# Patient Record
Sex: Female | Born: 1935
Health system: Southern US, Community
[De-identification: ages and names within clinical notes are randomized; demographics above are authoritative.]

## PROBLEM LIST (undated history)

## (undated) DIAGNOSIS — M8580 Other specified disorders of bone density and structure, unspecified site: Secondary | ICD-10-CM

## (undated) DIAGNOSIS — R42 Dizziness and giddiness: Secondary | ICD-10-CM

## (undated) DIAGNOSIS — K759 Inflammatory liver disease, unspecified: Secondary | ICD-10-CM

## (undated) DIAGNOSIS — N3010 Interstitial cystitis (chronic) without hematuria: Secondary | ICD-10-CM

## (undated) DIAGNOSIS — F419 Anxiety disorder, unspecified: Secondary | ICD-10-CM

## (undated) DIAGNOSIS — F32A Depression, unspecified: Secondary | ICD-10-CM

## (undated) DIAGNOSIS — L239 Allergic contact dermatitis, unspecified cause: Secondary | ICD-10-CM

## (undated) DIAGNOSIS — M1990 Unspecified osteoarthritis, unspecified site: Secondary | ICD-10-CM

## (undated) DIAGNOSIS — S7291XA Unspecified fracture of right femur, initial encounter for closed fracture: Secondary | ICD-10-CM

## (undated) DIAGNOSIS — S32591A Other specified fracture of right pubis, initial encounter for closed fracture: Secondary | ICD-10-CM

## (undated) DIAGNOSIS — F329 Major depressive disorder, single episode, unspecified: Secondary | ICD-10-CM

## (undated) DIAGNOSIS — Z79899 Other long term (current) drug therapy: Secondary | ICD-10-CM

## (undated) DIAGNOSIS — R251 Tremor, unspecified: Secondary | ICD-10-CM

## (undated) DIAGNOSIS — S72009A Fracture of unspecified part of neck of unspecified femur, initial encounter for closed fracture: Secondary | ICD-10-CM

## (undated) HISTORY — DX: Other specified fracture of right pubis, initial encounter for closed fracture: S32.591A

## (undated) HISTORY — PX: TONSILLECTOMY: SHX5217

## (undated) HISTORY — DX: Fracture of unspecified part of neck of unspecified femur, initial encounter for closed fracture: S72.009A

## (undated) HISTORY — DX: Major depressive disorder, single episode, unspecified: F32.9

## (undated) HISTORY — DX: Interstitial cystitis (chronic) without hematuria: N30.10

## (undated) HISTORY — DX: Anxiety disorder, unspecified: F41.9

## (undated) HISTORY — DX: Allergic contact dermatitis, unspecified cause: L23.9

## (undated) HISTORY — DX: Unspecified fracture of right femur, initial encounter for closed fracture: S72.91XA

## (undated) HISTORY — DX: Other specified disorders of bone density and structure, unspecified site: M85.80

## (undated) HISTORY — DX: Tremor, unspecified: R25.1

## (undated) HISTORY — DX: Dizziness and giddiness: R42

## (undated) HISTORY — DX: Inflammatory liver disease, unspecified: K75.9

## (undated) HISTORY — PX: APPENDECTOMY: SHX54

## (undated) HISTORY — DX: Unspecified osteoarthritis, unspecified site: M19.90

## (undated) HISTORY — DX: Depression, unspecified: F32.A

## (undated) HISTORY — DX: Other long term (current) drug therapy: Z79.899

---

## 2013-08-29 HISTORY — PX: FRACTURE SURGERY: SHX138

## 2013-09-03 DIAGNOSIS — N3090 Cystitis, unspecified without hematuria: Secondary | ICD-10-CM | POA: Diagnosis not present

## 2013-09-03 DIAGNOSIS — N390 Urinary tract infection, site not specified: Secondary | ICD-10-CM | POA: Diagnosis not present

## 2013-09-03 DIAGNOSIS — N3941 Urge incontinence: Secondary | ICD-10-CM | POA: Diagnosis not present

## 2013-09-16 DIAGNOSIS — I10 Essential (primary) hypertension: Secondary | ICD-10-CM | POA: Diagnosis not present

## 2013-09-16 DIAGNOSIS — R259 Unspecified abnormal involuntary movements: Secondary | ICD-10-CM | POA: Diagnosis not present

## 2013-09-16 DIAGNOSIS — R5383 Other fatigue: Secondary | ICD-10-CM | POA: Diagnosis not present

## 2013-09-16 DIAGNOSIS — R42 Dizziness and giddiness: Secondary | ICD-10-CM | POA: Diagnosis not present

## 2013-09-16 DIAGNOSIS — R5381 Other malaise: Secondary | ICD-10-CM | POA: Diagnosis not present

## 2013-09-17 DIAGNOSIS — R269 Unspecified abnormalities of gait and mobility: Secondary | ICD-10-CM | POA: Diagnosis not present

## 2013-09-17 DIAGNOSIS — E789 Disorder of lipoprotein metabolism, unspecified: Secondary | ICD-10-CM | POA: Diagnosis not present

## 2013-09-17 DIAGNOSIS — R42 Dizziness and giddiness: Secondary | ICD-10-CM | POA: Diagnosis not present

## 2013-09-17 DIAGNOSIS — Z0000 Encounter for general adult medical examination without abnormal findings: Secondary | ICD-10-CM | POA: Diagnosis not present

## 2013-09-17 DIAGNOSIS — I10 Essential (primary) hypertension: Secondary | ICD-10-CM | POA: Diagnosis not present

## 2013-09-18 DIAGNOSIS — R269 Unspecified abnormalities of gait and mobility: Secondary | ICD-10-CM | POA: Diagnosis not present

## 2013-09-18 DIAGNOSIS — Z0000 Encounter for general adult medical examination without abnormal findings: Secondary | ICD-10-CM | POA: Diagnosis not present

## 2013-09-18 DIAGNOSIS — I10 Essential (primary) hypertension: Secondary | ICD-10-CM | POA: Diagnosis not present

## 2013-09-18 DIAGNOSIS — E789 Disorder of lipoprotein metabolism, unspecified: Secondary | ICD-10-CM | POA: Diagnosis not present

## 2013-09-18 DIAGNOSIS — R42 Dizziness and giddiness: Secondary | ICD-10-CM | POA: Diagnosis not present

## 2013-09-30 DIAGNOSIS — E538 Deficiency of other specified B group vitamins: Secondary | ICD-10-CM | POA: Diagnosis not present

## 2013-09-30 DIAGNOSIS — N183 Chronic kidney disease, stage 3 unspecified: Secondary | ICD-10-CM | POA: Diagnosis not present

## 2013-09-30 DIAGNOSIS — R5381 Other malaise: Secondary | ICD-10-CM | POA: Diagnosis not present

## 2013-09-30 DIAGNOSIS — I10 Essential (primary) hypertension: Secondary | ICD-10-CM | POA: Diagnosis not present

## 2013-10-08 DIAGNOSIS — N3090 Cystitis, unspecified without hematuria: Secondary | ICD-10-CM | POA: Diagnosis not present

## 2013-10-22 HISTORY — PX: HIP ORIF W/ CAPSULOTOMY: SHX1756

## 2013-10-28 DIAGNOSIS — R5381 Other malaise: Secondary | ICD-10-CM | POA: Diagnosis not present

## 2013-10-28 DIAGNOSIS — M25559 Pain in unspecified hip: Secondary | ICD-10-CM | POA: Diagnosis not present

## 2013-10-28 DIAGNOSIS — R5383 Other fatigue: Secondary | ICD-10-CM | POA: Diagnosis not present

## 2013-10-28 DIAGNOSIS — I6529 Occlusion and stenosis of unspecified carotid artery: Secondary | ICD-10-CM | POA: Diagnosis not present

## 2013-10-28 DIAGNOSIS — R259 Unspecified abnormal involuntary movements: Secondary | ICD-10-CM | POA: Diagnosis not present

## 2013-10-29 DIAGNOSIS — S72033A Displaced midcervical fracture of unspecified femur, initial encounter for closed fracture: Secondary | ICD-10-CM | POA: Diagnosis not present

## 2013-10-29 DIAGNOSIS — R5383 Other fatigue: Secondary | ICD-10-CM | POA: Diagnosis not present

## 2013-10-29 DIAGNOSIS — S72009A Fracture of unspecified part of neck of unspecified femur, initial encounter for closed fracture: Secondary | ICD-10-CM | POA: Diagnosis not present

## 2013-10-29 DIAGNOSIS — R5381 Other malaise: Secondary | ICD-10-CM | POA: Diagnosis not present

## 2013-10-29 DIAGNOSIS — M25559 Pain in unspecified hip: Secondary | ICD-10-CM | POA: Diagnosis not present

## 2013-10-29 DIAGNOSIS — N183 Chronic kidney disease, stage 3 unspecified: Secondary | ICD-10-CM | POA: Diagnosis not present

## 2013-10-29 DIAGNOSIS — E538 Deficiency of other specified B group vitamins: Secondary | ICD-10-CM | POA: Diagnosis not present

## 2013-10-29 DIAGNOSIS — I10 Essential (primary) hypertension: Secondary | ICD-10-CM | POA: Diagnosis not present

## 2013-10-30 DIAGNOSIS — I10 Essential (primary) hypertension: Secondary | ICD-10-CM | POA: Diagnosis present

## 2013-10-30 DIAGNOSIS — M810 Age-related osteoporosis without current pathological fracture: Secondary | ICD-10-CM | POA: Diagnosis present

## 2013-10-30 DIAGNOSIS — S72033A Displaced midcervical fracture of unspecified femur, initial encounter for closed fracture: Secondary | ICD-10-CM | POA: Diagnosis not present

## 2013-10-30 DIAGNOSIS — N318 Other neuromuscular dysfunction of bladder: Secondary | ICD-10-CM | POA: Diagnosis present

## 2013-10-30 DIAGNOSIS — M25559 Pain in unspecified hip: Secondary | ICD-10-CM | POA: Diagnosis not present

## 2013-10-30 DIAGNOSIS — I2510 Atherosclerotic heart disease of native coronary artery without angina pectoris: Secondary | ICD-10-CM | POA: Diagnosis present

## 2013-10-30 DIAGNOSIS — R259 Unspecified abnormal involuntary movements: Secondary | ICD-10-CM | POA: Diagnosis not present

## 2013-10-30 DIAGNOSIS — S7290XA Unspecified fracture of unspecified femur, initial encounter for closed fracture: Secondary | ICD-10-CM | POA: Diagnosis not present

## 2013-10-30 DIAGNOSIS — S72009A Fracture of unspecified part of neck of unspecified femur, initial encounter for closed fracture: Secondary | ICD-10-CM | POA: Diagnosis not present

## 2013-10-30 DIAGNOSIS — M6281 Muscle weakness (generalized): Secondary | ICD-10-CM | POA: Diagnosis not present

## 2013-10-30 DIAGNOSIS — N289 Disorder of kidney and ureter, unspecified: Secondary | ICD-10-CM | POA: Diagnosis not present

## 2013-11-01 DIAGNOSIS — S72043A Displaced fracture of base of neck of unspecified femur, initial encounter for closed fracture: Secondary | ICD-10-CM | POA: Diagnosis not present

## 2013-11-01 DIAGNOSIS — B961 Klebsiella pneumoniae [K. pneumoniae] as the cause of diseases classified elsewhere: Secondary | ICD-10-CM | POA: Diagnosis not present

## 2013-11-01 DIAGNOSIS — R259 Unspecified abnormal involuntary movements: Secondary | ICD-10-CM | POA: Diagnosis not present

## 2013-11-01 DIAGNOSIS — N289 Disorder of kidney and ureter, unspecified: Secondary | ICD-10-CM | POA: Diagnosis not present

## 2013-11-01 DIAGNOSIS — M6281 Muscle weakness (generalized): Secondary | ICD-10-CM | POA: Diagnosis not present

## 2013-11-01 DIAGNOSIS — N3941 Urge incontinence: Secondary | ICD-10-CM | POA: Diagnosis not present

## 2013-11-01 DIAGNOSIS — I2510 Atherosclerotic heart disease of native coronary artery without angina pectoris: Secondary | ICD-10-CM | POA: Diagnosis present

## 2013-11-01 DIAGNOSIS — Z9181 History of falling: Secondary | ICD-10-CM | POA: Diagnosis not present

## 2013-11-01 DIAGNOSIS — M25559 Pain in unspecified hip: Secondary | ICD-10-CM | POA: Diagnosis not present

## 2013-11-01 DIAGNOSIS — I10 Essential (primary) hypertension: Secondary | ICD-10-CM | POA: Diagnosis present

## 2013-11-01 DIAGNOSIS — G43909 Migraine, unspecified, not intractable, without status migrainosus: Secondary | ICD-10-CM | POA: Diagnosis present

## 2013-11-01 DIAGNOSIS — S72009D Fracture of unspecified part of neck of unspecified femur, subsequent encounter for closed fracture with routine healing: Secondary | ICD-10-CM | POA: Diagnosis not present

## 2013-11-01 DIAGNOSIS — Z5189 Encounter for other specified aftercare: Secondary | ICD-10-CM | POA: Diagnosis not present

## 2013-11-01 DIAGNOSIS — G250 Essential tremor: Secondary | ICD-10-CM | POA: Diagnosis present

## 2013-11-01 DIAGNOSIS — M436 Torticollis: Secondary | ICD-10-CM | POA: Diagnosis not present

## 2013-11-01 DIAGNOSIS — K5900 Constipation, unspecified: Secondary | ICD-10-CM | POA: Diagnosis present

## 2013-11-01 DIAGNOSIS — IMO0002 Reserved for concepts with insufficient information to code with codable children: Secondary | ICD-10-CM | POA: Diagnosis not present

## 2013-11-01 DIAGNOSIS — F411 Generalized anxiety disorder: Secondary | ICD-10-CM | POA: Diagnosis not present

## 2013-11-01 DIAGNOSIS — I6529 Occlusion and stenosis of unspecified carotid artery: Secondary | ICD-10-CM | POA: Diagnosis present

## 2013-11-01 DIAGNOSIS — N318 Other neuromuscular dysfunction of bladder: Secondary | ICD-10-CM | POA: Diagnosis present

## 2013-11-01 DIAGNOSIS — E538 Deficiency of other specified B group vitamins: Secondary | ICD-10-CM | POA: Diagnosis present

## 2013-11-01 DIAGNOSIS — H8110 Benign paroxysmal vertigo, unspecified ear: Secondary | ICD-10-CM | POA: Diagnosis present

## 2013-11-01 DIAGNOSIS — N390 Urinary tract infection, site not specified: Secondary | ICD-10-CM | POA: Diagnosis not present

## 2013-11-01 DIAGNOSIS — S72019A Unspecified intracapsular fracture of unspecified femur, initial encounter for closed fracture: Secondary | ICD-10-CM | POA: Diagnosis not present

## 2013-11-01 DIAGNOSIS — M810 Age-related osteoporosis without current pathological fracture: Secondary | ICD-10-CM | POA: Diagnosis present

## 2013-11-18 DIAGNOSIS — S72043A Displaced fracture of base of neck of unspecified femur, initial encounter for closed fracture: Secondary | ICD-10-CM | POA: Diagnosis not present

## 2013-11-19 DIAGNOSIS — G250 Essential tremor: Secondary | ICD-10-CM | POA: Diagnosis not present

## 2013-11-19 DIAGNOSIS — S72009A Fracture of unspecified part of neck of unspecified femur, initial encounter for closed fracture: Secondary | ICD-10-CM | POA: Diagnosis not present

## 2013-11-19 DIAGNOSIS — I10 Essential (primary) hypertension: Secondary | ICD-10-CM | POA: Diagnosis not present

## 2013-11-19 DIAGNOSIS — M810 Age-related osteoporosis without current pathological fracture: Secondary | ICD-10-CM | POA: Diagnosis not present

## 2013-11-19 DIAGNOSIS — E539 Vitamin B deficiency, unspecified: Secondary | ICD-10-CM | POA: Diagnosis not present

## 2013-11-19 DIAGNOSIS — I6529 Occlusion and stenosis of unspecified carotid artery: Secondary | ICD-10-CM | POA: Diagnosis not present

## 2013-11-19 DIAGNOSIS — R269 Unspecified abnormalities of gait and mobility: Secondary | ICD-10-CM | POA: Diagnosis not present

## 2013-11-19 DIAGNOSIS — S72009D Fracture of unspecified part of neck of unspecified femur, subsequent encounter for closed fracture with routine healing: Secondary | ICD-10-CM | POA: Diagnosis not present

## 2013-11-19 DIAGNOSIS — F411 Generalized anxiety disorder: Secondary | ICD-10-CM | POA: Diagnosis not present

## 2013-11-20 DIAGNOSIS — I10 Essential (primary) hypertension: Secondary | ICD-10-CM | POA: Diagnosis not present

## 2013-11-20 DIAGNOSIS — F411 Generalized anxiety disorder: Secondary | ICD-10-CM | POA: Diagnosis not present

## 2013-11-20 DIAGNOSIS — I6529 Occlusion and stenosis of unspecified carotid artery: Secondary | ICD-10-CM | POA: Diagnosis not present

## 2013-11-20 DIAGNOSIS — R269 Unspecified abnormalities of gait and mobility: Secondary | ICD-10-CM | POA: Diagnosis not present

## 2013-11-20 DIAGNOSIS — M810 Age-related osteoporosis without current pathological fracture: Secondary | ICD-10-CM | POA: Diagnosis not present

## 2013-11-20 DIAGNOSIS — S72009D Fracture of unspecified part of neck of unspecified femur, subsequent encounter for closed fracture with routine healing: Secondary | ICD-10-CM | POA: Diagnosis not present

## 2013-11-21 DIAGNOSIS — F411 Generalized anxiety disorder: Secondary | ICD-10-CM | POA: Diagnosis not present

## 2013-11-21 DIAGNOSIS — S72009D Fracture of unspecified part of neck of unspecified femur, subsequent encounter for closed fracture with routine healing: Secondary | ICD-10-CM | POA: Diagnosis not present

## 2013-11-21 DIAGNOSIS — M810 Age-related osteoporosis without current pathological fracture: Secondary | ICD-10-CM | POA: Diagnosis not present

## 2013-11-21 DIAGNOSIS — I10 Essential (primary) hypertension: Secondary | ICD-10-CM | POA: Diagnosis not present

## 2013-11-21 DIAGNOSIS — R269 Unspecified abnormalities of gait and mobility: Secondary | ICD-10-CM | POA: Diagnosis not present

## 2013-11-21 DIAGNOSIS — I6529 Occlusion and stenosis of unspecified carotid artery: Secondary | ICD-10-CM | POA: Diagnosis not present

## 2013-11-22 DIAGNOSIS — S72009D Fracture of unspecified part of neck of unspecified femur, subsequent encounter for closed fracture with routine healing: Secondary | ICD-10-CM | POA: Diagnosis not present

## 2013-11-22 DIAGNOSIS — I10 Essential (primary) hypertension: Secondary | ICD-10-CM | POA: Diagnosis not present

## 2013-11-22 DIAGNOSIS — R269 Unspecified abnormalities of gait and mobility: Secondary | ICD-10-CM | POA: Diagnosis not present

## 2013-11-22 DIAGNOSIS — M810 Age-related osteoporosis without current pathological fracture: Secondary | ICD-10-CM | POA: Diagnosis not present

## 2013-11-22 DIAGNOSIS — I6529 Occlusion and stenosis of unspecified carotid artery: Secondary | ICD-10-CM | POA: Diagnosis not present

## 2013-11-22 DIAGNOSIS — F411 Generalized anxiety disorder: Secondary | ICD-10-CM | POA: Diagnosis not present

## 2013-11-26 DIAGNOSIS — I10 Essential (primary) hypertension: Secondary | ICD-10-CM | POA: Diagnosis not present

## 2013-11-26 DIAGNOSIS — S72009D Fracture of unspecified part of neck of unspecified femur, subsequent encounter for closed fracture with routine healing: Secondary | ICD-10-CM | POA: Diagnosis not present

## 2013-11-26 DIAGNOSIS — F411 Generalized anxiety disorder: Secondary | ICD-10-CM | POA: Diagnosis not present

## 2013-11-26 DIAGNOSIS — R269 Unspecified abnormalities of gait and mobility: Secondary | ICD-10-CM | POA: Diagnosis not present

## 2013-11-26 DIAGNOSIS — I6529 Occlusion and stenosis of unspecified carotid artery: Secondary | ICD-10-CM | POA: Diagnosis not present

## 2013-11-26 DIAGNOSIS — M810 Age-related osteoporosis without current pathological fracture: Secondary | ICD-10-CM | POA: Diagnosis not present

## 2013-11-27 DIAGNOSIS — S72009D Fracture of unspecified part of neck of unspecified femur, subsequent encounter for closed fracture with routine healing: Secondary | ICD-10-CM | POA: Diagnosis not present

## 2013-11-27 DIAGNOSIS — R269 Unspecified abnormalities of gait and mobility: Secondary | ICD-10-CM | POA: Diagnosis not present

## 2013-11-27 DIAGNOSIS — F411 Generalized anxiety disorder: Secondary | ICD-10-CM | POA: Diagnosis not present

## 2013-11-27 DIAGNOSIS — I6529 Occlusion and stenosis of unspecified carotid artery: Secondary | ICD-10-CM | POA: Diagnosis not present

## 2013-11-27 DIAGNOSIS — I10 Essential (primary) hypertension: Secondary | ICD-10-CM | POA: Diagnosis not present

## 2013-11-27 DIAGNOSIS — M810 Age-related osteoporosis without current pathological fracture: Secondary | ICD-10-CM | POA: Diagnosis not present

## 2013-11-28 DIAGNOSIS — R269 Unspecified abnormalities of gait and mobility: Secondary | ICD-10-CM | POA: Diagnosis not present

## 2013-11-28 DIAGNOSIS — M810 Age-related osteoporosis without current pathological fracture: Secondary | ICD-10-CM | POA: Diagnosis not present

## 2013-11-28 DIAGNOSIS — S72009D Fracture of unspecified part of neck of unspecified femur, subsequent encounter for closed fracture with routine healing: Secondary | ICD-10-CM | POA: Diagnosis not present

## 2013-11-28 DIAGNOSIS — F411 Generalized anxiety disorder: Secondary | ICD-10-CM | POA: Diagnosis not present

## 2013-11-28 DIAGNOSIS — I6529 Occlusion and stenosis of unspecified carotid artery: Secondary | ICD-10-CM | POA: Diagnosis not present

## 2013-11-28 DIAGNOSIS — I10 Essential (primary) hypertension: Secondary | ICD-10-CM | POA: Diagnosis not present

## 2013-11-29 DIAGNOSIS — M810 Age-related osteoporosis without current pathological fracture: Secondary | ICD-10-CM | POA: Diagnosis not present

## 2013-11-29 DIAGNOSIS — I6529 Occlusion and stenosis of unspecified carotid artery: Secondary | ICD-10-CM | POA: Diagnosis not present

## 2013-11-29 DIAGNOSIS — R269 Unspecified abnormalities of gait and mobility: Secondary | ICD-10-CM | POA: Diagnosis not present

## 2013-11-29 DIAGNOSIS — I10 Essential (primary) hypertension: Secondary | ICD-10-CM | POA: Diagnosis not present

## 2013-11-29 DIAGNOSIS — F411 Generalized anxiety disorder: Secondary | ICD-10-CM | POA: Diagnosis not present

## 2013-11-29 DIAGNOSIS — S72009D Fracture of unspecified part of neck of unspecified femur, subsequent encounter for closed fracture with routine healing: Secondary | ICD-10-CM | POA: Diagnosis not present

## 2013-12-02 DIAGNOSIS — I6529 Occlusion and stenosis of unspecified carotid artery: Secondary | ICD-10-CM | POA: Diagnosis not present

## 2013-12-02 DIAGNOSIS — R269 Unspecified abnormalities of gait and mobility: Secondary | ICD-10-CM | POA: Diagnosis not present

## 2013-12-02 DIAGNOSIS — F411 Generalized anxiety disorder: Secondary | ICD-10-CM | POA: Diagnosis not present

## 2013-12-02 DIAGNOSIS — I10 Essential (primary) hypertension: Secondary | ICD-10-CM | POA: Diagnosis not present

## 2013-12-02 DIAGNOSIS — S72009D Fracture of unspecified part of neck of unspecified femur, subsequent encounter for closed fracture with routine healing: Secondary | ICD-10-CM | POA: Diagnosis not present

## 2013-12-02 DIAGNOSIS — M810 Age-related osteoporosis without current pathological fracture: Secondary | ICD-10-CM | POA: Diagnosis not present

## 2013-12-03 DIAGNOSIS — S72009D Fracture of unspecified part of neck of unspecified femur, subsequent encounter for closed fracture with routine healing: Secondary | ICD-10-CM | POA: Diagnosis not present

## 2013-12-03 DIAGNOSIS — I6529 Occlusion and stenosis of unspecified carotid artery: Secondary | ICD-10-CM | POA: Diagnosis not present

## 2013-12-03 DIAGNOSIS — M810 Age-related osteoporosis without current pathological fracture: Secondary | ICD-10-CM | POA: Diagnosis not present

## 2013-12-03 DIAGNOSIS — I10 Essential (primary) hypertension: Secondary | ICD-10-CM | POA: Diagnosis not present

## 2013-12-03 DIAGNOSIS — F411 Generalized anxiety disorder: Secondary | ICD-10-CM | POA: Diagnosis not present

## 2013-12-03 DIAGNOSIS — R269 Unspecified abnormalities of gait and mobility: Secondary | ICD-10-CM | POA: Diagnosis not present

## 2013-12-05 DIAGNOSIS — S72009D Fracture of unspecified part of neck of unspecified femur, subsequent encounter for closed fracture with routine healing: Secondary | ICD-10-CM | POA: Diagnosis not present

## 2013-12-05 DIAGNOSIS — M810 Age-related osteoporosis without current pathological fracture: Secondary | ICD-10-CM | POA: Diagnosis not present

## 2013-12-05 DIAGNOSIS — I6529 Occlusion and stenosis of unspecified carotid artery: Secondary | ICD-10-CM | POA: Diagnosis not present

## 2013-12-05 DIAGNOSIS — I10 Essential (primary) hypertension: Secondary | ICD-10-CM | POA: Diagnosis not present

## 2013-12-05 DIAGNOSIS — F411 Generalized anxiety disorder: Secondary | ICD-10-CM | POA: Diagnosis not present

## 2013-12-05 DIAGNOSIS — R269 Unspecified abnormalities of gait and mobility: Secondary | ICD-10-CM | POA: Diagnosis not present

## 2013-12-06 DIAGNOSIS — S72009D Fracture of unspecified part of neck of unspecified femur, subsequent encounter for closed fracture with routine healing: Secondary | ICD-10-CM | POA: Diagnosis not present

## 2013-12-06 DIAGNOSIS — M810 Age-related osteoporosis without current pathological fracture: Secondary | ICD-10-CM | POA: Diagnosis not present

## 2013-12-06 DIAGNOSIS — F411 Generalized anxiety disorder: Secondary | ICD-10-CM | POA: Diagnosis not present

## 2013-12-06 DIAGNOSIS — I6529 Occlusion and stenosis of unspecified carotid artery: Secondary | ICD-10-CM | POA: Diagnosis not present

## 2013-12-06 DIAGNOSIS — N3040 Irradiation cystitis without hematuria: Secondary | ICD-10-CM | POA: Diagnosis not present

## 2013-12-06 DIAGNOSIS — N390 Urinary tract infection, site not specified: Secondary | ICD-10-CM | POA: Diagnosis not present

## 2013-12-06 DIAGNOSIS — I10 Essential (primary) hypertension: Secondary | ICD-10-CM | POA: Diagnosis not present

## 2013-12-06 DIAGNOSIS — R269 Unspecified abnormalities of gait and mobility: Secondary | ICD-10-CM | POA: Diagnosis not present

## 2013-12-09 DIAGNOSIS — I6529 Occlusion and stenosis of unspecified carotid artery: Secondary | ICD-10-CM | POA: Diagnosis not present

## 2013-12-09 DIAGNOSIS — F411 Generalized anxiety disorder: Secondary | ICD-10-CM | POA: Diagnosis not present

## 2013-12-09 DIAGNOSIS — M810 Age-related osteoporosis without current pathological fracture: Secondary | ICD-10-CM | POA: Diagnosis not present

## 2013-12-09 DIAGNOSIS — R269 Unspecified abnormalities of gait and mobility: Secondary | ICD-10-CM | POA: Diagnosis not present

## 2013-12-09 DIAGNOSIS — I10 Essential (primary) hypertension: Secondary | ICD-10-CM | POA: Diagnosis not present

## 2013-12-09 DIAGNOSIS — S72009D Fracture of unspecified part of neck of unspecified femur, subsequent encounter for closed fracture with routine healing: Secondary | ICD-10-CM | POA: Diagnosis not present

## 2013-12-11 DIAGNOSIS — F411 Generalized anxiety disorder: Secondary | ICD-10-CM | POA: Diagnosis not present

## 2013-12-11 DIAGNOSIS — I6529 Occlusion and stenosis of unspecified carotid artery: Secondary | ICD-10-CM | POA: Diagnosis not present

## 2013-12-11 DIAGNOSIS — S72009D Fracture of unspecified part of neck of unspecified femur, subsequent encounter for closed fracture with routine healing: Secondary | ICD-10-CM | POA: Diagnosis not present

## 2013-12-11 DIAGNOSIS — I10 Essential (primary) hypertension: Secondary | ICD-10-CM | POA: Diagnosis not present

## 2013-12-11 DIAGNOSIS — R269 Unspecified abnormalities of gait and mobility: Secondary | ICD-10-CM | POA: Diagnosis not present

## 2013-12-11 DIAGNOSIS — M810 Age-related osteoporosis without current pathological fracture: Secondary | ICD-10-CM | POA: Diagnosis not present

## 2013-12-12 DIAGNOSIS — F411 Generalized anxiety disorder: Secondary | ICD-10-CM | POA: Diagnosis not present

## 2013-12-12 DIAGNOSIS — R269 Unspecified abnormalities of gait and mobility: Secondary | ICD-10-CM | POA: Diagnosis not present

## 2013-12-12 DIAGNOSIS — I6529 Occlusion and stenosis of unspecified carotid artery: Secondary | ICD-10-CM | POA: Diagnosis not present

## 2013-12-12 DIAGNOSIS — M810 Age-related osteoporosis without current pathological fracture: Secondary | ICD-10-CM | POA: Diagnosis not present

## 2013-12-12 DIAGNOSIS — I10 Essential (primary) hypertension: Secondary | ICD-10-CM | POA: Diagnosis not present

## 2013-12-12 DIAGNOSIS — S72009D Fracture of unspecified part of neck of unspecified femur, subsequent encounter for closed fracture with routine healing: Secondary | ICD-10-CM | POA: Diagnosis not present

## 2013-12-13 DIAGNOSIS — F411 Generalized anxiety disorder: Secondary | ICD-10-CM | POA: Diagnosis not present

## 2013-12-13 DIAGNOSIS — M810 Age-related osteoporosis without current pathological fracture: Secondary | ICD-10-CM | POA: Diagnosis not present

## 2013-12-13 DIAGNOSIS — I6529 Occlusion and stenosis of unspecified carotid artery: Secondary | ICD-10-CM | POA: Diagnosis not present

## 2013-12-13 DIAGNOSIS — S72009D Fracture of unspecified part of neck of unspecified femur, subsequent encounter for closed fracture with routine healing: Secondary | ICD-10-CM | POA: Diagnosis not present

## 2013-12-13 DIAGNOSIS — R269 Unspecified abnormalities of gait and mobility: Secondary | ICD-10-CM | POA: Diagnosis not present

## 2013-12-13 DIAGNOSIS — I10 Essential (primary) hypertension: Secondary | ICD-10-CM | POA: Diagnosis not present

## 2013-12-17 DIAGNOSIS — S72043A Displaced fracture of base of neck of unspecified femur, initial encounter for closed fracture: Secondary | ICD-10-CM | POA: Diagnosis not present

## 2013-12-18 DIAGNOSIS — I10 Essential (primary) hypertension: Secondary | ICD-10-CM | POA: Diagnosis not present

## 2013-12-18 DIAGNOSIS — R269 Unspecified abnormalities of gait and mobility: Secondary | ICD-10-CM | POA: Diagnosis not present

## 2013-12-18 DIAGNOSIS — I6529 Occlusion and stenosis of unspecified carotid artery: Secondary | ICD-10-CM | POA: Diagnosis not present

## 2013-12-18 DIAGNOSIS — S72009D Fracture of unspecified part of neck of unspecified femur, subsequent encounter for closed fracture with routine healing: Secondary | ICD-10-CM | POA: Diagnosis not present

## 2013-12-18 DIAGNOSIS — F411 Generalized anxiety disorder: Secondary | ICD-10-CM | POA: Diagnosis not present

## 2013-12-18 DIAGNOSIS — M810 Age-related osteoporosis without current pathological fracture: Secondary | ICD-10-CM | POA: Diagnosis not present

## 2013-12-20 DIAGNOSIS — N390 Urinary tract infection, site not specified: Secondary | ICD-10-CM | POA: Diagnosis not present

## 2013-12-20 DIAGNOSIS — M810 Age-related osteoporosis without current pathological fracture: Secondary | ICD-10-CM | POA: Diagnosis not present

## 2013-12-20 DIAGNOSIS — R269 Unspecified abnormalities of gait and mobility: Secondary | ICD-10-CM | POA: Diagnosis not present

## 2013-12-20 DIAGNOSIS — I6529 Occlusion and stenosis of unspecified carotid artery: Secondary | ICD-10-CM | POA: Diagnosis not present

## 2013-12-20 DIAGNOSIS — N3090 Cystitis, unspecified without hematuria: Secondary | ICD-10-CM | POA: Diagnosis not present

## 2013-12-20 DIAGNOSIS — F411 Generalized anxiety disorder: Secondary | ICD-10-CM | POA: Diagnosis not present

## 2013-12-20 DIAGNOSIS — I10 Essential (primary) hypertension: Secondary | ICD-10-CM | POA: Diagnosis not present

## 2013-12-20 DIAGNOSIS — S72009D Fracture of unspecified part of neck of unspecified femur, subsequent encounter for closed fracture with routine healing: Secondary | ICD-10-CM | POA: Diagnosis not present

## 2013-12-23 DIAGNOSIS — R269 Unspecified abnormalities of gait and mobility: Secondary | ICD-10-CM | POA: Diagnosis not present

## 2013-12-23 DIAGNOSIS — F411 Generalized anxiety disorder: Secondary | ICD-10-CM | POA: Diagnosis not present

## 2013-12-23 DIAGNOSIS — I6529 Occlusion and stenosis of unspecified carotid artery: Secondary | ICD-10-CM | POA: Diagnosis not present

## 2013-12-23 DIAGNOSIS — M810 Age-related osteoporosis without current pathological fracture: Secondary | ICD-10-CM | POA: Diagnosis not present

## 2013-12-23 DIAGNOSIS — S72009D Fracture of unspecified part of neck of unspecified femur, subsequent encounter for closed fracture with routine healing: Secondary | ICD-10-CM | POA: Diagnosis not present

## 2013-12-23 DIAGNOSIS — I10 Essential (primary) hypertension: Secondary | ICD-10-CM | POA: Diagnosis not present

## 2013-12-30 DIAGNOSIS — R269 Unspecified abnormalities of gait and mobility: Secondary | ICD-10-CM | POA: Diagnosis not present

## 2013-12-30 DIAGNOSIS — M25569 Pain in unspecified knee: Secondary | ICD-10-CM | POA: Diagnosis not present

## 2013-12-30 DIAGNOSIS — E7800 Pure hypercholesterolemia, unspecified: Secondary | ICD-10-CM | POA: Diagnosis not present

## 2014-01-28 DIAGNOSIS — S72043A Displaced fracture of base of neck of unspecified femur, initial encounter for closed fracture: Secondary | ICD-10-CM | POA: Diagnosis not present

## 2014-01-28 DIAGNOSIS — R259 Unspecified abnormal involuntary movements: Secondary | ICD-10-CM | POA: Diagnosis not present

## 2014-01-28 DIAGNOSIS — R079 Chest pain, unspecified: Secondary | ICD-10-CM | POA: Diagnosis not present

## 2014-01-28 DIAGNOSIS — R5381 Other malaise: Secondary | ICD-10-CM | POA: Diagnosis not present

## 2014-01-28 DIAGNOSIS — R5383 Other fatigue: Secondary | ICD-10-CM | POA: Diagnosis not present

## 2014-01-30 DIAGNOSIS — R5383 Other fatigue: Secondary | ICD-10-CM | POA: Diagnosis not present

## 2014-01-30 DIAGNOSIS — R5381 Other malaise: Secondary | ICD-10-CM | POA: Diagnosis not present

## 2014-01-31 DIAGNOSIS — N3090 Cystitis, unspecified without hematuria: Secondary | ICD-10-CM | POA: Diagnosis not present

## 2014-01-31 DIAGNOSIS — N3941 Urge incontinence: Secondary | ICD-10-CM | POA: Diagnosis not present

## 2014-02-06 DIAGNOSIS — I658 Occlusion and stenosis of other precerebral arteries: Secondary | ICD-10-CM | POA: Diagnosis not present

## 2014-02-06 DIAGNOSIS — Z1231 Encounter for screening mammogram for malignant neoplasm of breast: Secondary | ICD-10-CM | POA: Diagnosis not present

## 2014-02-06 DIAGNOSIS — I6529 Occlusion and stenosis of unspecified carotid artery: Secondary | ICD-10-CM | POA: Diagnosis not present

## 2014-03-07 DIAGNOSIS — N3010 Interstitial cystitis (chronic) without hematuria: Secondary | ICD-10-CM | POA: Diagnosis not present

## 2014-03-07 DIAGNOSIS — N3090 Cystitis, unspecified without hematuria: Secondary | ICD-10-CM | POA: Diagnosis not present

## 2014-03-07 DIAGNOSIS — N3941 Urge incontinence: Secondary | ICD-10-CM | POA: Diagnosis not present

## 2014-03-10 DIAGNOSIS — N390 Urinary tract infection, site not specified: Secondary | ICD-10-CM | POA: Diagnosis not present

## 2014-03-10 DIAGNOSIS — N3010 Interstitial cystitis (chronic) without hematuria: Secondary | ICD-10-CM | POA: Diagnosis not present

## 2014-03-13 DIAGNOSIS — R4181 Age-related cognitive decline: Secondary | ICD-10-CM | POA: Diagnosis not present

## 2014-03-13 DIAGNOSIS — R634 Abnormal weight loss: Secondary | ICD-10-CM | POA: Diagnosis not present

## 2014-03-13 DIAGNOSIS — R259 Unspecified abnormal involuntary movements: Secondary | ICD-10-CM | POA: Diagnosis not present

## 2014-03-19 DIAGNOSIS — I6529 Occlusion and stenosis of unspecified carotid artery: Secondary | ICD-10-CM | POA: Diagnosis not present

## 2014-04-18 DIAGNOSIS — N3941 Urge incontinence: Secondary | ICD-10-CM | POA: Diagnosis not present

## 2014-04-18 DIAGNOSIS — N3090 Cystitis, unspecified without hematuria: Secondary | ICD-10-CM | POA: Diagnosis not present

## 2014-04-23 DIAGNOSIS — R209 Unspecified disturbances of skin sensation: Secondary | ICD-10-CM | POA: Diagnosis not present

## 2014-05-01 DIAGNOSIS — R209 Unspecified disturbances of skin sensation: Secondary | ICD-10-CM | POA: Diagnosis not present

## 2014-05-01 DIAGNOSIS — Z79899 Other long term (current) drug therapy: Secondary | ICD-10-CM | POA: Diagnosis not present

## 2014-05-09 DIAGNOSIS — R209 Unspecified disturbances of skin sensation: Secondary | ICD-10-CM | POA: Diagnosis not present

## 2014-05-16 DIAGNOSIS — R209 Unspecified disturbances of skin sensation: Secondary | ICD-10-CM | POA: Diagnosis not present

## 2014-05-21 DIAGNOSIS — IMO0002 Reserved for concepts with insufficient information to code with codable children: Secondary | ICD-10-CM | POA: Diagnosis not present

## 2014-05-27 DIAGNOSIS — R209 Unspecified disturbances of skin sensation: Secondary | ICD-10-CM | POA: Diagnosis not present

## 2014-05-27 DIAGNOSIS — R918 Other nonspecific abnormal finding of lung field: Secondary | ICD-10-CM | POA: Diagnosis not present

## 2014-05-27 DIAGNOSIS — IMO0002 Reserved for concepts with insufficient information to code with codable children: Secondary | ICD-10-CM | POA: Diagnosis not present

## 2014-05-27 DIAGNOSIS — R079 Chest pain, unspecified: Secondary | ICD-10-CM | POA: Diagnosis not present

## 2014-05-27 DIAGNOSIS — J449 Chronic obstructive pulmonary disease, unspecified: Secondary | ICD-10-CM | POA: Diagnosis not present

## 2014-05-29 DIAGNOSIS — Z23 Encounter for immunization: Secondary | ICD-10-CM | POA: Diagnosis not present

## 2014-05-30 DIAGNOSIS — N3010 Interstitial cystitis (chronic) without hematuria: Secondary | ICD-10-CM | POA: Diagnosis not present

## 2014-05-30 DIAGNOSIS — N3941 Urge incontinence: Secondary | ICD-10-CM | POA: Diagnosis not present

## 2014-07-11 DIAGNOSIS — N3941 Urge incontinence: Secondary | ICD-10-CM | POA: Diagnosis not present

## 2014-07-11 DIAGNOSIS — N3090 Cystitis, unspecified without hematuria: Secondary | ICD-10-CM | POA: Diagnosis not present

## 2014-07-11 DIAGNOSIS — N3000 Acute cystitis without hematuria: Secondary | ICD-10-CM | POA: Diagnosis not present

## 2014-07-11 DIAGNOSIS — N3010 Interstitial cystitis (chronic) without hematuria: Secondary | ICD-10-CM | POA: Diagnosis not present

## 2014-07-15 DIAGNOSIS — R251 Tremor, unspecified: Secondary | ICD-10-CM | POA: Diagnosis not present

## 2014-09-12 DIAGNOSIS — N3941 Urge incontinence: Secondary | ICD-10-CM | POA: Diagnosis not present

## 2014-09-12 DIAGNOSIS — N3090 Cystitis, unspecified without hematuria: Secondary | ICD-10-CM | POA: Diagnosis not present

## 2014-09-12 DIAGNOSIS — N3010 Interstitial cystitis (chronic) without hematuria: Secondary | ICD-10-CM | POA: Diagnosis not present

## 2014-09-24 DIAGNOSIS — Z0000 Encounter for general adult medical examination without abnormal findings: Secondary | ICD-10-CM | POA: Diagnosis not present

## 2014-09-24 DIAGNOSIS — R079 Chest pain, unspecified: Secondary | ICD-10-CM | POA: Diagnosis not present

## 2014-09-24 DIAGNOSIS — M810 Age-related osteoporosis without current pathological fracture: Secondary | ICD-10-CM | POA: Diagnosis not present

## 2014-09-24 DIAGNOSIS — G250 Essential tremor: Secondary | ICD-10-CM | POA: Diagnosis not present

## 2014-09-24 DIAGNOSIS — N183 Chronic kidney disease, stage 3 (moderate): Secondary | ICD-10-CM | POA: Diagnosis not present

## 2014-09-27 DIAGNOSIS — Z23 Encounter for immunization: Secondary | ICD-10-CM | POA: Diagnosis not present

## 2014-10-03 DIAGNOSIS — Z1211 Encounter for screening for malignant neoplasm of colon: Secondary | ICD-10-CM | POA: Diagnosis not present

## 2014-10-08 DIAGNOSIS — R42 Dizziness and giddiness: Secondary | ICD-10-CM | POA: Diagnosis not present

## 2014-10-08 DIAGNOSIS — R270 Ataxia, unspecified: Secondary | ICD-10-CM | POA: Diagnosis not present

## 2014-10-08 DIAGNOSIS — R531 Weakness: Secondary | ICD-10-CM | POA: Diagnosis not present

## 2014-10-10 DIAGNOSIS — R270 Ataxia, unspecified: Secondary | ICD-10-CM | POA: Diagnosis not present

## 2014-10-10 DIAGNOSIS — R42 Dizziness and giddiness: Secondary | ICD-10-CM | POA: Diagnosis not present

## 2014-10-10 DIAGNOSIS — R531 Weakness: Secondary | ICD-10-CM | POA: Diagnosis not present

## 2014-10-13 DIAGNOSIS — R270 Ataxia, unspecified: Secondary | ICD-10-CM | POA: Diagnosis not present

## 2014-10-13 DIAGNOSIS — R42 Dizziness and giddiness: Secondary | ICD-10-CM | POA: Diagnosis not present

## 2014-10-13 DIAGNOSIS — R531 Weakness: Secondary | ICD-10-CM | POA: Diagnosis not present

## 2014-10-16 DIAGNOSIS — I10 Essential (primary) hypertension: Secondary | ICD-10-CM | POA: Diagnosis not present

## 2014-10-16 DIAGNOSIS — E780 Pure hypercholesterolemia: Secondary | ICD-10-CM | POA: Diagnosis not present

## 2014-10-16 DIAGNOSIS — G009 Bacterial meningitis, unspecified: Secondary | ICD-10-CM | POA: Diagnosis not present

## 2014-10-21 DIAGNOSIS — R531 Weakness: Secondary | ICD-10-CM | POA: Diagnosis not present

## 2014-10-21 DIAGNOSIS — R270 Ataxia, unspecified: Secondary | ICD-10-CM | POA: Diagnosis not present

## 2014-10-21 DIAGNOSIS — R42 Dizziness and giddiness: Secondary | ICD-10-CM | POA: Diagnosis not present

## 2014-10-23 DIAGNOSIS — R030 Elevated blood-pressure reading, without diagnosis of hypertension: Secondary | ICD-10-CM | POA: Diagnosis not present

## 2014-10-23 DIAGNOSIS — R42 Dizziness and giddiness: Secondary | ICD-10-CM | POA: Diagnosis not present

## 2014-10-23 DIAGNOSIS — K921 Melena: Secondary | ICD-10-CM | POA: Diagnosis not present

## 2014-11-13 DIAGNOSIS — R195 Other fecal abnormalities: Secondary | ICD-10-CM | POA: Diagnosis not present

## 2014-11-13 DIAGNOSIS — R194 Change in bowel habit: Secondary | ICD-10-CM | POA: Diagnosis not present

## 2014-11-13 DIAGNOSIS — K5900 Constipation, unspecified: Secondary | ICD-10-CM | POA: Diagnosis not present

## 2014-11-14 DIAGNOSIS — N3941 Urge incontinence: Secondary | ICD-10-CM | POA: Diagnosis not present

## 2014-11-14 DIAGNOSIS — N3091 Cystitis, unspecified with hematuria: Secondary | ICD-10-CM | POA: Diagnosis not present

## 2014-11-25 DIAGNOSIS — L2389 Allergic contact dermatitis due to other agents: Secondary | ICD-10-CM | POA: Diagnosis not present

## 2014-11-25 DIAGNOSIS — L298 Other pruritus: Secondary | ICD-10-CM | POA: Diagnosis not present

## 2014-11-25 DIAGNOSIS — L2081 Atopic neurodermatitis: Secondary | ICD-10-CM | POA: Diagnosis not present

## 2014-11-25 DIAGNOSIS — L233 Allergic contact dermatitis due to drugs in contact with skin: Secondary | ICD-10-CM | POA: Diagnosis not present

## 2014-11-25 DIAGNOSIS — L2084 Intrinsic (allergic) eczema: Secondary | ICD-10-CM | POA: Diagnosis not present

## 2014-11-26 DIAGNOSIS — R351 Nocturia: Secondary | ICD-10-CM | POA: Diagnosis not present

## 2014-11-26 DIAGNOSIS — R3916 Straining to void: Secondary | ICD-10-CM | POA: Diagnosis not present

## 2014-11-26 DIAGNOSIS — N390 Urinary tract infection, site not specified: Secondary | ICD-10-CM | POA: Diagnosis not present

## 2014-11-26 DIAGNOSIS — N3090 Cystitis, unspecified without hematuria: Secondary | ICD-10-CM | POA: Diagnosis not present

## 2014-11-26 DIAGNOSIS — N3281 Overactive bladder: Secondary | ICD-10-CM | POA: Diagnosis not present

## 2014-11-26 DIAGNOSIS — R3915 Urgency of urination: Secondary | ICD-10-CM | POA: Diagnosis not present

## 2014-11-26 DIAGNOSIS — R3914 Feeling of incomplete bladder emptying: Secondary | ICD-10-CM | POA: Diagnosis not present

## 2014-11-27 DIAGNOSIS — N390 Urinary tract infection, site not specified: Secondary | ICD-10-CM | POA: Diagnosis not present

## 2014-11-28 DIAGNOSIS — N390 Urinary tract infection, site not specified: Secondary | ICD-10-CM | POA: Diagnosis not present

## 2014-12-19 DIAGNOSIS — N390 Urinary tract infection, site not specified: Secondary | ICD-10-CM | POA: Diagnosis not present

## 2015-01-01 DIAGNOSIS — N3941 Urge incontinence: Secondary | ICD-10-CM | POA: Diagnosis not present

## 2015-01-01 DIAGNOSIS — N390 Urinary tract infection, site not specified: Secondary | ICD-10-CM | POA: Diagnosis not present

## 2015-01-01 DIAGNOSIS — N3090 Cystitis, unspecified without hematuria: Secondary | ICD-10-CM | POA: Diagnosis not present

## 2015-01-02 DIAGNOSIS — N3090 Cystitis, unspecified without hematuria: Secondary | ICD-10-CM | POA: Diagnosis not present

## 2015-01-02 DIAGNOSIS — N3010 Interstitial cystitis (chronic) without hematuria: Secondary | ICD-10-CM | POA: Diagnosis not present

## 2015-01-05 DIAGNOSIS — N3011 Interstitial cystitis (chronic) with hematuria: Secondary | ICD-10-CM | POA: Diagnosis not present

## 2015-01-05 DIAGNOSIS — N3091 Cystitis, unspecified with hematuria: Secondary | ICD-10-CM | POA: Diagnosis not present

## 2015-01-06 DIAGNOSIS — N3010 Interstitial cystitis (chronic) without hematuria: Secondary | ICD-10-CM | POA: Diagnosis not present

## 2015-01-06 DIAGNOSIS — N3090 Cystitis, unspecified without hematuria: Secondary | ICD-10-CM | POA: Diagnosis not present

## 2015-01-07 DIAGNOSIS — N3090 Cystitis, unspecified without hematuria: Secondary | ICD-10-CM | POA: Diagnosis not present

## 2015-01-07 DIAGNOSIS — N3010 Interstitial cystitis (chronic) without hematuria: Secondary | ICD-10-CM | POA: Diagnosis not present

## 2015-01-08 DIAGNOSIS — N3010 Interstitial cystitis (chronic) without hematuria: Secondary | ICD-10-CM | POA: Diagnosis not present

## 2015-01-08 DIAGNOSIS — N3090 Cystitis, unspecified without hematuria: Secondary | ICD-10-CM | POA: Diagnosis not present

## 2015-01-09 DIAGNOSIS — N3090 Cystitis, unspecified without hematuria: Secondary | ICD-10-CM | POA: Diagnosis not present

## 2015-01-09 DIAGNOSIS — N3010 Interstitial cystitis (chronic) without hematuria: Secondary | ICD-10-CM | POA: Diagnosis not present

## 2015-01-20 DIAGNOSIS — N3091 Cystitis, unspecified with hematuria: Secondary | ICD-10-CM | POA: Diagnosis not present

## 2015-01-21 DIAGNOSIS — N3091 Cystitis, unspecified with hematuria: Secondary | ICD-10-CM | POA: Diagnosis not present

## 2015-01-23 DIAGNOSIS — N3010 Interstitial cystitis (chronic) without hematuria: Secondary | ICD-10-CM | POA: Diagnosis not present

## 2015-01-23 DIAGNOSIS — N3090 Cystitis, unspecified without hematuria: Secondary | ICD-10-CM | POA: Diagnosis not present

## 2015-01-27 DIAGNOSIS — J301 Allergic rhinitis due to pollen: Secondary | ICD-10-CM | POA: Diagnosis not present

## 2015-01-27 DIAGNOSIS — N3011 Interstitial cystitis (chronic) with hematuria: Secondary | ICD-10-CM | POA: Diagnosis not present

## 2015-01-27 DIAGNOSIS — N3090 Cystitis, unspecified without hematuria: Secondary | ICD-10-CM | POA: Diagnosis not present

## 2015-01-27 DIAGNOSIS — N3091 Cystitis, unspecified with hematuria: Secondary | ICD-10-CM | POA: Diagnosis not present

## 2015-01-30 DIAGNOSIS — N3010 Interstitial cystitis (chronic) without hematuria: Secondary | ICD-10-CM | POA: Diagnosis not present

## 2015-02-11 DIAGNOSIS — L209 Atopic dermatitis, unspecified: Secondary | ICD-10-CM | POA: Diagnosis not present

## 2015-02-11 DIAGNOSIS — L501 Idiopathic urticaria: Secondary | ICD-10-CM | POA: Diagnosis not present

## 2015-02-11 DIAGNOSIS — J301 Allergic rhinitis due to pollen: Secondary | ICD-10-CM | POA: Diagnosis not present

## 2015-02-24 DIAGNOSIS — N3010 Interstitial cystitis (chronic) without hematuria: Secondary | ICD-10-CM | POA: Diagnosis not present

## 2015-02-24 DIAGNOSIS — N3941 Urge incontinence: Secondary | ICD-10-CM | POA: Diagnosis not present

## 2015-02-24 DIAGNOSIS — N3090 Cystitis, unspecified without hematuria: Secondary | ICD-10-CM | POA: Diagnosis not present

## 2015-02-24 DIAGNOSIS — N390 Urinary tract infection, site not specified: Secondary | ICD-10-CM | POA: Diagnosis not present

## 2015-03-04 DIAGNOSIS — Z1231 Encounter for screening mammogram for malignant neoplasm of breast: Secondary | ICD-10-CM | POA: Diagnosis not present

## 2015-03-04 DIAGNOSIS — M8588 Other specified disorders of bone density and structure, other site: Secondary | ICD-10-CM | POA: Diagnosis not present

## 2015-03-17 DIAGNOSIS — N3090 Cystitis, unspecified without hematuria: Secondary | ICD-10-CM | POA: Diagnosis not present

## 2015-03-17 DIAGNOSIS — N3010 Interstitial cystitis (chronic) without hematuria: Secondary | ICD-10-CM | POA: Diagnosis not present

## 2015-03-23 DIAGNOSIS — N3090 Cystitis, unspecified without hematuria: Secondary | ICD-10-CM | POA: Diagnosis not present

## 2015-03-23 DIAGNOSIS — N3010 Interstitial cystitis (chronic) without hematuria: Secondary | ICD-10-CM | POA: Diagnosis not present

## 2015-03-25 DIAGNOSIS — L2389 Allergic contact dermatitis due to other agents: Secondary | ICD-10-CM | POA: Diagnosis not present

## 2015-03-25 DIAGNOSIS — L2084 Intrinsic (allergic) eczema: Secondary | ICD-10-CM | POA: Diagnosis not present

## 2015-03-25 DIAGNOSIS — L2081 Atopic neurodermatitis: Secondary | ICD-10-CM | POA: Diagnosis not present

## 2015-03-25 DIAGNOSIS — L298 Other pruritus: Secondary | ICD-10-CM | POA: Diagnosis not present

## 2015-03-25 DIAGNOSIS — L820 Inflamed seborrheic keratosis: Secondary | ICD-10-CM | POA: Diagnosis not present

## 2015-03-25 DIAGNOSIS — L233 Allergic contact dermatitis due to drugs in contact with skin: Secondary | ICD-10-CM | POA: Diagnosis not present

## 2015-04-17 DIAGNOSIS — R310 Gross hematuria: Secondary | ICD-10-CM | POA: Diagnosis not present

## 2015-04-29 ENCOUNTER — Non-Acute Institutional Stay: Payer: Medicare Other | Admitting: Internal Medicine

## 2015-04-29 ENCOUNTER — Encounter: Payer: Self-pay | Admitting: Internal Medicine

## 2015-04-29 VITALS — BP 100/62 | HR 84 | Temp 98.2°F | Ht 61.5 in | Wt 100.0 lb

## 2015-04-29 DIAGNOSIS — K5900 Constipation, unspecified: Secondary | ICD-10-CM

## 2015-04-29 DIAGNOSIS — N390 Urinary tract infection, site not specified: Secondary | ICD-10-CM

## 2015-04-29 DIAGNOSIS — K5909 Other constipation: Secondary | ICD-10-CM | POA: Insufficient documentation

## 2015-04-29 DIAGNOSIS — G250 Essential tremor: Secondary | ICD-10-CM | POA: Diagnosis not present

## 2015-04-29 DIAGNOSIS — A630 Anogenital (venereal) warts: Secondary | ICD-10-CM

## 2015-04-29 DIAGNOSIS — R2681 Unsteadiness on feet: Secondary | ICD-10-CM | POA: Diagnosis not present

## 2015-04-29 DIAGNOSIS — L239 Allergic contact dermatitis, unspecified cause: Secondary | ICD-10-CM | POA: Diagnosis not present

## 2015-04-29 DIAGNOSIS — M810 Age-related osteoporosis without current pathological fracture: Secondary | ICD-10-CM | POA: Diagnosis not present

## 2015-04-29 DIAGNOSIS — N3281 Overactive bladder: Secondary | ICD-10-CM | POA: Diagnosis not present

## 2015-04-29 DIAGNOSIS — N950 Postmenopausal bleeding: Secondary | ICD-10-CM | POA: Insufficient documentation

## 2015-04-29 DIAGNOSIS — G4700 Insomnia, unspecified: Secondary | ICD-10-CM

## 2015-04-29 DIAGNOSIS — Z66 Do not resuscitate: Secondary | ICD-10-CM | POA: Diagnosis not present

## 2015-04-29 NOTE — Progress Notes (Signed)
Patient ID: Melanie Freeman, female   DOB: [birth year], 81 y.o.   MRN: [id number]   Location:  [facility]  Code Status: DNR--discussed today and pt requests Do NOT resuscitate status  Goals of Care:Advanced Directive information Does patient have an advance directive?: Yes, Type of Advance Directive: Out of facility DNR (pink MOST or yellow form), Pre-existing out of facility DNR order (yellow form or pink MOST form): Yellow form placed in chart (order not valid for inpatient use), Does patient want to make changes to advanced directive?: No - Patient declined  Chief Complaint  Patient presents with  . Medical Management of Chronic Issues    New Patient to get est. Here with daughter Eustaquio Maize  . Establish Care    HPI: Patient is a 81 y.o. white female seen in the [facility] today to establish with me and to review her chronic medical conditions.    New to [facility] from [place], [place].    Her daughter is here with her and notes she has some memory loss.  Says she has a UTI and went to urologist before she left [place].  Gets them all of the time.  Is at the end of the prescription for amoxicillin 500mg  po tid and 250mg  at bedtime.  Marland Kitchen  Has not filled new Rx yet.   She has a new Rx for ampicillin 250mg  po tid for 7 days.  She also has a Rx for myrbetriq 50mg  daily which she says she's been taking it for a while.     As a child, she was thought to be allergic to penicillins.   Has taken many different Rxs as treatment for UTIs, but never preventive.  Needs urology referral.  Has suprapubic abdominal pain.  Is improved at the moment vs. Early this am.  No dysuria.  No chills.  No increased frequency.  Sometimes has urge, but cannot pass her urine.  No nocturia.  Also has allergic contact dermatitis from polyester.  Was given a name of a dermatologist here.  Has tubes of medication she uses for itching.    Only Rx other than the tubes is raloxifene 60mg  daily--takes for  "osteopenia" but paperwork indicates osteoporosis.  Denies other meds before.    Vaginal bleeding:  1.5 wks ago, 1 episode.  She was at [employer], went to the bathroom and had some bleeding.  Went to urgent care.   Had already been taking the amoxicillin.  Had already stopped.    Has not recurred.  Did not have a reaction when they gave her the briefs b/c she tucked her shirt in, she says.  It was bright red blood.  She does have hemorrhoids--says one is in the front and one in the rectal area.  They do not hurt.    Has dizziness and balance problems since feb of last year.  There's a slope outside of her door at her garden home.  Does not want to use an old-timey walker that she has.  Is using her mother's that has wheels despite clinic nurse having told her not to do this due to fall risk with it rolling away--pt says it has brakes and she knows how to use them.  Otherwise walks with cane.  Agrees to PT to evaluate her walking.    Appetite:  She is very thin.  Pt says she eats well.  Lost weight when she fractured her hip last year--was 10 lbs more, but apparently dropped  from size 8 to size 4 petite.  Sometimes calls and gets food from dining room.  Says too far to walk so drives her car over there.  May not have been eating well until her car got fixed.  Her daughter says she does not eat three meals per day.  Eats half of lunch at lunch and half at dinner.  Loves sweets.  Counseled on increasing protein in her diet.    Insomnia:  Sleep is disrupted. Wakens really early and cannot go back to sleep, then sleeps all afternoon.  At one time, she took melatonin. Didn't feel like the melatonin helped her.  Has not restarted it since trying to go to bed at the same time, get up at the same time, etc.  Her son in law did flip the mattress which helped a little.  Discussed that exercise would help.    Tremor:  Went to neurologist recently.  Involves hands and head.  Dr. Gloriann Loan had seen her.    Had  prevnar in Jan 09/27/14.  Says the shot was given into her bone on her left shoulder and she has trouble moving her shoulder since.    Had hepatitis 50 years ago when she ate something.  Was too sick to care for her baby.    Has osteoarthritis:  Born and raised in [place]fell on ice there.  Had problems with left knee after moved to [facility].  Got shots in her left knee.  Didn't bother her again until after she broke her left hip.  Uses tylenol arthritis for that.  Both knees hurt sometimes.  Years ago had xrays, but none lately.  May have cataracts.  Wears glasses.  Says she was tested for macular degeneration and it was negative. Has the grid to check her eyes.  Says she had them tested just before coming and has had same Rx for 3 years.    Hears well.  Still drives.  Has to pass driver's test here in [place].     Review of Systems:  Review of Systems  Constitutional: Positive for weight loss and malaise/fatigue. Negative for fever and chills.  HENT: Negative for congestion and hearing loss.   Eyes: Negative for blurred vision.       Glasses  Respiratory: Negative for cough and shortness of breath.   Cardiovascular: Negative for chest pain, palpitations and leg swelling.  Gastrointestinal: Negative for heartburn, abdominal pain, constipation, blood in stool and melena.  Genitourinary: Positive for urgency and frequency. Negative for dysuria and hematuria.  Musculoskeletal: Positive for joint pain and falls.       Left hip, bilateral knees  Skin: Positive for itching and rash.       Allergic contact dermatitis--says to polyester  Neurological: Positive for weakness and headaches. Negative for dizziness, sensory change, focal weakness and loss of consciousness.  Endo/Heme/Allergies: Does not bruise/bleed easily.  Psychiatric/Behavioral: Positive for memory loss. Negative for depression. The patient has insomnia.     Past Medical History  Diagnosis Date  . Cystitis, interstitial   . Tremors of  nervous system   . Allergic contact dermatitis   . Vertigo   . Arthritis   . Osteopenia   . Hepatitis     over 50 years ago    Past Surgical History  Procedure Laterality Date  . Tonsillectomy    . Appendectomy    . Hip orif w/ capsulotomy Left 10/22/2013    [name]     Social History:  reports that she has never smoked. She has never used smokeless tobacco. She reports that she does not drink alcohol or use illicit drugs.  Allergies  Allergen Reactions  . Ciprofloxacin   . Doxycycline   . Levaquin [Levofloxacin In D5w]   . Lopid [Gemfibrozil]   . Macrobid [employer] Macro]   . Sulfa Antibiotics     Medications: Patient's Medications  New Prescriptions   No medications on file  Previous Medications   AMOXICILLIN (AMOXIL) 500 MG CAPSULE    Take 500 mg by mouth 3 (three) times daily. For urinary infection   CALCIUM CARB-CHOLECALCIFEROL (CALCIUM 600+D3) 600-800 MG-UNIT TABS    Take by mouth. Take one tablet daily   CHOLECALCIFEROL (D3 HIGH POTENCY) 1000 UNITS CAPSULE    Take 1,000 Units by mouth. Take one tablet daily   FLUOCINOLONE (VANOS) 0.01 % CREAM    Apply topically. Apply on skin 1-2 times a day as needed for skin allergy   MAGNESIUM 400 MG CAPS    Take by mouth. Take one tablet daily   MIRABEGRON ER (MYRBETRIQ) 50 MG TB24 TABLET    Take 50 mg by mouth daily. Take one tablet daily for bladder   OMEGA-3 FATTY ACIDS (FISH OIL) 1200 MG CAPS    Take by mouth. Take one tablet twice daily   PIMECROLIMUS (ELIDEL) 1 % CREAM    Apply topically. Apply on skin 1-2 times a day as needed   POLYETHYLENE GLYCOL (MIRALAX / GLYCOLAX) PACKET    Take 17 g by mouth. Use as needed   RALOXIFENE (EVISTA) 60 MG TABLET    Take 60 mg by mouth. Take one table daily   TACROLIMUS (PROTOPIC) 0.1 % OINTMENT    Apply topically. Apply to skin as needed   VITAMIN B-12 (CYANOCOBALAMIN) 1000 MCG TABLET    Take 1,000 mcg by mouth. Take one tablet daily  Modified Medications   No  medications on file  Discontinued Medications   No medications on file     Physical Exam: Filed Vitals:   04/29/15 1109  BP: 100/62  Pulse: 84  Temp: 98.2 F (36.8 C)  TempSrc: Oral  Height: 5' 1.5" (1.562 m)  Weight: 100 lb (45.36 kg)  SpO2: 99%   Body mass index is 18.59 kg/(m^2). Physical Exam  Constitutional: No distress.  Frail white female  HENT:  Head: Normocephalic and atraumatic.  Eyes:  glasses  Cardiovascular: Normal rate, regular rhythm, normal heart sounds and intact distal pulses.   Pulmonary/Chest: Effort normal and breath sounds normal. No respiratory distress.  Abdominal: Soft. Bowel sounds are normal. She exhibits no distension. There is no tenderness.  Musculoskeletal: Normal range of motion.  Very unsteady, drunk-like gait  Neurological: She is alert. She exhibits abnormal muscle tone.  Tremor of bilateral hands; she is pleasant but has poor short term memory--gets mixed up frequently  Skin: Skin is warm and dry. Rash noted.  Psychiatric: She has a normal mood and affect.     Labs reviewed: Basic Metabolic Panel: No results for input(s): NA, K, CL, CO2, GLUCOSE, BUN, CREATININE, CALCIUM, MG, PHOS, TSH in the last 8760 hours. Liver Function Tests: No results for input(s): AST, ALT, ALKPHOS, BILITOT, PROT, ALBUMIN in the last 8760 hours. No results for input(s): LIPASE, AMYLASE in the last 8760 hours. No results for input(s): AMMONIA in the last 8760 hours. CBC: No results for input(s): WBC, NEUTROABS, HGB, HCT, MCV, PLT in the last 8760 hours. Lipid Panel: No results for input(s): CHOL, HDL,  LDLCALC, TRIG, CHOLHDL, LDLDIRECT in the last 8760 hours. No results found for: HGBA1C  Patient Care Team: Gayland Curry, DO as PCP - General (Geriatric Medicine)  Assessment/Plan 1. Postmenopausal vaginal bleeding - was seen by clinic nurse who noted it was spotting, not outright bleeding -unclear if it came from the wart lesions seen today or  internally or from hemorrhoids, will refer for further eval due to this and #2--pt thinks she has hemorrhoids up front - Ambulatory referral to Gynecology  2. Genital warts -appears this is what she has anteriorly on labia and some of the perirectal lesions also look more like warts than hemorrhoids to me--could not get a reasonable history from the patient about duration they've been present - Ambulatory referral to Gynecology  3. Recurrent UTIs -per patient, need better records, but her last urologist put her on amoxicillin that she's been taking and gave her more to take here, had not been on prophylaxis until this last month -current symptoms do not sound like infection to me with only mild suprapubic pain and no dysuria, frequency, urgency, fever, back pain, etc. - Ambulatory referral to Urology  4. Overactive bladder - seems she does not fully empty her bladder at times -her urologist from [facility] just started her on myrbetriq from what I can tell, but she says "i've been on it for years"--unclear, await records - Ambulatory referral to Urology  5. Unsteady gait -has "drunken" type of ataxic gait -unsteady, uses can and wall/grab bars or other people to walk and unsafely using a rolling walker in her sloped driveway -awaits an independent living apt in place of garden home  6. Allergic contact dermatitis due to multiple agents -continues fluocinolone and elidel cream -says she's not using the tacrolimus ointment  7. Benign essential tremor -cont to monitor--says she's allergic to all of the possible meds for treatment--unclear if this is true  8. Senile osteoporosis -cont vitamin D 1000 units daily and ca with D daily as well as raloxifene for the time being -need records to learn when last bone density was and if she ever took other meds for this  9. Chronic constipation -uses miralax as needed, but denied constipation today  10. Insomnia -discussed use of melatonin which she  says didn't work, but it sounds like use has been inconsistent  More than 75 minutes were spent reviewing her history with her and her daughter, reviewing records, determining code status and advance directives, pill bottles and also examining her including brief pelvic exam.  Records were also requested from her PCP in [place].  Labs/tests ordered:  PT, urology, gynecology referrals Next appt:  1 month to f/u on today's concerns  Yanin Muhlestein L. Zeriah Baysinger, D.O. [facility] [address], [place] [address] Cell Phone (Mon-Fri 8am-5pm):  [phone] On Call:  [phone] & follow prompts after 5pm & weekends Office Phone:  [phone] Office Fax:  [phone]

## 2015-05-11 DIAGNOSIS — M6281 Muscle weakness (generalized): Secondary | ICD-10-CM | POA: Diagnosis not present

## 2015-05-11 DIAGNOSIS — M25512 Pain in left shoulder: Secondary | ICD-10-CM | POA: Diagnosis not present

## 2015-05-11 DIAGNOSIS — G3184 Mild cognitive impairment, so stated: Secondary | ICD-10-CM | POA: Diagnosis not present

## 2015-05-11 DIAGNOSIS — R4189 Other symptoms and signs involving cognitive functions and awareness: Secondary | ICD-10-CM | POA: Diagnosis not present

## 2015-05-11 DIAGNOSIS — Z9181 History of falling: Secondary | ICD-10-CM | POA: Diagnosis not present

## 2015-05-11 DIAGNOSIS — M170 Bilateral primary osteoarthritis of knee: Secondary | ICD-10-CM | POA: Diagnosis not present

## 2015-05-11 DIAGNOSIS — R2681 Unsteadiness on feet: Secondary | ICD-10-CM | POA: Diagnosis not present

## 2015-05-11 DIAGNOSIS — R278 Other lack of coordination: Secondary | ICD-10-CM | POA: Diagnosis not present

## 2015-05-11 DIAGNOSIS — R296 Repeated falls: Secondary | ICD-10-CM | POA: Diagnosis not present

## 2015-05-12 DIAGNOSIS — M170 Bilateral primary osteoarthritis of knee: Secondary | ICD-10-CM | POA: Diagnosis not present

## 2015-05-12 DIAGNOSIS — G3184 Mild cognitive impairment, so stated: Secondary | ICD-10-CM | POA: Diagnosis not present

## 2015-05-12 DIAGNOSIS — R4189 Other symptoms and signs involving cognitive functions and awareness: Secondary | ICD-10-CM | POA: Diagnosis not present

## 2015-05-12 DIAGNOSIS — M25512 Pain in left shoulder: Secondary | ICD-10-CM | POA: Diagnosis not present

## 2015-05-12 DIAGNOSIS — R2681 Unsteadiness on feet: Secondary | ICD-10-CM | POA: Diagnosis not present

## 2015-05-12 DIAGNOSIS — M6281 Muscle weakness (generalized): Secondary | ICD-10-CM | POA: Diagnosis not present

## 2015-05-14 DIAGNOSIS — G3184 Mild cognitive impairment, so stated: Secondary | ICD-10-CM | POA: Diagnosis not present

## 2015-05-14 DIAGNOSIS — M170 Bilateral primary osteoarthritis of knee: Secondary | ICD-10-CM | POA: Diagnosis not present

## 2015-05-14 DIAGNOSIS — R2681 Unsteadiness on feet: Secondary | ICD-10-CM | POA: Diagnosis not present

## 2015-05-14 DIAGNOSIS — R4189 Other symptoms and signs involving cognitive functions and awareness: Secondary | ICD-10-CM | POA: Diagnosis not present

## 2015-05-14 DIAGNOSIS — M6281 Muscle weakness (generalized): Secondary | ICD-10-CM | POA: Diagnosis not present

## 2015-05-14 DIAGNOSIS — M25512 Pain in left shoulder: Secondary | ICD-10-CM | POA: Diagnosis not present

## 2015-05-15 DIAGNOSIS — R2681 Unsteadiness on feet: Secondary | ICD-10-CM | POA: Diagnosis not present

## 2015-05-15 DIAGNOSIS — M6281 Muscle weakness (generalized): Secondary | ICD-10-CM | POA: Diagnosis not present

## 2015-05-15 DIAGNOSIS — M25512 Pain in left shoulder: Secondary | ICD-10-CM | POA: Diagnosis not present

## 2015-05-15 DIAGNOSIS — R4189 Other symptoms and signs involving cognitive functions and awareness: Secondary | ICD-10-CM | POA: Diagnosis not present

## 2015-05-15 DIAGNOSIS — G3184 Mild cognitive impairment, so stated: Secondary | ICD-10-CM | POA: Diagnosis not present

## 2015-05-15 DIAGNOSIS — M170 Bilateral primary osteoarthritis of knee: Secondary | ICD-10-CM | POA: Diagnosis not present

## 2015-05-18 DIAGNOSIS — M170 Bilateral primary osteoarthritis of knee: Secondary | ICD-10-CM | POA: Diagnosis not present

## 2015-05-18 DIAGNOSIS — G3184 Mild cognitive impairment, so stated: Secondary | ICD-10-CM | POA: Diagnosis not present

## 2015-05-18 DIAGNOSIS — R4189 Other symptoms and signs involving cognitive functions and awareness: Secondary | ICD-10-CM | POA: Diagnosis not present

## 2015-05-18 DIAGNOSIS — M25512 Pain in left shoulder: Secondary | ICD-10-CM | POA: Diagnosis not present

## 2015-05-18 DIAGNOSIS — M6281 Muscle weakness (generalized): Secondary | ICD-10-CM | POA: Diagnosis not present

## 2015-05-18 DIAGNOSIS — R2681 Unsteadiness on feet: Secondary | ICD-10-CM | POA: Diagnosis not present

## 2015-05-19 DIAGNOSIS — M25512 Pain in left shoulder: Secondary | ICD-10-CM | POA: Diagnosis not present

## 2015-05-19 DIAGNOSIS — M6281 Muscle weakness (generalized): Secondary | ICD-10-CM | POA: Diagnosis not present

## 2015-05-19 DIAGNOSIS — M170 Bilateral primary osteoarthritis of knee: Secondary | ICD-10-CM | POA: Diagnosis not present

## 2015-05-19 DIAGNOSIS — G3184 Mild cognitive impairment, so stated: Secondary | ICD-10-CM | POA: Diagnosis not present

## 2015-05-19 DIAGNOSIS — R2681 Unsteadiness on feet: Secondary | ICD-10-CM | POA: Diagnosis not present

## 2015-05-19 DIAGNOSIS — R4189 Other symptoms and signs involving cognitive functions and awareness: Secondary | ICD-10-CM | POA: Diagnosis not present

## 2015-05-21 DIAGNOSIS — N950 Postmenopausal bleeding: Secondary | ICD-10-CM | POA: Diagnosis not present

## 2015-05-22 DIAGNOSIS — M170 Bilateral primary osteoarthritis of knee: Secondary | ICD-10-CM | POA: Diagnosis not present

## 2015-05-22 DIAGNOSIS — R4189 Other symptoms and signs involving cognitive functions and awareness: Secondary | ICD-10-CM | POA: Diagnosis not present

## 2015-05-22 DIAGNOSIS — R2681 Unsteadiness on feet: Secondary | ICD-10-CM | POA: Diagnosis not present

## 2015-05-22 DIAGNOSIS — G3184 Mild cognitive impairment, so stated: Secondary | ICD-10-CM | POA: Diagnosis not present

## 2015-05-22 DIAGNOSIS — M25512 Pain in left shoulder: Secondary | ICD-10-CM | POA: Diagnosis not present

## 2015-05-22 DIAGNOSIS — M6281 Muscle weakness (generalized): Secondary | ICD-10-CM | POA: Diagnosis not present

## 2015-05-25 DIAGNOSIS — M170 Bilateral primary osteoarthritis of knee: Secondary | ICD-10-CM | POA: Diagnosis not present

## 2015-05-25 DIAGNOSIS — M6281 Muscle weakness (generalized): Secondary | ICD-10-CM | POA: Diagnosis not present

## 2015-05-25 DIAGNOSIS — R2681 Unsteadiness on feet: Secondary | ICD-10-CM | POA: Diagnosis not present

## 2015-05-25 DIAGNOSIS — G3184 Mild cognitive impairment, so stated: Secondary | ICD-10-CM | POA: Diagnosis not present

## 2015-05-25 DIAGNOSIS — M25512 Pain in left shoulder: Secondary | ICD-10-CM | POA: Diagnosis not present

## 2015-05-25 DIAGNOSIS — R4189 Other symptoms and signs involving cognitive functions and awareness: Secondary | ICD-10-CM | POA: Diagnosis not present

## 2015-05-26 DIAGNOSIS — R2681 Unsteadiness on feet: Secondary | ICD-10-CM | POA: Diagnosis not present

## 2015-05-26 DIAGNOSIS — R4189 Other symptoms and signs involving cognitive functions and awareness: Secondary | ICD-10-CM | POA: Diagnosis not present

## 2015-05-26 DIAGNOSIS — G3184 Mild cognitive impairment, so stated: Secondary | ICD-10-CM | POA: Diagnosis not present

## 2015-05-26 DIAGNOSIS — M25512 Pain in left shoulder: Secondary | ICD-10-CM | POA: Diagnosis not present

## 2015-05-26 DIAGNOSIS — M170 Bilateral primary osteoarthritis of knee: Secondary | ICD-10-CM | POA: Diagnosis not present

## 2015-05-26 DIAGNOSIS — M6281 Muscle weakness (generalized): Secondary | ICD-10-CM | POA: Diagnosis not present

## 2015-05-28 DIAGNOSIS — G3184 Mild cognitive impairment, so stated: Secondary | ICD-10-CM | POA: Diagnosis not present

## 2015-05-28 DIAGNOSIS — R4189 Other symptoms and signs involving cognitive functions and awareness: Secondary | ICD-10-CM | POA: Diagnosis not present

## 2015-05-28 DIAGNOSIS — M6281 Muscle weakness (generalized): Secondary | ICD-10-CM | POA: Diagnosis not present

## 2015-05-28 DIAGNOSIS — R2681 Unsteadiness on feet: Secondary | ICD-10-CM | POA: Diagnosis not present

## 2015-05-28 DIAGNOSIS — M170 Bilateral primary osteoarthritis of knee: Secondary | ICD-10-CM | POA: Diagnosis not present

## 2015-05-28 DIAGNOSIS — M25512 Pain in left shoulder: Secondary | ICD-10-CM | POA: Diagnosis not present

## 2015-05-29 DIAGNOSIS — M6281 Muscle weakness (generalized): Secondary | ICD-10-CM | POA: Diagnosis not present

## 2015-05-29 DIAGNOSIS — M25512 Pain in left shoulder: Secondary | ICD-10-CM | POA: Diagnosis not present

## 2015-05-29 DIAGNOSIS — G3184 Mild cognitive impairment, so stated: Secondary | ICD-10-CM | POA: Diagnosis not present

## 2015-05-29 DIAGNOSIS — R4189 Other symptoms and signs involving cognitive functions and awareness: Secondary | ICD-10-CM | POA: Diagnosis not present

## 2015-05-29 DIAGNOSIS — R2681 Unsteadiness on feet: Secondary | ICD-10-CM | POA: Diagnosis not present

## 2015-05-29 DIAGNOSIS — M170 Bilateral primary osteoarthritis of knee: Secondary | ICD-10-CM | POA: Diagnosis not present

## 2015-06-01 DIAGNOSIS — G3184 Mild cognitive impairment, so stated: Secondary | ICD-10-CM | POA: Diagnosis not present

## 2015-06-01 DIAGNOSIS — R4189 Other symptoms and signs involving cognitive functions and awareness: Secondary | ICD-10-CM | POA: Diagnosis not present

## 2015-06-01 DIAGNOSIS — R278 Other lack of coordination: Secondary | ICD-10-CM | POA: Diagnosis not present

## 2015-06-01 DIAGNOSIS — M6281 Muscle weakness (generalized): Secondary | ICD-10-CM | POA: Diagnosis not present

## 2015-06-01 DIAGNOSIS — Z9181 History of falling: Secondary | ICD-10-CM | POA: Diagnosis not present

## 2015-06-01 DIAGNOSIS — R2681 Unsteadiness on feet: Secondary | ICD-10-CM | POA: Diagnosis not present

## 2015-06-01 DIAGNOSIS — R296 Repeated falls: Secondary | ICD-10-CM | POA: Diagnosis not present

## 2015-06-01 DIAGNOSIS — M170 Bilateral primary osteoarthritis of knee: Secondary | ICD-10-CM | POA: Diagnosis not present

## 2015-06-01 DIAGNOSIS — M25512 Pain in left shoulder: Secondary | ICD-10-CM | POA: Diagnosis not present

## 2015-06-02 DIAGNOSIS — N840 Polyp of corpus uteri: Secondary | ICD-10-CM | POA: Diagnosis not present

## 2015-06-02 DIAGNOSIS — N950 Postmenopausal bleeding: Secondary | ICD-10-CM | POA: Diagnosis not present

## 2015-06-03 ENCOUNTER — Non-Acute Institutional Stay: Payer: Medicare Other | Admitting: Internal Medicine

## 2015-06-03 ENCOUNTER — Encounter: Payer: Self-pay | Admitting: Internal Medicine

## 2015-06-03 VITALS — BP 126/72 | HR 88 | Temp 98.1°F | Wt 109.0 lb

## 2015-06-03 DIAGNOSIS — N3281 Overactive bladder: Secondary | ICD-10-CM | POA: Diagnosis not present

## 2015-06-03 DIAGNOSIS — N950 Postmenopausal bleeding: Secondary | ICD-10-CM

## 2015-06-03 DIAGNOSIS — G6289 Other specified polyneuropathies: Secondary | ICD-10-CM | POA: Diagnosis not present

## 2015-06-03 DIAGNOSIS — L723 Sebaceous cyst: Secondary | ICD-10-CM | POA: Diagnosis not present

## 2015-06-03 DIAGNOSIS — M25512 Pain in left shoulder: Secondary | ICD-10-CM | POA: Diagnosis not present

## 2015-06-03 DIAGNOSIS — R2681 Unsteadiness on feet: Secondary | ICD-10-CM | POA: Diagnosis not present

## 2015-06-03 DIAGNOSIS — M6281 Muscle weakness (generalized): Secondary | ICD-10-CM | POA: Diagnosis not present

## 2015-06-03 DIAGNOSIS — G3184 Mild cognitive impairment, so stated: Secondary | ICD-10-CM | POA: Diagnosis not present

## 2015-06-03 DIAGNOSIS — N907 Vulvar cyst: Secondary | ICD-10-CM

## 2015-06-03 DIAGNOSIS — M170 Bilateral primary osteoarthritis of knee: Secondary | ICD-10-CM | POA: Diagnosis not present

## 2015-06-03 DIAGNOSIS — R4189 Other symptoms and signs involving cognitive functions and awareness: Secondary | ICD-10-CM | POA: Diagnosis not present

## 2015-06-03 NOTE — Progress Notes (Signed)
Location: [facility]  Code Status: DNR Goals of Care: Advanced Directive information     Chief Complaint  Patient presents with  . Medical Management of Chronic Issues    here with daughter Eustaquio Maize    HPI: Patient is a 81 y.o. white female seen in the office today for follow up of chronic medical issues.   She has been to OB/GYN and they found a polyp in her uterus via sonohistogram. She is going to have a hysteroscope to remove the polyp and resolve the bleeding. Cysts were also found around her labia and rectum.   She continues to have some malaise and fatigue. She isn't sleeping well, but she did have a good night of sleep early. She is waking frequently. She is up frequently to go to the bathroom- last night 6-7 times. Denies urgency, dysuria, incontinence. Daughter notes that when they are out and about she is unable to void urine, but when at home voids ok. Patient feels that its hard to go if someone is waiting for her. She has an appointment with Dr. Matilde Sprang on October 13th.   She has gained nine pounds since last visit. This is good news. She is still eating half meals, but adding some protein, she had cheese with her lunch today.   She has arthritis in her knees. Takes Tylenol arthritis with some relief. She has been seen by orthopedic doctor on [place]. She is using a cane, and a walker at home. She feels like she needs to see an orthopedist again.  Notes neuropathy in her feet, onset just before moving. She has not tried any medication. Does interfere with gait at times. She has also been seeing PT and they noted she is a low/moderate fall risk and have evaluated her for a new walker that she can lift to get into the car. She does not know what has caused her neuropathy. She has had it ever since she had her hip surgery in 2015.   She was seen by occupational therapy for left shoulder pain. Notes this is helping with pain and helping getting her clothes on.   She  went to [facility] and was able to get a restricted license and isn't able to drive on the highway. She only drives to [address] and to the main [facility] building.  Review of Systems:  Review of Systems  Constitutional: Negative for fever and weight loss.  Respiratory: Negative for cough and hemoptysis.   Cardiovascular: Negative for chest pain and palpitations.  Gastrointestinal: Negative for nausea, vomiting, abdominal pain and diarrhea.  Genitourinary: Positive for frequency. Negative for dysuria.  Musculoskeletal: Positive for joint pain. Negative for falls.  Neurological: Positive for dizziness, tingling and tremors.  Endo/Heme/Allergies: Bruises/bleeds easily.  Psychiatric/Behavioral: Negative for depression. The patient has insomnia.     Past Medical History  Diagnosis Date  . Cystitis, interstitial   . Tremors of nervous system   . Allergic contact dermatitis   . Vertigo   . Arthritis   . Osteopenia   . Hepatitis     over 50 years ago    Past Surgical History  Procedure Laterality Date  . Tonsillectomy    . Appendectomy    . Hip orif w/ capsulotomy Left 10/22/2013    [name]     Allergies  Allergen Reactions  . Ciprofloxacin   . Doxycycline   . Levaquin [Levofloxacin In D5w]   . Lopid [Gemfibrozil]   . Macrobid [employer]  Macro]   . Sulfa Antibiotics    Medications: Patient's Medications  New Prescriptions   No medications on file  Previous Medications   CALCIUM CARB-CHOLECALCIFEROL (CALCIUM 600+D3) 600-800 MG-UNIT TABS    Take by mouth. Take one tablet daily   CHOLECALCIFEROL (D3 HIGH POTENCY) 1000 UNITS CAPSULE    Take 1,000 Units by mouth. Take one tablet daily   FLUOCINOLONE (VANOS) 0.01 % CREAM    Apply topically. Apply on skin 1-2 times a day as needed for skin allergy   MAGNESIUM 400 MG CAPS    Take by mouth. Take one tablet daily   MIRABEGRON ER (MYRBETRIQ) 50 MG TB24 TABLET    Take 50 mg by mouth daily. Take one tablet daily  for bladder   OMEGA-3 FATTY ACIDS (FISH OIL) 1200 MG CAPS    Take by mouth. Take one tablet twice daily   PIMECROLIMUS (ELIDEL) 1 % CREAM    Apply topically. Apply on skin 1-2 times a day as needed   POLYETHYLENE GLYCOL (MIRALAX / GLYCOLAX) PACKET    Take 17 g by mouth. Use as needed   RALOXIFENE (EVISTA) 60 MG TABLET    Take 60 mg by mouth. Take one table daily   TACROLIMUS (PROTOPIC) 0.1 % OINTMENT    Apply topically. Apply to skin as needed   VITAMIN B-12 (CYANOCOBALAMIN) 1000 MCG TABLET    Take 1,000 mcg by mouth. Take one tablet daily  Modified Medications   No medications on file  Discontinued Medications   No medications on file    Physical Exam: Filed Vitals:   06/03/15 1548  BP: 126/72  Pulse: 88  Temp: 98.1 F (36.7 C)  TempSrc: Oral  Weight: 109 lb (49.442 kg)  SpO2: 99%   Physical Exam  Constitutional: No distress.  Frail white female  HENT:  Head: Normocephalic and atraumatic.  Eyes:  glasses  Cardiovascular: Normal rate, regular rhythm, normal heart sounds and intact distal pulses.   Pulmonary/Chest: Effort normal and breath sounds normal. No respiratory distress.  Abdominal: Soft. Bowel sounds are normal. She exhibits no distension. There is no tenderness.  Musculoskeletal:  Unsteady, wobbling gait, decreased active and passive ROM in left shoulder Decreased grip strength in left hand  Neurological: She is alert.  Tremor of bilateral hands and head Bilateral feet with pain sensation intact  Skin: Skin is warm and dry.  Psychiatric: She has a normal mood and affect.      Assessment/Plan 1. Sebaceous cyst of labia Recently diagnosed by OB/GYN. No treatment indicated.   2. Postmenopausal vaginal bleeding Found to be caused by a uterine polyp by OB/GYN. She is going to be scheduled for a hysteroscopy.  3. Unsteady gait Appears drunk-like. Likely related to nerve damage from hip surgery and leg-length discrepancy. Continue OT/PT. PT is working on  getting patient an appropriate walker.  4. Overactive bladder Myrbetriq doesn't seem to be helping, and dosage is at max. Patient has appointment with urology in October and appreciate their help with this problem.  5. Other polyneuropathy ([place]) Likely related to nerve damage from hip surgery. She is not a candidate for management with Lyrica at this point due to not wanting to increase her gait instability. Will continue to monitor and may consider a low dose Lyrica in the future is PT is able to increase her stability.    Labs/tests ordered: Next appt: 2 months with Dr. Mariea Clonts for follow up of medical management of chronic issues.  Mariana Kaufman, RN, BSN Student-  Nurse Practitioner- [facility] [address], [place] [address] Office Phone:  [phone] Office Fax:  [phone]

## 2015-06-05 DIAGNOSIS — M170 Bilateral primary osteoarthritis of knee: Secondary | ICD-10-CM | POA: Diagnosis not present

## 2015-06-05 DIAGNOSIS — M6281 Muscle weakness (generalized): Secondary | ICD-10-CM | POA: Diagnosis not present

## 2015-06-05 DIAGNOSIS — R4189 Other symptoms and signs involving cognitive functions and awareness: Secondary | ICD-10-CM | POA: Diagnosis not present

## 2015-06-05 DIAGNOSIS — M25512 Pain in left shoulder: Secondary | ICD-10-CM | POA: Diagnosis not present

## 2015-06-05 DIAGNOSIS — G3184 Mild cognitive impairment, so stated: Secondary | ICD-10-CM | POA: Diagnosis not present

## 2015-06-05 DIAGNOSIS — R2681 Unsteadiness on feet: Secondary | ICD-10-CM | POA: Diagnosis not present

## 2015-06-10 DIAGNOSIS — R4189 Other symptoms and signs involving cognitive functions and awareness: Secondary | ICD-10-CM | POA: Diagnosis not present

## 2015-06-10 DIAGNOSIS — R2681 Unsteadiness on feet: Secondary | ICD-10-CM | POA: Diagnosis not present

## 2015-06-10 DIAGNOSIS — M6281 Muscle weakness (generalized): Secondary | ICD-10-CM | POA: Diagnosis not present

## 2015-06-10 DIAGNOSIS — M25512 Pain in left shoulder: Secondary | ICD-10-CM | POA: Diagnosis not present

## 2015-06-10 DIAGNOSIS — G3184 Mild cognitive impairment, so stated: Secondary | ICD-10-CM | POA: Diagnosis not present

## 2015-06-10 DIAGNOSIS — M170 Bilateral primary osteoarthritis of knee: Secondary | ICD-10-CM | POA: Diagnosis not present

## 2015-06-11 DIAGNOSIS — N3020 Other chronic cystitis without hematuria: Secondary | ICD-10-CM | POA: Diagnosis not present

## 2015-06-11 DIAGNOSIS — N390 Urinary tract infection, site not specified: Secondary | ICD-10-CM | POA: Diagnosis not present

## 2015-06-11 DIAGNOSIS — R350 Frequency of micturition: Secondary | ICD-10-CM | POA: Diagnosis not present

## 2015-06-11 DIAGNOSIS — R351 Nocturia: Secondary | ICD-10-CM | POA: Diagnosis not present

## 2015-06-12 DIAGNOSIS — M6281 Muscle weakness (generalized): Secondary | ICD-10-CM | POA: Diagnosis not present

## 2015-06-12 DIAGNOSIS — G3184 Mild cognitive impairment, so stated: Secondary | ICD-10-CM | POA: Diagnosis not present

## 2015-06-12 DIAGNOSIS — M25512 Pain in left shoulder: Secondary | ICD-10-CM | POA: Diagnosis not present

## 2015-06-12 DIAGNOSIS — M170 Bilateral primary osteoarthritis of knee: Secondary | ICD-10-CM | POA: Diagnosis not present

## 2015-06-12 DIAGNOSIS — R2681 Unsteadiness on feet: Secondary | ICD-10-CM | POA: Diagnosis not present

## 2015-06-12 DIAGNOSIS — R4189 Other symptoms and signs involving cognitive functions and awareness: Secondary | ICD-10-CM | POA: Diagnosis not present

## 2015-06-15 DIAGNOSIS — R4189 Other symptoms and signs involving cognitive functions and awareness: Secondary | ICD-10-CM | POA: Diagnosis not present

## 2015-06-15 DIAGNOSIS — N3020 Other chronic cystitis without hematuria: Secondary | ICD-10-CM | POA: Diagnosis not present

## 2015-06-15 DIAGNOSIS — M6281 Muscle weakness (generalized): Secondary | ICD-10-CM | POA: Diagnosis not present

## 2015-06-15 DIAGNOSIS — M170 Bilateral primary osteoarthritis of knee: Secondary | ICD-10-CM | POA: Diagnosis not present

## 2015-06-15 DIAGNOSIS — G3184 Mild cognitive impairment, so stated: Secondary | ICD-10-CM | POA: Diagnosis not present

## 2015-06-15 DIAGNOSIS — M25512 Pain in left shoulder: Secondary | ICD-10-CM | POA: Diagnosis not present

## 2015-06-15 DIAGNOSIS — R2681 Unsteadiness on feet: Secondary | ICD-10-CM | POA: Diagnosis not present

## 2015-06-15 NOTE — H&P (Signed)
NAME:  Melanie Freeman, JOFFE NO.:  [id number]  MEDICAL RECORD NO.:  [id number]  LOCATION:                                 FACILITY:  PHYSICIAN:  [name], M.D.        DATE OF BIRTH:  DATE OF ADMISSION: DATE OF DISCHARGE:                             HISTORY & PHYSICAL   DATE OF SURGERY:  June 26, 2015.  HISTORY OF PRESENT ILLNESS:  The patient is an 81 year old postmenopausal patient, who was evaluated here in the office for postmenopausal bleeding.  On saline infusion ultrasound, she did have an 8.6 mm endometrial polyp.  The endometrial thickness was not overly thickened, but because of the polyp, she now comes in for hysteroscopy with MyoSure and dilation and curettage.  ALLERGIES:  SHE IS ALLERGIC TO CIPROFLOXACIN, DOXYCYCLINE, LEVAQUIN, LOPID, MACROBID, AND SULFA ANTIBIOTICS.  MEDICATIONS:  She is on calcium replacement, vitamin D, fluocinolone 0.01% cream, magnesium supplementation, Elidel 1% cream, and MiraLAX.  PAST MEDICAL HISTORY:  She has a history of interstitial cystitis.  She has vertigo, arthritis, osteopenia, and past history of hepatitis.  PAST SURGICAL HISTORY:  She has had her tonsils removed, appendectomy, and hip surgery.  SOCIAL HISTORY:  No tobacco or alcohol use.  FAMILY HISTORY:  Noncontributory.  REVIEW OF SYSTEMS:  Noncontributory.  PHYSICAL EXAMINATION:  VITAL SIGNS:  The patient is afebrile, stable vital signs. HEENT:  The patient is normocephalic.  Pupils are equal, round, and reactive to light and accommodation.  Extraocular movements were intact. Sclerae and conjunctivae were clear.  Oropharynx clear. LUNGS:  Clear. CARDIOVASCULAR:  Regular rhythm and rate without murmurs or gallops. ABDOMEN:  Benign.  No mass, organomegaly, or tenderness. PELVIC:  Normal external genitalia.  Vaginal mucosa atrophic.  Cervix unremarkable.  Uterus appeared to be of normal size and shape.  Adnexae unremarkable. EXTREMITIES:  Trace  edema. NEUROLOGIC:  Grossly within normal limits.  IMPRESSION:  Postmenopausal bleeding with evidence of endometrial polyp.  PLAN:  The patient will to undergo hysteroscopy with MyoSure resection of the polyp.  She will also have dilation and curettage.  The risks of surgery have been explained including the risk of infection.  The risk of hemorrhage that could require transfusion with the risk of AIDS or hepatitis.  Excessive bleeding could require hysterectomy.  There is a risk of perforation with injury to adjacent organs that could require further exploratory surgery.  Risk of deep venous thrombosis and pulmonary embolus.  The patient expressed understanding of the potential risks and complications.     [name], M.D.     JSM/MEDQ  D:  06/15/2015  T:  06/15/2015  Job:  [id number]

## 2015-06-15 NOTE — H&P (Signed)
  Patient name  VESTA, WHEELAND DICTATION# [id number] CSN# [id number]  [name], MD 06/15/2015 10:03 AM

## 2015-06-17 DIAGNOSIS — M25512 Pain in left shoulder: Secondary | ICD-10-CM | POA: Diagnosis not present

## 2015-06-17 DIAGNOSIS — M6281 Muscle weakness (generalized): Secondary | ICD-10-CM | POA: Diagnosis not present

## 2015-06-17 DIAGNOSIS — R2681 Unsteadiness on feet: Secondary | ICD-10-CM | POA: Diagnosis not present

## 2015-06-17 DIAGNOSIS — R4189 Other symptoms and signs involving cognitive functions and awareness: Secondary | ICD-10-CM | POA: Diagnosis not present

## 2015-06-17 DIAGNOSIS — G3184 Mild cognitive impairment, so stated: Secondary | ICD-10-CM | POA: Diagnosis not present

## 2015-06-17 DIAGNOSIS — M170 Bilateral primary osteoarthritis of knee: Secondary | ICD-10-CM | POA: Diagnosis not present

## 2015-06-18 DIAGNOSIS — Z23 Encounter for immunization: Secondary | ICD-10-CM | POA: Diagnosis not present

## 2015-06-18 NOTE — H&P (Signed)
NAME:  Melanie Freeman, Melanie Freeman NO.:  [id number]  MEDICAL RECORD NO.:  [id number]  LOCATION:                                 FACILITY:  PHYSICIAN:  [name], M.D.        DATE OF BIRTH:  DATE OF ADMISSION: DATE OF DISCHARGE:                             HISTORY & PHYSICAL   Date of her surgery is June 26, 2015, at [facility] here in [place].  The patient is an 81 year old postmenopausal patient, who presented to our office with postmenopausal bleeding.  Saline infusion ultrasound did reveal an endometrial polyp.  She now presents for hysteroscopic resection of the endometrial polyp.  ALLERGIES:  In terms of allergies, she has no known drug allergies.  MEDICATIONS:  She is on Evista, Myrbetriq, calcium, and supplements.  PAST MEDICAL HISTORY:  She has a history of arthritis, urinary tract infections, history of hepatitis in the past.  PAST SURGICAL HISTORY:  She has had previous hip surgery.  SOCIAL HISTORY:  No tobacco or alcohol use.  REVIEW OF SYSTEMS:  Noncontributory.  PHYSICAL EXAMINATION:  VITAL SIGNS:  The patient is afebrile.  Stable vital signs. HEENT:  The patient is normocephalic.  Pupils equal, round, and reactive to light and accommodation.  Extraocular movements were intact.  Sclerae and conjunctivae were clear.  Oropharynx clear. NECK:  Without thyromegaly. BREASTS:  Not examined. LUNGS:  Clear. CARDIOVASCULAR:  Regular rhythm and rate.  There are no murmurs or gallops. ABDOMEN:  Benign.  No mass, organomegaly, or tenderness. PELVIC:  Normal external genitalia.  Vaginal mucosa is clear.  Cervix unremarkable.  Uterus normal size, shape, and contour.  Adnexa free of mass or tenderness. EXTREMITIES:  Trace edema. NEUROLOGIC:  Grossly within normal limits.  IMPRESSION:  Postmenopausal bleeding with endometrial polyp.  PLAN:  The patient will undergo hysteroscopy with D and C.  The risks of surgery have been discussed  including the risk of infection.  The risk of hemorrhage that could require transfusion with the risk of AIDS or hepatitis.  Risk of perforation with injury to adjacent organs requiring further exploratory surgery.  Risk of deep venous thrombosis and pulmonary embolus.  The patient expressed understanding of potential risks and complications as well as indications.     [name], M.D.     JSM/MEDQ  D:  06/18/2015  T:  06/18/2015  Job:  [id number]

## 2015-06-18 NOTE — H&P (Signed)
  Patient name Melanie Freeman, Aron DICTATION# [id number] CSN# [id number]  [name], MD 06/18/2015 5:02 PM

## 2015-06-19 DIAGNOSIS — M6281 Muscle weakness (generalized): Secondary | ICD-10-CM | POA: Diagnosis not present

## 2015-06-19 DIAGNOSIS — M25512 Pain in left shoulder: Secondary | ICD-10-CM | POA: Diagnosis not present

## 2015-06-19 DIAGNOSIS — M170 Bilateral primary osteoarthritis of knee: Secondary | ICD-10-CM | POA: Diagnosis not present

## 2015-06-19 DIAGNOSIS — R4189 Other symptoms and signs involving cognitive functions and awareness: Secondary | ICD-10-CM | POA: Diagnosis not present

## 2015-06-19 DIAGNOSIS — G3184 Mild cognitive impairment, so stated: Secondary | ICD-10-CM | POA: Diagnosis not present

## 2015-06-19 DIAGNOSIS — R2681 Unsteadiness on feet: Secondary | ICD-10-CM | POA: Diagnosis not present

## 2015-06-22 DIAGNOSIS — M170 Bilateral primary osteoarthritis of knee: Secondary | ICD-10-CM | POA: Diagnosis not present

## 2015-06-22 DIAGNOSIS — R2681 Unsteadiness on feet: Secondary | ICD-10-CM | POA: Diagnosis not present

## 2015-06-22 DIAGNOSIS — R4189 Other symptoms and signs involving cognitive functions and awareness: Secondary | ICD-10-CM | POA: Diagnosis not present

## 2015-06-22 DIAGNOSIS — G3184 Mild cognitive impairment, so stated: Secondary | ICD-10-CM | POA: Diagnosis not present

## 2015-06-22 DIAGNOSIS — M25512 Pain in left shoulder: Secondary | ICD-10-CM | POA: Diagnosis not present

## 2015-06-22 DIAGNOSIS — M6281 Muscle weakness (generalized): Secondary | ICD-10-CM | POA: Diagnosis not present

## 2015-06-23 DIAGNOSIS — G3184 Mild cognitive impairment, so stated: Secondary | ICD-10-CM | POA: Diagnosis not present

## 2015-06-23 DIAGNOSIS — M25512 Pain in left shoulder: Secondary | ICD-10-CM | POA: Diagnosis not present

## 2015-06-23 DIAGNOSIS — M6281 Muscle weakness (generalized): Secondary | ICD-10-CM | POA: Diagnosis not present

## 2015-06-23 DIAGNOSIS — R4189 Other symptoms and signs involving cognitive functions and awareness: Secondary | ICD-10-CM | POA: Diagnosis not present

## 2015-06-23 DIAGNOSIS — M170 Bilateral primary osteoarthritis of knee: Secondary | ICD-10-CM | POA: Diagnosis not present

## 2015-06-23 DIAGNOSIS — R2681 Unsteadiness on feet: Secondary | ICD-10-CM | POA: Diagnosis not present

## 2015-06-24 ENCOUNTER — Encounter (HOSPITAL_COMMUNITY): Payer: Self-pay

## 2015-06-24 ENCOUNTER — Encounter (HOSPITAL_COMMUNITY)
Admission: RE | Admit: 2015-06-24 | Discharge: 2015-06-24 | Disposition: A | Discharge status: Home / Self Care | Payer: Medicare Other | Source: Ambulatory Visit | Attending: Obstetrics and Gynecology | Admitting: Obstetrics and Gynecology

## 2015-06-24 ENCOUNTER — Other Ambulatory Visit: Payer: Self-pay

## 2015-06-24 DIAGNOSIS — N950 Postmenopausal bleeding: Secondary | ICD-10-CM | POA: Diagnosis not present

## 2015-06-24 DIAGNOSIS — Z91040 Latex allergy status: Secondary | ICD-10-CM | POA: Diagnosis not present

## 2015-06-24 DIAGNOSIS — Z881 Allergy status to other antibiotic agents status: Secondary | ICD-10-CM | POA: Diagnosis not present

## 2015-06-24 DIAGNOSIS — M1990 Unspecified osteoarthritis, unspecified site: Secondary | ICD-10-CM | POA: Diagnosis not present

## 2015-06-24 DIAGNOSIS — Z888 Allergy status to other drugs, medicaments and biological substances status: Secondary | ICD-10-CM | POA: Diagnosis not present

## 2015-06-24 DIAGNOSIS — N840 Polyp of corpus uteri: Secondary | ICD-10-CM | POA: Diagnosis not present

## 2015-06-24 DIAGNOSIS — K759 Inflammatory liver disease, unspecified: Secondary | ICD-10-CM | POA: Diagnosis not present

## 2015-06-24 DIAGNOSIS — Z882 Allergy status to sulfonamides status: Secondary | ICD-10-CM | POA: Diagnosis not present

## 2015-06-24 LAB — CBC
HCT: 40.1 % (ref 36.0–46.0)
HEMOGLOBIN: 12.8 g/dL (ref 12.0–15.0)
MCH: 30.6 pg (ref 26.0–34.0)
MCHC: 31.9 g/dL (ref 30.0–36.0)
MCV: 95.9 fL (ref 78.0–100.0)
PLATELETS: 171 10*3/uL (ref 150–400)
RBC: 4.18 MIL/uL (ref 3.87–5.11)
RDW: 13.9 % (ref 11.5–15.5)
WBC: 6.6 10*3/uL (ref 4.0–10.5)

## 2015-06-24 NOTE — Patient Instructions (Addendum)
   Your procedure is scheduled on: OCT 28 (FRIDAY)  AT 830AM  Enter through the Main Entrance of [facility] at: [place] up the phone at the desk and dial [phone] and inform us of your arrival.  Please call this number if you have any problems the morning of surgery: [phone]  DO NOT EAT OR DRINK AFTER MIDNIGHT OCT 27 (THURSDAY)   Do not wear jewelry, make-up, or FINGER nail polish No metal in your hair or on your body. Do not wear lotions, powders, perfumes.  You may wear deodorant.  Do not bring valuables to the hospital. Contacts, dentures or bridgework may not be worn into surgery. .    Patients discharged on the day of surgery will not be allowed to drive home.

## 2015-06-25 NOTE — Anesthesia Preprocedure Evaluation (Addendum)
Anesthesia Evaluation  Patient identified by MRN, date of birth, ID band Patient awake    Reviewed: Allergy & Precautions, NPO status , Patient's Chart, lab work & pertinent test results  Airway Mallampati: II  TM Distance: >3 FB Neck ROM: Full    Dental  (+) Upper Dentures, Caps,    Pulmonary neg pulmonary ROS,    Pulmonary exam normal breath sounds clear to auscultation       Cardiovascular negative cardio ROS Normal cardiovascular exam Rhythm:Regular Rate:Normal     Neuro/Psych  Neuromuscular disease negative psych ROS   GI/Hepatic negative GI ROS, (+) Hepatitis -  Endo/Other  negative endocrine ROS  Renal/GU negative Renal ROS  negative genitourinary   Musculoskeletal  (+) Arthritis ,   Abdominal   Peds negative pediatric ROS (+)  Hematology negative hematology ROS (+)   Anesthesia Other Findings   Reproductive/Obstetrics negative OB ROS                            Lab Results  Component Value Date   WBC 6.6 06/24/2015   HGB 12.8 06/24/2015   HCT 40.1 06/24/2015   MCV 95.9 06/24/2015   PLT 171 06/24/2015   No results found for: CREATININE, BUN, NA, K, CL, CO2 No results found for: INR, PROTIME  EKG: normal sinus rhythm.   Anesthesia Physical Anesthesia Plan  ASA: II  Anesthesia Plan: MAC   Post-op Pain Management:    Induction: Intravenous  Airway Management Planned:   Additional Equipment:   Intra-op Plan:   Post-operative Plan:   Informed Consent: I have reviewed the patients History and Physical, chart, labs and discussed the procedure including the risks, benefits and alternatives for the proposed anesthesia with the patient or authorized representative who has indicated his/her understanding and acceptance.   Dental advisory given  Plan Discussed with: CRNA, Anesthesiologist and Surgeon  Anesthesia Plan Comments:        Anesthesia Quick  Evaluation

## 2015-06-26 ENCOUNTER — Ambulatory Visit (HOSPITAL_COMMUNITY)
Admission: RE | Admit: 2015-06-26 | Discharge: 2015-06-26 | Disposition: A | Discharge status: Home / Self Care | Payer: Medicare Other | Source: Ambulatory Visit | Attending: Obstetrics and Gynecology | Admitting: Obstetrics and Gynecology

## 2015-06-26 ENCOUNTER — Encounter (HOSPITAL_COMMUNITY): Payer: Self-pay | Admitting: *Deleted

## 2015-06-26 ENCOUNTER — Ambulatory Visit (HOSPITAL_COMMUNITY): Payer: Medicare Other | Admitting: Anesthesiology

## 2015-06-26 ENCOUNTER — Encounter (HOSPITAL_COMMUNITY)
Admission: RE | Disposition: A | Discharge status: Home / Self Care | Payer: Self-pay | Source: Ambulatory Visit | Attending: Obstetrics and Gynecology

## 2015-06-26 DIAGNOSIS — Z881 Allergy status to other antibiotic agents status: Secondary | ICD-10-CM | POA: Diagnosis not present

## 2015-06-26 DIAGNOSIS — N950 Postmenopausal bleeding: Secondary | ICD-10-CM | POA: Diagnosis not present

## 2015-06-26 DIAGNOSIS — M1990 Unspecified osteoarthritis, unspecified site: Secondary | ICD-10-CM | POA: Insufficient documentation

## 2015-06-26 DIAGNOSIS — K759 Inflammatory liver disease, unspecified: Secondary | ICD-10-CM | POA: Diagnosis not present

## 2015-06-26 DIAGNOSIS — Z882 Allergy status to sulfonamides status: Secondary | ICD-10-CM | POA: Insufficient documentation

## 2015-06-26 DIAGNOSIS — Z91040 Latex allergy status: Secondary | ICD-10-CM | POA: Insufficient documentation

## 2015-06-26 DIAGNOSIS — N840 Polyp of corpus uteri: Secondary | ICD-10-CM | POA: Diagnosis not present

## 2015-06-26 DIAGNOSIS — N858 Other specified noninflammatory disorders of uterus: Secondary | ICD-10-CM | POA: Diagnosis not present

## 2015-06-26 DIAGNOSIS — Z888 Allergy status to other drugs, medicaments and biological substances status: Secondary | ICD-10-CM | POA: Insufficient documentation

## 2015-06-26 HISTORY — PX: DILATATION & CURETTAGE/HYSTEROSCOPY WITH MYOSURE: SHX6511

## 2015-06-26 SURGERY — DILATATION & CURETTAGE/HYSTEROSCOPY WITH MYOSURE
Anesthesia: Monitor Anesthesia Care

## 2015-06-26 MED ORDER — OXYCODONE-ACETAMINOPHEN 5-325 MG PO TABS: 1.0000 | ORAL_TABLET | Freq: Four times a day (QID) | ORAL | Status: DC | PRN

## 2015-06-26 MED ORDER — LIDOCAINE HCL 1 % IJ SOLN
INTRAMUSCULAR | Status: DC | PRN
  Administered 2015-06-26: 20 mL

## 2015-06-26 MED ORDER — FENTANYL CITRATE (PF) 100 MCG/2ML IJ SOLN
INTRAMUSCULAR | Status: DC | PRN
  Administered 2015-06-26: 50 ug via INTRAVENOUS

## 2015-06-26 MED ORDER — MEPERIDINE HCL 25 MG/ML IJ SOLN: 6.2500 mg | INTRAMUSCULAR | Status: DC | PRN

## 2015-06-26 MED ORDER — FENTANYL CITRATE (PF) 100 MCG/2ML IJ SOLN: 25.0000 ug | INTRAMUSCULAR | Status: DC | PRN

## 2015-06-26 MED ORDER — ONDANSETRON HCL 4 MG/2ML IJ SOLN
INTRAMUSCULAR | Status: DC | PRN
  Administered 2015-06-26: 4 mg via INTRAVENOUS

## 2015-06-26 MED ORDER — PROPOFOL 10 MG/ML IV BOLUS
INTRAVENOUS | Status: DC | PRN
  Administered 2015-06-26 (×2): 20 mg via INTRAVENOUS
  Administered 2015-06-26: 30 mg via INTRAVENOUS
  Administered 2015-06-26: 20 mg via INTRAVENOUS

## 2015-06-26 MED ORDER — SODIUM CHLORIDE 0.9 % IR SOLN
Status: DC | PRN
  Administered 2015-06-26: 3000 mL

## 2015-06-26 MED ORDER — ONDANSETRON HCL 4 MG/2ML IJ SOLN
INTRAMUSCULAR | Status: AC
Start: 2015-06-26 — End: 2015-06-26
  Filled 2015-06-26: qty 2

## 2015-06-26 MED ORDER — LACTATED RINGERS IV SOLN
INTRAVENOUS | Status: DC
Start: 2015-06-26 — End: 2015-06-26

## 2015-06-26 MED ORDER — PROMETHAZINE HCL 25 MG/ML IJ SOLN: 6.2500 mg | INTRAMUSCULAR | Status: DC | PRN

## 2015-06-26 MED ORDER — DEXAMETHASONE SODIUM PHOSPHATE 4 MG/ML IJ SOLN
INTRAMUSCULAR | Status: AC
  Filled 2015-06-26: qty 1

## 2015-06-26 MED ORDER — CEFAZOLIN SODIUM-DEXTROSE 2-3 GM-% IV SOLR
INTRAVENOUS | Status: AC
  Filled 2015-06-26: qty 50

## 2015-06-26 MED ORDER — CEFAZOLIN SODIUM-DEXTROSE 2-3 GM-% IV SOLR
2.0000 g | INTRAVENOUS | Status: AC
  Administered 2015-06-26: 2 g via INTRAVENOUS

## 2015-06-26 MED ORDER — LACTATED RINGERS IV SOLN
INTRAVENOUS | Status: DC
  Administered 2015-06-26: 08:00:00 via INTRAVENOUS

## 2015-06-26 MED ORDER — LIDOCAINE HCL (CARDIAC) 20 MG/ML IV SOLN
INTRAVENOUS | Status: DC | PRN
  Administered 2015-06-26: 50 mg via INTRAVENOUS

## 2015-06-26 MED ORDER — CEFAZOLIN SODIUM-DEXTROSE 2-3 GM-% IV SOLR: 2.0000 g | INTRAVENOUS | Status: DC

## 2015-06-26 MED ORDER — LIDOCAINE HCL 1 % IJ SOLN
INTRAMUSCULAR | Status: AC
  Filled 2015-06-26: qty 20

## 2015-06-26 MED ORDER — FENTANYL CITRATE (PF) 100 MCG/2ML IJ SOLN
INTRAMUSCULAR | Status: AC
  Filled 2015-06-26: qty 4

## 2015-06-26 MED ORDER — LIDOCAINE HCL (CARDIAC) 20 MG/ML IV SOLN
INTRAVENOUS | Status: AC
  Filled 2015-06-26: qty 5

## 2015-06-26 SURGICAL SUPPLY — 15 items
CATH ROBINSON RED A/P 16FR (CATHETERS) ×3 IMPLANT
CLOTH BEACON ORANGE TIMEOUT ST (SAFETY) ×3 IMPLANT
CONTAINER PREFILL 10% NBF 60ML (FORM) ×6 IMPLANT
DEVICE MYOSURE LITE (MISCELLANEOUS) IMPLANT
DEVICE MYOSURE REACH (MISCELLANEOUS) ×3 IMPLANT
FILTER ARTHROSCOPY CONVERTOR (FILTER) ×3 IMPLANT
GLOVE BIO SURGEON STRL SZ7 (GLOVE) ×6 IMPLANT
GOWN STRL REUS W/TWL LRG LVL3 (GOWN DISPOSABLE) ×6 IMPLANT
PACK VAGINAL MINOR WOMEN LF (CUSTOM PROCEDURE TRAY) ×3 IMPLANT
PAD OB MATERNITY 4.3X12.25 (PERSONAL CARE ITEMS) ×3 IMPLANT
SEAL ROD LENS SCOPE MYOSURE (ABLATOR) ×3 IMPLANT
TOWEL OR 17X24 6PK STRL BLUE (TOWEL DISPOSABLE) ×6 IMPLANT
TUBING AQUILEX INFLOW (TUBING) ×3 IMPLANT
TUBING AQUILEX OUTFLOW (TUBING) ×3 IMPLANT
WATER STERILE IRR 1000ML POUR (IV SOLUTION) ×3 IMPLANT

## 2015-06-26 NOTE — Op Note (Signed)
NAMEZEPPELIN, BECKSTRAND NO.:  [id number]  MEDICAL RECORD NO.:  [id number]  LOCATION:  [facility]                          FACILITY:  [facility]  PHYSICIAN:  [name], M.D.   DATE OF BIRTH:  08-05-1929  DATE OF PROCEDURE:  06/26/2015 DATE OF DISCHARGE:  06/26/2015                              OPERATIVE REPORT   PREOPERATIVE DIAGNOSIS:  Postmenopausal bleeding with endometrial buildup.  POSTOPERATIVE DIAGNOSIS:  Postmenopausal bleeding with endometrial buildup.  OPERATIVE PROCEDURE:  Paracervical block, cervical dilatation, hysteroscopy, MyoSure resection of endometrium, and endometrial curettings.  SURGEON:  [name], M.D.  ANESTHESIA:  Paracervical block and sedation.  BLOOD LOSS:  Minimal.  PACKS AND DRAINS:  None.  INTRAOPERATIVE BLOOD PLACED:  None.  COMPLICATIONS:  None.  INDICATIONS:  Dictated in the history and physical.  PROCEDURE IN DETAIL:  The patient was taken to the OR and placed in supine position.  She was then placed in the dorsal lithotomy position using the Allen stirrups.  The patient was given sedation.  Perineum and vagina were prepped out with Betadine and draped in sterile field.  A speculum was placed in vaginal vault.  Cervix was grasped with a single- tooth tenaculum.  Paracervical blocks were instituted using 1% Xylocaine.  Uterus was sounded to approximately 7 cm.  Cervix was dilated to a 25 Pratt dilator.  Hysteroscope was introduced. Intrauterine cavity was distended using saline visualization.  She had a very atrophic endometrium.  There were some irregularities too, which probably was seen on the saline infusion.  No polyps or other abnormalities were noted.  We did bring in the MyoSure, resected some of the endometrial lining and sent that for Pathology.  Endometrial curettings were also obtained.  There were no signs of perforation during the procedure.  No complications were encountered.  No active bleeding noted.   Single-tooth speculum was then removed.  The patient was taken out of dorsal lithotomy position.  Once alert, transferred to recovery room in good condition.  Sponge, instrument, and needle count was correct by circulating nurse.     [name], M.D.     JSM/MEDQ  D:  06/26/2015  T:  06/26/2015  Job:  [id number]

## 2015-06-26 NOTE — Discharge Instructions (Signed)
DISCHARGE INSTRUCTIONS: HYSTEROSCOPY  °The following instructions have been prepared to help you care for yourself upon your return home. ° °May take stool softner while taking narcotic pain medication to prevent constipation.  Drink plenty of water. ° °Personal hygiene: °• Use sanitary pads for vaginal drainage, not tampons. °• Shower the day after your procedure. °• NO tub baths, pools or Jacuzzis for 2-3 weeks. °• Wipe front to back after using the bathroom. ° °Activity and limitations: °• Do NOT drive or operate any equipment for 24 hours. The effects of anesthesia are still present °and drowsiness may result. °• Do NOT rest in bed all day. °• Walking is encouraged. °• Walk up and down stairs slowly. °• You may resume your normal activity in one to two days or as indicated by your physician. °Sexual activity: NO intercourse for at least 2 weeks after the procedure, or as indicated by your °Doctor. ° °Diet: Eat a light meal as desired this evening. You may resume your usual diet tomorrow. ° °Return to Work: You may resume your work activities in one to two days or as indicated by your °Doctor. ° °What to expect after your surgery: Expect to have vaginal bleeding/discharge for 2-3 days and °spotting for up to 10 days. It is not unusual to have soreness for up to 1-2 weeks. You may have a °slight burning sensation when you urinate for the first day. Mild cramps may continue for a couple of °days. You may have a regular period in 2-6 weeks. ° °Call your doctor for any of the following: °• Excessive vaginal bleeding or clotting, saturating and changing one pad every hour. °• Inability to urinate 6 hours after discharge from hospital. °• Pain not relieved by pain medication. °• Fever of 100.4° F or greater. °• Unusual vaginal discharge or odor. ° °Return to office _________________Call for an appointment ___________________ °Patient’s signature: ______________________ °Nurse’s signature  ________________________ ° °Post Anesthesia Care Unit [phone] °

## 2015-06-26 NOTE — Anesthesia Postprocedure Evaluation (Signed)
  Anesthesia Post-op Note  Patient: Melanie Freeman  Procedure(s) Performed: Procedure(s): DILATATION & CURETTAGE/HYSTEROSCOPY WITH MYOSURE (N/A)  Patient Location: PACU  Anesthesia Type:MAC  Level of Consciousness: awake, alert  and oriented  Airway and Oxygen Therapy: Patient Spontanous Breathing  Post-op Pain: none  Post-op Assessment: Post-op Vital signs reviewed, Patient's Cardiovascular Status Stable, Respiratory Function Stable, Patent Airway, No signs of Nausea or vomiting and Pain level controlled              Post-op Vital Signs: Reviewed and stable  Last Vitals:  Filed Vitals:   06/26/15 0930  BP: 129/77  Pulse: 70  Temp:   Resp: 13    Complications: No apparent anesthesia complications

## 2015-06-26 NOTE — Brief Op Note (Signed)
06/26/2015  8:50 AM  PATIENT:  Melanie Freeman  81 y.o. female  PRE-OPERATIVE DIAGNOSIS:  polyp  POST-OPERATIVE DIAGNOSIS:  * No post-op diagnosis entered *  PROCEDURE:  Procedure(s): DILATATION & CURETTAGE/HYSTEROSCOPY WITH MYOSURE (N/A)  SURGEON:  Surgeon(s) and Role:    * Arvella Nigh, MD - Primary  PHYSICIAN ASSISTANT:   ASSISTANTS: none   ANESTHESIA:   IV sedation and paracervical block  EBL:  Total I/O In: 700 [I.V.:700] Out: 10 [Blood:10]  BLOOD ADMINISTERED:none  DRAINS: none   LOCAL MEDICATIONS USED:  XYLOCAINE   SPECIMEN:  Source of Specimen:  endometrial resections and currettings  DISPOSITION OF SPECIMEN:  PATHOLOGY  COUNTS:  YES  TOURNIQUET:  * No tourniquets in log *  DICTATION: .Other Dictation: Dictation Number [id number]  PLAN OF CARE: Discharge to home after PACU  PATIENT DISPOSITION:  PACU - hemodynamically stable.   Delay start of Pharmacological VTE agent (>24hrs) due to surgical blood loss or risk of bleeding: not applicable

## 2015-06-26 NOTE — Transfer of Care (Signed)
Immediate Anesthesia Transfer of Care Note  Patient: Melanie Freeman  Procedure(s) Performed: Procedure(s): DILATATION & CURETTAGE/HYSTEROSCOPY WITH MYOSURE (N/A)  Patient Location: PACU  Anesthesia Type:MAC  Level of Consciousness: awake, alert  and oriented  Airway & Oxygen Therapy: Patient Spontanous Breathing  Post-op Assessment: Report given to RN and Post -op Vital signs reviewed and stable  Post vital signs: Reviewed and stable  Last Vitals:  Filed Vitals:   06/26/15 0711  BP: 146/82  Pulse: 82  Temp: 36.6 C  Resp: 18    Complications: No apparent anesthesia complications

## 2015-06-26 NOTE — H&P (Signed)
  History and physical exam unchanged 

## 2015-06-30 ENCOUNTER — Encounter (HOSPITAL_COMMUNITY): Payer: Self-pay | Admitting: Obstetrics and Gynecology

## 2015-08-12 ENCOUNTER — Encounter: Payer: Self-pay | Admitting: Internal Medicine

## 2015-08-12 ENCOUNTER — Non-Acute Institutional Stay: Payer: Medicare Other | Admitting: Internal Medicine

## 2015-08-12 VITALS — BP 100/68 | HR 72 | Temp 97.6°F | Ht 60.0 in | Wt 106.0 lb

## 2015-08-12 DIAGNOSIS — G629 Polyneuropathy, unspecified: Secondary | ICD-10-CM | POA: Insufficient documentation

## 2015-08-12 DIAGNOSIS — G6289 Other specified polyneuropathies: Secondary | ICD-10-CM

## 2015-08-12 DIAGNOSIS — L239 Allergic contact dermatitis, unspecified cause: Secondary | ICD-10-CM | POA: Diagnosis not present

## 2015-08-12 DIAGNOSIS — F411 Generalized anxiety disorder: Secondary | ICD-10-CM

## 2015-08-12 DIAGNOSIS — F0390 Unspecified dementia without behavioral disturbance: Secondary | ICD-10-CM

## 2015-08-12 DIAGNOSIS — E559 Vitamin D deficiency, unspecified: Secondary | ICD-10-CM | POA: Diagnosis not present

## 2015-08-12 DIAGNOSIS — F429 Obsessive-compulsive disorder, unspecified: Secondary | ICD-10-CM

## 2015-08-12 DIAGNOSIS — N3281 Overactive bladder: Secondary | ICD-10-CM | POA: Diagnosis not present

## 2015-08-12 DIAGNOSIS — E538 Deficiency of other specified B group vitamins: Secondary | ICD-10-CM | POA: Diagnosis not present

## 2015-08-12 DIAGNOSIS — N950 Postmenopausal bleeding: Secondary | ICD-10-CM

## 2015-08-12 NOTE — Progress Notes (Signed)
Patient ID: Melanie Freeman, female   DOB: 03/14/1929, 81 y.o.   MRN: [id number] MMSE 29/30 Failed clock drawing

## 2015-08-12 NOTE — Progress Notes (Signed)
Patient ID: Melanie Freeman, female   DOB: 1929/03/14, 81 y.o.   MRN: [id number]   Location: [facility] Provider: Francile Woolford L. Mariea Clonts, D.O., C.M.D.  Code Status: DNR Goals of Care: Advanced Directive information Does patient have an advance directive?: Yes, Type of Advance Directive: [place];Living will;Out of facility DNR (pink MOST or yellow form), Pre-existing out of facility DNR order (yellow form or pink MOST form): Yellow form placed in chart (order not valid for inpatient use)  Chief Complaint  Patient presents with  . Medical Management of Chronic Issues    medication management, here with daughter Eustaquio Maize  . surgery    06/26/15 D&C postmenopausal bleeding wieth endometrial     HPI: Patient is a 81 y.o. female seen in the office today for medical mgt of chronic diseases.  She underwent D&C for postmenopausal bleeding.  This went very well and she's had no related problems since.  Upon speaking with Eustaquio Maize, her daughter, I learned that pt has been very anxious and obsessive over her skin condition--she claimed to be allergic to a pure cotton pair of PJs that her daughter bought her--said she reacted to them for days and picked them up using her grabber tool.  Won't wear a winter coat due to this.  She talks about it all of the time. She also has been obsessing over her cell phone not working, but it worked for her daughter just fine when she tried so she thinks she is not able to figure out how to use it. She is not managing her finances on her own due to errors.  She also has been very confused about using her remote controls for her TV and it took several weeks to get it to where they worked.  Her daughter is going to look more closely at her meds b/c she thinks she is taking them right, but explained that these other functional losses suggest that too could be impaired.  During her visit, she told me the same stories as her other visits about her pneumonia vaccine  in her left arm causing chronic pain, her left hip injury after the replacement, her arthritis in her knees, seeing her orthopedist that treated her mother.    She completed her PT for her gait. She is not using a walker but a cane today.  She nearly fell when her rubber-soled sneaker got stuck on the floor.   She could not clearly answer my questions about how her OAB was doing with the myrbetriq these days--would just say she is overdue to see urology.  Told me about getting antibiotic shots in her bottom for her frequent infections while in [place].    Review of Systems:  Review of Systems  Constitutional: Negative for fever and chills.  HENT: Positive for hearing loss.        Sinus and stress-related.  Eyes: Negative for blurred vision.       Glasses  Respiratory: Negative for cough and shortness of breath.   Cardiovascular: Negative for chest pain.  Gastrointestinal: Negative for abdominal pain, constipation, blood in stool and melena.  Genitourinary: Positive for urgency and frequency. Negative for dysuria.  Musculoskeletal: Negative for falls.  Skin: Positive for itching and rash.  Neurological: Positive for headaches. Negative for dizziness.  Endo/Heme/Allergies: Positive for environmental allergies.  Psychiatric/Behavioral: Positive for memory loss. The patient is nervous/anxious. The patient does not have insomnia.     Past Medical History  Diagnosis Date  . Cystitis, interstitial   .  Tremors of nervous system   . Allergic contact dermatitis   . Vertigo   . Arthritis   . Osteopenia   . Hepatitis     over 50 years ago    Past Surgical History  Procedure Laterality Date  . Tonsillectomy    . Appendectomy    . Hip orif w/ capsulotomy Left 10/22/2013    [name]   . Fracture surgery  2015    left hip fracture  . Dilatation & curettage/hysteroscopy with myosure N/A 06/26/2015    Procedure: DILATATION & CURETTAGE/HYSTEROSCOPY WITH MYOSURE;  Surgeon: Arvella Nigh, MD;   Location: [facility];  Service: Gynecology;  Laterality: N/A;    Allergies  Allergen Reactions  . Latex Rash  . Ciprofloxacin Other (See Comments)    Doesn't work  . Doxycycline Other (See Comments)    unknown  . Levaquin [Levofloxacin In D5w] Other (See Comments)    unknown  . Lopid [Gemfibrozil]   . Macrobid [employer] Macro] Other (See Comments)    Doesn't work  . Sulfa Antibiotics Other (See Comments)    unknown      Medication List       This list is accurate as of: 08/12/15  4:44 PM.  Always use your most recent med list.               CALCIUM 600+D3 600-800 MG-UNIT Tabs  Generic drug:  Calcium Carb-Cholecalciferol  Take 1 tablet by mouth daily. Take one tablet daily     D3 HIGH POTENCY 1000 UNITS capsule  Generic drug:  Cholecalciferol  Take 1,000 Units by mouth daily. Take one tablet daily     ELIDEL 1 % cream  Generic drug:  pimecrolimus  Apply 1 application topically 2 (two) times daily as needed (skin rash).     Fish Oil 1200 MG Caps  Take 2 capsules by mouth daily. Take one tablet twice daily     fluocinolone 0.01 % cream  Commonly known as:  VANOS  Apply 1 application topically 2 (two) times daily as needed (skin rash).     Magnesium 400 MG Caps  Take 1 tablet by mouth daily. Take one tablet daily     MYRBETRIQ 50 MG Tb24 tablet  Generic drug:  mirabegron ER  Take 50 mg by mouth daily. Take one tablet daily for bladder     oxyCODONE-acetaminophen 5-325 MG tablet  Commonly known as:  ROXICET  Take 1 tablet by mouth every 6 (six) hours as needed for severe pain.     polyethylene glycol packet  Commonly known as:  MIRALAX / GLYCOLAX  Take 17 g by mouth daily as needed for mild constipation. Use as needed     raloxifene 60 MG tablet  Commonly known as:  EVISTA  Take 60 mg by mouth daily. Take one table daily     tacrolimus 0.1 % ointment  Commonly known as:  PROTOPIC  Apply 1 application topically 2 (two) times daily as needed  (skin rash).     trimethoprim 100 MG tablet  Commonly known as:  TRIMPEX  Take 100 mg by mouth once.     TYLENOL ARTHRITIS PAIN 650 MG CR tablet  Generic drug:  acetaminophen  Take 650 mg by mouth every 8 (eight) hours as needed for pain.     acetaminophen 325 MG tablet  Commonly known as:  TYLENOL  Take 325 mg by mouth every 6 (six) hours as needed for headache.     vitamin B-12  1000 MCG tablet  Commonly known as:  CYANOCOBALAMIN  Take 1,000 mcg by mouth daily. Take one tablet daily        Health Maintenance  Topic Date Due  . TETANUS/TDAP  07/20/1950  . ZOSTAVAX  07/21/1991  . DEXA SCAN  07/20/1996  . PNA vac Low Risk Adult (1 of 2 - PCV13) 07/20/1996  . INFLUENZA VACCINE  03/29/2016    Physical Exam: Filed Vitals:   08/12/15 1638  BP: 100/68  Pulse: 72  Temp: 97.6 F (36.4 C)  TempSrc: Oral  Height: 5' (1.524 m)  Weight: 106 lb (48.081 kg)  SpO2: 97%   Body mass index is 20.7 kg/(m^2). Physical Exam  Constitutional:  Thin white female  Cardiovascular: Normal rate, regular rhythm and normal heart sounds.   Pulmonary/Chest: Effort normal and breath sounds normal. No respiratory distress.  Abdominal: Soft. Bowel sounds are normal.  Musculoskeletal: She exhibits tenderness.  Over left lateral thigh  Neurological:  Tremor of head; unsteady gait, using cane (but not using properly, carries it a lot)  Skin: Skin is warm and dry. Rash noted.  Skin of neck appears thickened and red from itching  Psychiatric:  anxious    Labs reviewed: Basic Metabolic Panel: No results for input(s): NA, K, CL, CO2, GLUCOSE, BUN, CREATININE, CALCIUM, MG, PHOS, TSH in the last 8760 hours. Liver Function Tests: No results for input(s): AST, ALT, ALKPHOS, BILITOT, PROT, ALBUMIN in the last 8760 hours. No results for input(s): LIPASE, AMYLASE in the last 8760 hours. No results for input(s): AMMONIA in the last 8760 hours. CBC:  Recent Labs  06/24/15 0815  WBC 6.6  HGB 12.8    HCT 40.1  MCV 95.9  PLT 171   Lipid Panel: No results for input(s): CHOL, HDL, LDLCALC, TRIG, CHOLHDL, LDLDIRECT in the last 8760 hours. No results found for: HGBA1C  Assessment/Plan 1. Postmenopausal vaginal bleeding -s/p D&C which sounds like it was a success  2. Overactive bladder -cont myrbetriq and f/u with urology as planned  3. Allergic contact dermatitis due to multiple agents -pt obsessed with this -says she can only wear pure cotton as a result -needs repeat allergy testing--suggested today, but she started discussing other things  4. Anxiety state -would benefit from zoloft for this and OCD behaviors, but pt does not want any meds for these due to stigma, thinking she is fine  5. OCD (obsessive compulsive disorder) -seems to contribute to her allergy concerns and affects her functioning -needs medication, but I could not come up with a convincing approach today  6. Other polyneuropathy ([place]) -continues on b12 supplement, f/u labs  7. Dementia, without behavioral disturbance -scores well on mmse 29/30 missing today's date and failing clock drawing -discussed that her functional losses with use of the remote and phone, finances suggest early dementia and inability to draw clock -this can also be affected by #4 and 5 -will further discuss over time, but pt lacks insight into her memory loss and her anxiety/OCD to some degree  8. Vitamin D deficiency -cont vitamin D supplement, f/u level as this has not been done at all since I've seen her  -continues evista for OP  9. B12 deficiency -f/u level, taking for her neuropathy  Labs/tests ordered:  Cbc, cmp, b12/folate, vit D Next appt:  11/25/2015 for med mgt  Jonice Cerra L. Rillie Riffel, D.O. [facility] [address], [place] [address] Cell Phone (Mon-Fri 8am-5pm):  [phone] On Call:  [phone] & follow prompts after 5pm & weekends Office Phone:   [phone] Office Fax:  [phone]

## 2015-08-19 DIAGNOSIS — R351 Nocturia: Secondary | ICD-10-CM | POA: Diagnosis not present

## 2015-08-19 DIAGNOSIS — N3020 Other chronic cystitis without hematuria: Secondary | ICD-10-CM | POA: Diagnosis not present

## 2015-08-19 DIAGNOSIS — R350 Frequency of micturition: Secondary | ICD-10-CM | POA: Diagnosis not present

## 2015-09-15 DIAGNOSIS — R3989 Other symptoms and signs involving the genitourinary system: Secondary | ICD-10-CM | POA: Diagnosis not present

## 2015-09-15 DIAGNOSIS — N3020 Other chronic cystitis without hematuria: Secondary | ICD-10-CM | POA: Diagnosis not present

## 2015-09-15 DIAGNOSIS — Z0000 Encounter for general adult medical examination without abnormal findings: Secondary | ICD-10-CM | POA: Diagnosis not present

## 2015-10-22 DIAGNOSIS — N390 Urinary tract infection, site not specified: Secondary | ICD-10-CM | POA: Diagnosis not present

## 2015-10-22 DIAGNOSIS — Z0000 Encounter for general adult medical examination without abnormal findings: Secondary | ICD-10-CM | POA: Diagnosis not present

## 2015-10-22 DIAGNOSIS — B952 Enterococcus as the cause of diseases classified elsewhere: Secondary | ICD-10-CM | POA: Diagnosis not present

## 2015-11-16 DIAGNOSIS — N3020 Other chronic cystitis without hematuria: Secondary | ICD-10-CM | POA: Diagnosis not present

## 2015-11-16 DIAGNOSIS — N390 Urinary tract infection, site not specified: Secondary | ICD-10-CM | POA: Diagnosis not present

## 2015-11-16 DIAGNOSIS — R350 Frequency of micturition: Secondary | ICD-10-CM | POA: Diagnosis not present

## 2015-11-16 DIAGNOSIS — Z0000 Encounter for general adult medical examination without abnormal findings: Secondary | ICD-10-CM | POA: Diagnosis not present

## 2015-11-17 DIAGNOSIS — G629 Polyneuropathy, unspecified: Secondary | ICD-10-CM | POA: Diagnosis not present

## 2015-11-17 DIAGNOSIS — F0390 Unspecified dementia without behavioral disturbance: Secondary | ICD-10-CM | POA: Diagnosis not present

## 2015-11-17 DIAGNOSIS — N950 Postmenopausal bleeding: Secondary | ICD-10-CM | POA: Diagnosis not present

## 2015-11-17 DIAGNOSIS — Z79899 Other long term (current) drug therapy: Secondary | ICD-10-CM | POA: Diagnosis not present

## 2015-11-17 DIAGNOSIS — G6289 Other specified polyneuropathies: Secondary | ICD-10-CM | POA: Diagnosis not present

## 2015-11-17 DIAGNOSIS — E538 Deficiency of other specified B group vitamins: Secondary | ICD-10-CM | POA: Diagnosis not present

## 2015-11-20 LAB — HEPATIC FUNCTION PANEL
ALT: 8 U/L (ref 7–35)
AST: 22 U/L (ref 13–35)
Alkaline Phosphatase: 39 U/L (ref 25–125)
Bilirubin, Total: 0.6 mg/dL

## 2015-11-20 LAB — BASIC METABOLIC PANEL
BUN: 33 mg/dL — AB (ref 4–21)
Creatinine: 1.5 mg/dL — AB (ref <=1.1)
Glucose: 103 mg/dL
POTASSIUM: 4.3 mmol/L (ref 3.4–5.3)
Sodium: 138 mmol/L (ref 137–147)

## 2015-11-20 LAB — CBC AND DIFFERENTIAL
HCT: 39 % (ref 36–46)
HEMOGLOBIN: 13.5 g/dL (ref 12.0–16.0)
PLATELETS: 176 10*3/uL (ref 150–399)
WBC: 4.2 10*3/mL

## 2015-11-25 ENCOUNTER — Non-Acute Institutional Stay: Payer: Medicare Other | Admitting: Internal Medicine

## 2015-11-25 ENCOUNTER — Encounter: Payer: Self-pay | Admitting: Internal Medicine

## 2015-11-25 VITALS — BP 110/72 | HR 58 | Temp 97.8°F | Ht 60.0 in | Wt 107.0 lb

## 2015-11-25 DIAGNOSIS — N390 Urinary tract infection, site not specified: Secondary | ICD-10-CM

## 2015-11-25 DIAGNOSIS — N3281 Overactive bladder: Secondary | ICD-10-CM | POA: Diagnosis not present

## 2015-11-25 DIAGNOSIS — M810 Age-related osteoporosis without current pathological fracture: Secondary | ICD-10-CM | POA: Diagnosis not present

## 2015-11-25 DIAGNOSIS — G250 Essential tremor: Secondary | ICD-10-CM | POA: Diagnosis not present

## 2015-11-25 DIAGNOSIS — G6289 Other specified polyneuropathies: Secondary | ICD-10-CM | POA: Diagnosis not present

## 2015-11-25 DIAGNOSIS — F0390 Unspecified dementia without behavioral disturbance: Secondary | ICD-10-CM | POA: Diagnosis not present

## 2015-11-25 DIAGNOSIS — E538 Deficiency of other specified B group vitamins: Secondary | ICD-10-CM

## 2015-11-25 MED ORDER — RALOXIFENE HCL 60 MG PO TABS: 60.0000 mg | ORAL_TABLET | Freq: Every day | ORAL | Status: DC

## 2015-11-25 NOTE — Progress Notes (Signed)
Patient ID: Melanie Freeman, female   DOB: 05-11-1929, 82 y.o.   MRN: [id number]   Location:  [facility] (12)  Provider: Tajon Moring L. Mariea Clonts, D.O., C.M.D.  Code Status:   DNR  Goals of Care:  Advanced Directives 11/25/2015  Does patient have an advance directive? Yes  Type of Paramedic of [place];Living will;Out of facility DNR (pink MOST or yellow form)  Copy of advanced directive(s) in chart? -  Pre-existing out of facility DNR order (yellow form or pink MOST form) Yellow form placed in chart (order not valid for inpatient use)   Chief Complaint  Patient presents with  . Medical Management of Chronic Issues    medication management memory, OCD, anxiety    HPI: Patient is a 82 y.o. female seen today for medical management of chronic diseases including memory loss, OCD, anxiety.   Right eye is bothering her.  Had eyes tested last November.  No macular.  Had family history though. Had injured the right eye when she had syncope in [place] over a year ago.  Bothers her at times.    No falls.  Today, legs hurt more after she was out yesterday.  Has to walk farther with her parking space.  Still using her mom's walker.  Did grocery shopping yesterday.  Remains wobbly.    Still having problems with her bladder.  Has been getting treated for utis over and over.  Last had levaquin.  She says she gets somewhat better--had ache over pubic area, but when took medicine, it got a little better.    Is eating ok.  Gained 2 lbs.  Eats part of each food item at the time and then has part of it later at another meal.  Sounds like she is very picky.    Arthritis in hands especially at thumbs is getting worse.  Hands less painful at night if she wraps them around her to keep warm.    She is still waking up in the middle of the night worrying about things.  Also does initially wake up to urinate.    Has only driven her new car once in  the past few weeks.    She continues to refuse all further vaccinations due to a bad experience with ongoing pain in her left arm (sounds like her first pneumonia shot was put into the nerve). Past Medical History  Diagnosis Date  . Cystitis, interstitial   . Tremors of nervous system   . Allergic contact dermatitis   . Vertigo   . Arthritis   . Osteopenia   . Hepatitis     over 50 years ago  . Long-term use of high-risk medication     Past Surgical History  Procedure Laterality Date  . Tonsillectomy    . Appendectomy    . Hip orif w/ capsulotomy Left 10/22/2013    [name]   . Fracture surgery  2015    left hip fracture  . Dilatation & curettage/hysteroscopy with myosure N/A 06/26/2015    Procedure: DILATATION & CURETTAGE/HYSTEROSCOPY WITH MYOSURE;  Surgeon: Arvella Nigh, MD;  Location: [facility];  Service: Gynecology;  Laterality: N/A;    Allergies  Allergen Reactions  . Latex Rash  . Ciprofloxacin Other (See Comments)    Doesn't work  . Doxycycline Other (See Comments)    unknown  . Levaquin [Levofloxacin In D5w] Other (See Comments)    unknown  . Lopid [Gemfibrozil]   .  Macrobid [employer] Macro] Other (See Comments)    Doesn't work  . Sulfa Antibiotics Other (See Comments)    unknown      Medication List       This list is accurate as of: 11/25/15 11:59 PM.  Always use your most recent med list.               CALCIUM 600+D3 600-800 MG-UNIT Tabs  Generic drug:  Calcium Carb-Cholecalciferol  Take 1 tablet by mouth daily. Take one tablet daily     D3 HIGH POTENCY 1000 units capsule  Generic drug:  Cholecalciferol  Take 1,000 Units by mouth daily. Take one tablet daily     ELIDEL 1 % cream  Generic drug:  pimecrolimus  Apply 1 application topically 2 (two) times daily as needed (skin rash).     Fish Oil 1200 MG Caps  Take 2 capsules by mouth daily. Take one tablet twice daily     fluocinolone 0.01 % cream  Commonly known as:  VANOS    Apply 1 application topically 2 (two) times daily as needed (skin rash).     Magnesium 400 MG Caps  Take 1 tablet by mouth daily. Take one tablet daily     polyethylene glycol packet  Commonly known as:  MIRALAX / GLYCOLAX  Take 17 g by mouth daily as needed for mild constipation. Use as needed     raloxifene 60 MG tablet  Commonly known as:  EVISTA  Take 1 tablet (60 mg total) by mouth daily. Take one table daily     tacrolimus 0.1 % ointment  Commonly known as:  PROTOPIC  Apply 1 application topically 2 (two) times daily as needed (skin rash).     trimethoprim 100 MG tablet  Commonly known as:  TRIMPEX  Take 100 mg by mouth once.     TYLENOL ARTHRITIS PAIN 650 MG CR tablet  Generic drug:  acetaminophen  Take 650 mg by mouth every 8 (eight) hours as needed for pain.     acetaminophen 325 MG tablet  Commonly known as:  TYLENOL  Take 325 mg by mouth every 6 (six) hours as needed for headache.     vitamin B-12 1000 MCG tablet  Commonly known as:  CYANOCOBALAMIN  Take 1,000 mcg by mouth every other day.        Review of Systems:  Review of Systems  Constitutional: Positive for appetite change. Negative for fever, chills, activity change and unexpected weight change.       Weight slightly up  HENT: Positive for hearing loss. Negative for congestion, ear pain and sinus pressure.   Eyes: Positive for pain and itching. Negative for visual disturbance.       Glasses  Respiratory: Negative for shortness of breath.   Cardiovascular: Negative for chest pain, palpitations and leg swelling.  Gastrointestinal: Negative for nausea, abdominal pain, diarrhea and constipation.       Appetite improved  Genitourinary: Positive for dysuria. Negative for urgency, frequency and difficulty urinating.  Musculoskeletal: Positive for back pain, arthralgias and gait problem.  Skin: Positive for pallor.  Neurological: Positive for tremors. Negative for dizziness and headaches.   Psychiatric/Behavioral: Positive for confusion. Negative for agitation. The patient is nervous/anxious.     Health Maintenance  Topic Date Due  . DEXA SCAN  07/20/1996  . ZOSTAVAX  08/29/2018 (Originally 07/21/1991)  . TETANUS/TDAP  08/29/2018 (Originally 07/20/1950)  . PNA vac Low Risk Adult (1 of 2 - PCV13) 08/29/2018 (Originally  07/20/1996)  . INFLUENZA VACCINE  03/29/2016    Physical Exam: Filed Vitals:   11/25/15 1614  BP: 110/72  Pulse: 58  Temp: 97.8 F (36.6 C)  TempSrc: Oral  Height: 5' (1.524 m)  Weight: 107 lb (48.535 kg)  SpO2: 92%   Body mass index is 20.9 kg/(m^2). Physical Exam  Constitutional: She appears well-developed and well-nourished. No distress.  Thin white female walking with a cane, is unsteady and carries the cane instead of using it despite attempts by therapy  HENT:  Head: Normocephalic and atraumatic.  Eyes:  glasses  Neck: Neck supple.  Cardiovascular: Normal rate, regular rhythm, normal heart sounds and intact distal pulses.   Pulmonary/Chest: Effort normal and breath sounds normal. No respiratory distress.  Abdominal: Soft. Bowel sounds are normal. She exhibits no distension. There is no tenderness.  Musculoskeletal: She exhibits tenderness.  Of left hip/groin, lower back  Neurological: She is alert. No cranial nerve deficit. Coordination abnormal.  Oriented to person, place, not exact date; essential tremor of head, some of hands as well  Skin: Skin is warm and dry. There is pallor.  Psychiatric:  Anxious, repeats stories, fixates on same problems each visit especially her bladder    Labs reviewed: Basic Metabolic Panel:  Recent Labs  11/20/15  NA 138  K 4.3  BUN 33*  CREATININE 1.5*   Liver Function Tests:  Recent Labs  11/20/15  AST 22  ALT 8  ALKPHOS 39   No results for input(s): LIPASE, AMYLASE in the last 8760 hours. No results for input(s): AMMONIA in the last 8760 hours. CBC:  Recent Labs  06/24/15 0815  11/20/15  WBC 6.6 4.2  HGB 12.8 13.5  HCT 40.1 39  MCV 95.9  --   PLT 171 176   Reviewed notes from Urology since last visit  Assessment/Plan 1. Senile osteoporosis - cont on ca with D and vitamin D, but her daughter is going to call in with current doses and we can advise proper doses to take b/c I suspect she is overdoing it with her D3 being 75--over 40 is goal - raloxifene (EVISTA) 60 MG tablet; Take 1 tablet (60 mg total) by mouth daily. Take one table daily  Dispense: 90 tablet; Refill: 3  2. Overactive bladder -cont to follow with urology -keeps having positive urine cultures and same symptoms--likely she is colonized -she is on chronic trimethoprim now also -is off myrbetriq  3. Dementia, without behavioral disturbance -she is living independently in a home on [address], still drives (in fact, her son in law bought her a new used car after hers was going to cost more to repair than to replace) -due to my concerns about her medication mgt, I asked her daughter to check her pills and vitamins to make sure she's not taking multiple of the same thing--she had pulled several bottles out of her purse during the appt -we again discussed the idea of assisted living, but pt says she will absolutely not move there -she's on the wait list for [place] apt  -also suggested that family ride with pt to see how she does  4. Other polyneuropathy ([place]) -continues on b12 supplement (again check dose)  5. B12 deficiency -contributes to #4, so cont b12 but no more than 1026mcg daily  6. Benign essential tremor -had previous diagnosis in [place] -cont to monitor--seems this is getting worse to me -bp would not tolerate treatment  7. Recurrent UTI -following with urology -seems she has an  obsession with this--doubt she is hydrating well enough which is not helping  Labs/tests ordered:  Just had labs this time Next appt:  02/24/2016 for med mgt  Litzy Dicker L. Nayali Talerico, D.O. [facility] [address], [place] [address] Cell Phone (Mon-Fri 8am-5pm):  [phone] On Call:  [phone] & follow prompts after 5pm & weekends Office Phone:  [phone] Office Fax:  [phone]

## 2015-11-26 ENCOUNTER — Telehealth: Payer: Self-pay

## 2015-11-26 NOTE — Telephone Encounter (Signed)
Called patients daughter back,I informed her as to what the doctor wanted and she agreed and had no further questions.

## 2015-11-26 NOTE — Telephone Encounter (Signed)
Doses I want patient to take--please inform her daughter:   Vitamin D3 should be 2000 units daily.   B12 should be 1000 units daily. Continue Calcium with D3 600/800 daily also.

## 2015-11-26 NOTE — Telephone Encounter (Signed)
Patients daughter called to check on what the dosage for her mother's vitamin D3,B12and calcium are, and their correct Mg. When I checked her mother's medication list I found out that patient has been taking D3 5000 unit per day and B12 3000 units per day and calcium with D3 600-800. Patients daughter said she will replace the vitamins with the correct ones that you prescribed if this is what you want her to do, or did you want her to stop them all together

## 2015-12-21 DIAGNOSIS — R351 Nocturia: Secondary | ICD-10-CM | POA: Diagnosis not present

## 2015-12-21 DIAGNOSIS — Z0000 Encounter for general adult medical examination without abnormal findings: Secondary | ICD-10-CM | POA: Diagnosis not present

## 2015-12-21 DIAGNOSIS — N390 Urinary tract infection, site not specified: Secondary | ICD-10-CM | POA: Diagnosis not present

## 2015-12-21 DIAGNOSIS — N3020 Other chronic cystitis without hematuria: Secondary | ICD-10-CM | POA: Diagnosis not present

## 2015-12-21 DIAGNOSIS — R3989 Other symptoms and signs involving the genitourinary system: Secondary | ICD-10-CM | POA: Diagnosis not present

## 2015-12-29 DIAGNOSIS — N3020 Other chronic cystitis without hematuria: Secondary | ICD-10-CM | POA: Diagnosis not present

## 2015-12-29 DIAGNOSIS — R350 Frequency of micturition: Secondary | ICD-10-CM | POA: Diagnosis not present

## 2015-12-29 DIAGNOSIS — Z0000 Encounter for general adult medical examination without abnormal findings: Secondary | ICD-10-CM | POA: Diagnosis not present

## 2016-01-25 DIAGNOSIS — Y9301 Activity, walking, marching and hiking: Secondary | ICD-10-CM | POA: Diagnosis not present

## 2016-01-25 DIAGNOSIS — Z87440 Personal history of urinary (tract) infections: Secondary | ICD-10-CM | POA: Diagnosis not present

## 2016-01-25 DIAGNOSIS — Z79899 Other long term (current) drug therapy: Secondary | ICD-10-CM | POA: Diagnosis not present

## 2016-01-25 DIAGNOSIS — S72009A Fracture of unspecified part of neck of unspecified femur, initial encounter for closed fracture: Secondary | ICD-10-CM

## 2016-01-25 DIAGNOSIS — R2689 Other abnormalities of gait and mobility: Secondary | ICD-10-CM | POA: Diagnosis not present

## 2016-01-25 DIAGNOSIS — S32509A Unspecified fracture of unspecified pubis, initial encounter for closed fracture: Secondary | ICD-10-CM | POA: Insufficient documentation

## 2016-01-25 DIAGNOSIS — W010XXA Fall on same level from slipping, tripping and stumbling without subsequent striking against object, initial encounter: Secondary | ICD-10-CM | POA: Insufficient documentation

## 2016-01-25 DIAGNOSIS — S72091A Other fracture of head and neck of right femur, initial encounter for closed fracture: Secondary | ICD-10-CM | POA: Diagnosis not present

## 2016-01-25 DIAGNOSIS — S72001A Fracture of unspecified part of neck of right femur, initial encounter for closed fracture: Secondary | ICD-10-CM | POA: Diagnosis not present

## 2016-01-25 DIAGNOSIS — S32591A Other specified fracture of right pubis, initial encounter for closed fracture: Secondary | ICD-10-CM | POA: Insufficient documentation

## 2016-01-25 DIAGNOSIS — M810 Age-related osteoporosis without current pathological fracture: Secondary | ICD-10-CM | POA: Diagnosis not present

## 2016-01-25 DIAGNOSIS — D72829 Elevated white blood cell count, unspecified: Secondary | ICD-10-CM | POA: Diagnosis present

## 2016-01-25 DIAGNOSIS — Z91040 Latex allergy status: Secondary | ICD-10-CM | POA: Diagnosis not present

## 2016-01-25 DIAGNOSIS — S72002D Fracture of unspecified part of neck of left femur, subsequent encounter for closed fracture with routine healing: Secondary | ICD-10-CM | POA: Diagnosis not present

## 2016-01-25 DIAGNOSIS — S32591S Other specified fracture of right pubis, sequela: Secondary | ICD-10-CM | POA: Diagnosis not present

## 2016-01-25 DIAGNOSIS — M21051 Valgus deformity, not elsewhere classified, right hip: Secondary | ICD-10-CM | POA: Diagnosis present

## 2016-01-25 DIAGNOSIS — S0990XA Unspecified injury of head, initial encounter: Secondary | ICD-10-CM | POA: Diagnosis not present

## 2016-01-25 DIAGNOSIS — N3281 Overactive bladder: Secondary | ICD-10-CM | POA: Diagnosis not present

## 2016-01-25 DIAGNOSIS — R531 Weakness: Secondary | ICD-10-CM | POA: Diagnosis not present

## 2016-01-25 DIAGNOSIS — Z91018 Allergy to other foods: Secondary | ICD-10-CM | POA: Diagnosis not present

## 2016-01-25 HISTORY — DX: Fracture of unspecified part of neck of unspecified femur, initial encounter for closed fracture: S72.009A

## 2016-01-26 DIAGNOSIS — S32591A Other specified fracture of right pubis, initial encounter for closed fracture: Secondary | ICD-10-CM

## 2016-01-26 DIAGNOSIS — S72091A Other fracture of head and neck of right femur, initial encounter for closed fracture: Secondary | ICD-10-CM | POA: Diagnosis not present

## 2016-01-26 DIAGNOSIS — S7291XA Unspecified fracture of right femur, initial encounter for closed fracture: Secondary | ICD-10-CM

## 2016-01-26 HISTORY — PX: OTHER SURGICAL HISTORY: SHX169

## 2016-01-26 HISTORY — DX: Other specified fracture of right pubis, initial encounter for closed fracture: S32.591A

## 2016-01-26 HISTORY — DX: Unspecified fracture of right femur, initial encounter for closed fracture: S72.91XA

## 2016-01-27 DIAGNOSIS — Z8781 Personal history of (healed) traumatic fracture: Secondary | ICD-10-CM | POA: Insufficient documentation

## 2016-01-27 DIAGNOSIS — Z9889 Other specified postprocedural states: Secondary | ICD-10-CM

## 2016-01-28 LAB — BASIC METABOLIC PANEL
BUN: 26 mg/dL — AB (ref 4–21)
Creatinine: 1.2 mg/dL — AB (ref 0.5–1.1)
Glucose: 99 mg/dL
POTASSIUM: 4.4 mmol/L (ref 3.4–5.3)
Sodium: 140 mmol/L (ref 137–147)

## 2016-01-28 LAB — POCT INR: INR: 0.9 (ref 0.9–1.1)

## 2016-01-28 LAB — CBC AND DIFFERENTIAL
HCT: 37 % (ref 36–46)
HEMOGLOBIN: 11.8 g/dL — AB (ref 12.0–16.0)
Platelets: 134 10*3/uL — AB (ref 150–399)
WBC: 6.7 10*3/mL

## 2016-01-29 DIAGNOSIS — G6289 Other specified polyneuropathies: Secondary | ICD-10-CM | POA: Diagnosis not present

## 2016-01-29 DIAGNOSIS — R1031 Right lower quadrant pain: Secondary | ICD-10-CM | POA: Diagnosis not present

## 2016-01-29 DIAGNOSIS — M25551 Pain in right hip: Secondary | ICD-10-CM | POA: Diagnosis not present

## 2016-01-29 DIAGNOSIS — R2689 Other abnormalities of gait and mobility: Secondary | ICD-10-CM | POA: Diagnosis not present

## 2016-01-29 DIAGNOSIS — R488 Other symbolic dysfunctions: Secondary | ICD-10-CM | POA: Diagnosis not present

## 2016-01-29 DIAGNOSIS — Z7389 Other problems related to life management difficulty: Secondary | ICD-10-CM | POA: Diagnosis not present

## 2016-01-29 DIAGNOSIS — M6281 Muscle weakness (generalized): Secondary | ICD-10-CM | POA: Diagnosis not present

## 2016-01-29 DIAGNOSIS — Z9181 History of falling: Secondary | ICD-10-CM | POA: Diagnosis not present

## 2016-01-29 DIAGNOSIS — M84451S Pathological fracture, right femur, sequela: Secondary | ICD-10-CM | POA: Diagnosis not present

## 2016-01-29 DIAGNOSIS — R1312 Dysphagia, oropharyngeal phase: Secondary | ICD-10-CM | POA: Diagnosis not present

## 2016-01-29 DIAGNOSIS — Z4789 Encounter for other orthopedic aftercare: Secondary | ICD-10-CM | POA: Diagnosis not present

## 2016-01-29 DIAGNOSIS — F0390 Unspecified dementia without behavioral disturbance: Secondary | ICD-10-CM | POA: Diagnosis not present

## 2016-01-29 DIAGNOSIS — R102 Pelvic and perineal pain: Secondary | ICD-10-CM | POA: Diagnosis not present

## 2016-01-29 DIAGNOSIS — R278 Other lack of coordination: Secondary | ICD-10-CM | POA: Diagnosis not present

## 2016-02-01 DIAGNOSIS — Z7389 Other problems related to life management difficulty: Secondary | ICD-10-CM | POA: Diagnosis not present

## 2016-02-01 DIAGNOSIS — R2689 Other abnormalities of gait and mobility: Secondary | ICD-10-CM | POA: Diagnosis not present

## 2016-02-01 DIAGNOSIS — M6281 Muscle weakness (generalized): Secondary | ICD-10-CM | POA: Diagnosis not present

## 2016-02-01 DIAGNOSIS — R278 Other lack of coordination: Secondary | ICD-10-CM | POA: Diagnosis not present

## 2016-02-01 DIAGNOSIS — R488 Other symbolic dysfunctions: Secondary | ICD-10-CM | POA: Diagnosis not present

## 2016-02-01 DIAGNOSIS — D72829 Elevated white blood cell count, unspecified: Secondary | ICD-10-CM | POA: Diagnosis not present

## 2016-02-01 DIAGNOSIS — R1031 Right lower quadrant pain: Secondary | ICD-10-CM | POA: Diagnosis not present

## 2016-02-01 LAB — CBC AND DIFFERENTIAL
HCT: 42 % (ref 36–46)
Hemoglobin: 12.5 g/dL (ref 12.0–16.0)
Platelets: 184 10*3/uL (ref 150–399)
WBC: 5.3 10^3/mL

## 2016-02-02 ENCOUNTER — Encounter: Payer: Self-pay | Admitting: Internal Medicine

## 2016-02-02 ENCOUNTER — Non-Acute Institutional Stay (SKILLED_NURSING_FACILITY): Payer: Medicare Other | Admitting: Internal Medicine

## 2016-02-02 DIAGNOSIS — K5909 Other constipation: Secondary | ICD-10-CM

## 2016-02-02 DIAGNOSIS — G250 Essential tremor: Secondary | ICD-10-CM | POA: Diagnosis not present

## 2016-02-02 DIAGNOSIS — Z9889 Other specified postprocedural states: Secondary | ICD-10-CM

## 2016-02-02 DIAGNOSIS — R2689 Other abnormalities of gait and mobility: Secondary | ICD-10-CM | POA: Diagnosis not present

## 2016-02-02 DIAGNOSIS — N3281 Overactive bladder: Secondary | ICD-10-CM

## 2016-02-02 DIAGNOSIS — M810 Age-related osteoporosis without current pathological fracture: Secondary | ICD-10-CM

## 2016-02-02 DIAGNOSIS — G6289 Other specified polyneuropathies: Secondary | ICD-10-CM

## 2016-02-02 DIAGNOSIS — K5900 Constipation, unspecified: Secondary | ICD-10-CM

## 2016-02-02 DIAGNOSIS — R278 Other lack of coordination: Secondary | ICD-10-CM | POA: Diagnosis not present

## 2016-02-02 DIAGNOSIS — R1031 Right lower quadrant pain: Secondary | ICD-10-CM | POA: Diagnosis not present

## 2016-02-02 DIAGNOSIS — S32591A Other specified fracture of right pubis, initial encounter for closed fracture: Secondary | ICD-10-CM

## 2016-02-02 DIAGNOSIS — Z7389 Other problems related to life management difficulty: Secondary | ICD-10-CM | POA: Diagnosis not present

## 2016-02-02 DIAGNOSIS — F0390 Unspecified dementia without behavioral disturbance: Secondary | ICD-10-CM | POA: Diagnosis not present

## 2016-02-02 DIAGNOSIS — W010XXA Fall on same level from slipping, tripping and stumbling without subsequent striking against object, initial encounter: Secondary | ICD-10-CM

## 2016-02-02 DIAGNOSIS — S32501A Unspecified fracture of right pubis, initial encounter for closed fracture: Secondary | ICD-10-CM

## 2016-02-02 DIAGNOSIS — M6281 Muscle weakness (generalized): Secondary | ICD-10-CM | POA: Diagnosis not present

## 2016-02-02 DIAGNOSIS — R488 Other symbolic dysfunctions: Secondary | ICD-10-CM | POA: Diagnosis not present

## 2016-02-02 DIAGNOSIS — S72001A Fracture of unspecified part of neck of right femur, initial encounter for closed fracture: Secondary | ICD-10-CM | POA: Diagnosis not present

## 2016-02-02 NOTE — Progress Notes (Signed)
Patient ID: HAYVN DECHERT, female   DOB: 12-25-1928, 82 y.o.   MRN: [id number]  Provider:  Rexene Edison. Mariea Clonts, D.O., C.M.D. Location:  [facility] Room Number: [place] of Service:  SNF (31)  PCP: Hollace Kinnier, DO Patient Care Team: Gayland Curry, DO as PCP - General (Geriatric Medicine) Bjorn Loser, MD as Consulting Physician (Urology)  Extended Emergency Contact Information Primary Emergency Contact: [facility] Address: [address]          [place], [place] [address] Johnnette Litter of [place] Phone: [phone] Work Phone: [phone] Mobile Phone: [phone] Relation: Daughter Secondary Emergency Contact: [facility] Address: [address]          [place], [place] [address] Johnnette Litter of [place] Phone: [phone] Mobile Phone: [phone] Relation: Other  Code Status: DNR Goals of Care: Advanced Directive information Advanced Directives 02/02/2016  Does patient have an advance directive? Yes  Type of Advance Directive Out of facility DNR (pink MOST or yellow form);[place];Living will  Copy of advanced directive(s) in chart? Yes  Pre-existing out of facility DNR order (yellow form or pink MOST form) Yellow form placed in chart (order not valid for inpatient use)   Chief Complaint  Patient presents with  . New Admit To SNF    admit to rehab right pubic fx, right femur fx    HPI: Patient is a 82 y.o. female seen today for admission to [facility] rehab s/p right pubic fracture and right femur fx s/p fall in a local restaurant.  She has continued to want to use her cane and her daughter's arm and says her daughter had to let go of her for a short time and she went sailing across the room.  This was before their meal.  She hit her right eyebrow and has some bruising on her right arm also.  She sustained the fractures above.  She underwent   Past Medical History  Diagnosis Date  .  Cystitis, interstitial   . Tremors of nervous system   . Allergic contact dermatitis   . Vertigo   . Arthritis   . Osteopenia   . Hepatitis     over 50 years ago  . Long-term use of high-risk medication    Past Surgical History  Procedure Laterality Date  . Tonsillectomy    . Appendectomy    . Hip orif w/ capsulotomy Left 10/22/2013    [name]   . Fracture surgery  2015    left hip fracture  . Dilatation & curettage/hysteroscopy with myosure N/A 06/26/2015    Procedure: DILATATION & CURETTAGE/HYSTEROSCOPY WITH MYOSURE;  Surgeon: Arvella Nigh, MD;  Location: [facility];  Service: Gynecology;  Laterality: N/A;    reports that she has never smoked. She has never used smokeless tobacco. She reports that she does not drink alcohol or use illicit drugs. Social History   Social History  . Marital Status: Widowed    Spouse Name: N/A  . Number of Children: N/A  . Years of Education: N/A   Occupational History  . stay at home mom    Social History Main Topics  . Smoking status: Never Smoker   . Smokeless tobacco: Never Used  . Alcohol Use: No  . Drug Use: No  . Sexual Activity: No   Other Topics Concern  . Not on file   Social History Narrative   Lives at [facility] moved in 04/02/15   Widow   Never smoked  Alcohol none   Exercise walking, leg exercise in bed twice daily    Walks  Cane   POA, Living Will       Functional Status Survey:    Family History  Problem Relation Age of Onset  . Dementia Mother   . Cancer Father     pancreatic  . Hepatitis Brother     Health Maintenance  Topic Date Due  . DEXA SCAN  07/20/1996  . ZOSTAVAX  08/29/2018 (Originally 07/21/1991)  . TETANUS/TDAP  08/29/2018 (Originally 07/20/1950)  . PNA vac Low Risk Adult (1 of 2 - PCV13) 08/29/2018 (Originally 07/20/1996)  . INFLUENZA VACCINE  03/29/2016    Allergies  Allergen Reactions  . Ham Other (See Comments)  . Latex Rash  . Other Other (See Comments)    Pt reports was  seen by neurologist for tremors and was allergic to all medications prescribed for tremors, but does not know names or reaction. Patient reports being allergic to polyester. Does not know what reaction would be. States she was seen by a doctor who tested her for multiple allergies and she had positive results.  . Ciprofloxacin Other (See Comments)    Doesn't work  . Doxycycline Other (See Comments)    unknown  . Levaquin [Levofloxacin In D5w] Other (See Comments)    unknown  . Lopid [Gemfibrozil]   . Macrobid [employer] Macro] Other (See Comments)    Doesn't work  . Sulfa Antibiotics Other (See Comments)    unknown      Medication List       This list is accurate as of: 02/02/16 10:56 AM.  Always use your most recent med list.               CALCIUM 600+D3 600-800 MG-UNIT Tabs  Generic drug:  Calcium Carb-Cholecalciferol  Take 1 tablet by mouth daily. Take one tablet daily     cephALEXin 250 MG capsule  Commonly known as:  KEFLEX  Take 250 mg by mouth daily before lunch.     D3 HIGH POTENCY 1000 units capsule  Generic drug:  Cholecalciferol  Take 1,000 Units by mouth daily. Take one tablet daily     docusate sodium 100 MG capsule  Commonly known as:  COLACE  Take 200 mg by mouth daily.     ELIDEL 1 % cream  Generic drug:  pimecrolimus  Apply 1 application topically 2 (two) times daily as needed (skin rash).     Fish Oil 1200 MG Caps  Take 2 capsules by mouth daily. Take one tablet twice daily     fluocinolone 0.01 % cream  Commonly known as:  VANOS  Apply 1 application topically 2 (two) times daily as needed (skin rash).     HYDROcodone-acetaminophen 5-325 MG tablet  Commonly known as:  NORCO/VICODIN  Take 1 tablet by mouth 2 (two) times daily as needed for moderate pain (do not exceed 3 gm APAP in 24 hours including tylenol).     Magnesium 400 MG Caps  Take 1 tablet by mouth daily. Take one tablet daily     MYRBETRIQ 50 MG Tb24 tablet  Generic  drug:  mirabegron ER  Take 50 mg by mouth daily.     raloxifene 60 MG tablet  Commonly known as:  EVISTA  Take 1 tablet (60 mg total) by mouth daily. Take one table daily     vitamin B-12 1000 MCG tablet  Commonly known as:  CYANOCOBALAMIN  Take 1,000 mcg by mouth  every other day.        Review of Systems  Constitutional: Negative for fever, chills, appetite change and unexpected weight change.  HENT: Positive for hearing loss. Negative for congestion and sore throat.   Eyes: Negative for discharge and visual disturbance.  Respiratory: Negative for apnea, chest tightness and shortness of breath.   Cardiovascular: Negative for chest pain, palpitations and leg swelling.  Gastrointestinal: Negative for nausea, vomiting and abdominal pain.  Genitourinary: Positive for dysuria and urgency.       Chronic urinary complaints and colonization  Musculoskeletal: Positive for arthralgias and gait problem. Negative for myalgias.       Left hip, left shoulder, now right groin with walking only  Neurological: Positive for weakness. Negative for dizziness.  Psychiatric/Behavioral: Positive for confusion. Negative for behavioral problems. The patient is nervous/anxious.     Filed Vitals:   02/02/16 1024  BP: 111/75  Pulse: 79  Temp: 98.4 F (36.9 C)  TempSrc: Oral  Resp: 20  Weight: 105 lb (47.628 kg)  SpO2: 94%   Body mass index is 20.51 kg/(m^2). Physical Exam  Constitutional: No distress.  Thin white female  HENT:  Head: Normocephalic and atraumatic.  Right Ear: External ear normal.  Left Ear: External ear normal.  Mouth/Throat: Oropharynx is clear and moist.  Eyes: Conjunctivae are normal. Pupils are equal, round, and reactive to light.  Neck: Normal range of motion. Neck supple. No thyromegaly present.  Cardiovascular: Normal rate, regular rhythm, normal heart sounds and intact distal pulses.   Pulmonary/Chest: Effort normal and breath sounds normal.  Abdominal: Soft. Bowel  sounds are normal. She exhibits no distension and no mass. There is no tenderness. There is no rebound and no guarding.  Musculoskeletal: Normal range of motion. She exhibits no edema or tenderness.  Neurological: She is alert.  Oriented to person and place, not precise time, short term memory loss during conversation; essential tremor of head  Skin: Skin is warm and dry.  Incision site looks great without erythema, warmth, drainage, even much ecchymoses at this point  Psychiatric:  A bit anxious    Labs reviewed: Basic Metabolic Panel:  Recent Labs  11/20/15 01/28/16  NA 138 140  K 4.3 4.4  BUN 33* 26*  CREATININE 1.5* 1.2*   Liver Function Tests:  Recent Labs  11/20/15  AST 22  ALT 8  ALKPHOS 39   No results for input(s): LIPASE, AMYLASE in the last 8760 hours. No results for input(s): AMMONIA in the last 8760 hours. CBC:  Recent Labs  06/24/15 0815 11/20/15 01/28/16 02/01/16 0500  WBC 6.6 4.2 6.7 5.3  HGB 12.8 13.5 11.8* 12.5  HCT 40.1 39 37 42  MCV 95.9  --   --   --   PLT 171 176 134* 184   Labs: from careeverywhere [facility] Results for orders placed or performed during the hospital encounter of 01/25/16 (from the past 24 hour(s))  Basic Metabolic Panel  Collection Time: 01/28/16 5:37 AM  Result Value Ref Range  Na 140 136 - 146 mmol/L  Potassium 4.4 3.7 - 5.4 mmol/L  Cl 104 97 - 108 mmol/L  CO2 27 20 - 32 mmol/L  Glucose 99 65 - 99 mg/dL  BUN 26 8 - 27 mg/dL  Creatinine 1.20 (H) 0.57 - 1.00 mg/dL  Ca 9.3 8.6 - 10.2 mg/dL  BUN/CREAT RATIO 21.7 11.0 - 26.0  GFR AFRICAN AMERICAN 48 mL/min/1.57m2  GFR Non African American 42 mL/min/1.35m2  AGAP 9 7 - 16  mmol/L  CBC  Collection Time: 01/28/16 5:37 AM  Result Value Ref Range  WBC 6.7 3.7 - 11.0 thou/mcL  RBC 3.83 (L) 4.01 - 4.90 million/mcL  HGB 11.8 (L) 12.2 - 14.9 gm/dL  HCT 36.9 35.8 - 47.9 %  MCV 96 82 - 98 fL  MCH 30.8 27.0 - 33.0 pg  MCHC 32.0 31.0 - 37.0 gm/dL  Plt Ct 134 (L) 150 - 400  thou/mcL  RDW SD 44.6 38.1 - 49.1 fL  RDW CV 13.3 11.8 - 14.9 %  MPV 10.2 8.9 - 11.2 fL  Protime-INR  Collection Time: 01/28/16 5:37 AM  Result Value Ref Range  PT 10.2 9.3 - 12.3 second(s)  INR 0.94 See Therapeutic ranges   Imaging and Procedures obtained prior to SNF admission: See hpi, careeverywhere, [facility]  Assessment/Plan 1. Fall on same level from slipping, tripping and stumbling without subsequent striking against object, initial encounter -has had poor balance and has had PT who recommended a walker at all times, but she had resorted back to her cane against their and my advice, sustained a fall at a restaurant when she was holding on to her daughter and her daughter had to let go   2. Closed fracture of single pubic ramus of pelvis, right, initial encounter ([place]) -see #3  3. Closed fracture of neck of right femur, initial encounter ([place]) -and right pubic ramus -is s/p repair and doing very well, not using hydrocodone, only tylenol and only has pain with some weightbearing, using walker  4. History of repair of hip joint -has chronic left hip pain from a prior hip repair and now had right hip broken and repaired -f/u with Dr. Jaynee Eagles ortho at [facility] on 6/14 2:30pm  5. Senile osteoporosis -continues on evista, ca with D and additional D; just had a hip fx on this treatment, so will discuss at a f/u possible d/c evista and try her on fosamax for a couple of years  6. Dementia, without behavioral disturbance -is mild to moderate--has been sort of managing in [place], but needs her pillboxes filled and has difficulty with med mgt, keeping track of appts, and overall short term memory loss -[place] has been recommended, but she's been refusing -she also still was driving some until this recent fall and I'd advise against any driving  7. Benign essential tremor -no on any meds for this (is already falling and unsteady)  8. Chronic constipation -due to inadequate hydration and ca  supplement -encouraged above, prunes, fiber, and does use colace stool softener (also has hemorrhoids)  9. Other polyneuropathy ([place]) -continue b12 daily supplement  10. Overactive bladder -back on myrbetriq through urology so cont this at 50mg  and monitor  Family/ staff Communication: discussed with rehab nurse, manager  Labs/tests ordered:  No new today

## 2016-02-03 DIAGNOSIS — R1031 Right lower quadrant pain: Secondary | ICD-10-CM | POA: Diagnosis not present

## 2016-02-03 DIAGNOSIS — R488 Other symbolic dysfunctions: Secondary | ICD-10-CM | POA: Diagnosis not present

## 2016-02-03 DIAGNOSIS — Z7389 Other problems related to life management difficulty: Secondary | ICD-10-CM | POA: Diagnosis not present

## 2016-02-03 DIAGNOSIS — M6281 Muscle weakness (generalized): Secondary | ICD-10-CM | POA: Diagnosis not present

## 2016-02-03 DIAGNOSIS — R2689 Other abnormalities of gait and mobility: Secondary | ICD-10-CM | POA: Diagnosis not present

## 2016-02-03 DIAGNOSIS — R278 Other lack of coordination: Secondary | ICD-10-CM | POA: Diagnosis not present

## 2016-02-04 DIAGNOSIS — R2689 Other abnormalities of gait and mobility: Secondary | ICD-10-CM | POA: Diagnosis not present

## 2016-02-04 DIAGNOSIS — Z7389 Other problems related to life management difficulty: Secondary | ICD-10-CM | POA: Diagnosis not present

## 2016-02-04 DIAGNOSIS — R278 Other lack of coordination: Secondary | ICD-10-CM | POA: Diagnosis not present

## 2016-02-04 DIAGNOSIS — M6281 Muscle weakness (generalized): Secondary | ICD-10-CM | POA: Diagnosis not present

## 2016-02-04 DIAGNOSIS — R1031 Right lower quadrant pain: Secondary | ICD-10-CM | POA: Diagnosis not present

## 2016-02-04 DIAGNOSIS — R488 Other symbolic dysfunctions: Secondary | ICD-10-CM | POA: Diagnosis not present

## 2016-02-05 ENCOUNTER — Encounter: Payer: Self-pay | Admitting: Internal Medicine

## 2016-02-05 DIAGNOSIS — R488 Other symbolic dysfunctions: Secondary | ICD-10-CM | POA: Diagnosis not present

## 2016-02-05 DIAGNOSIS — Z7389 Other problems related to life management difficulty: Secondary | ICD-10-CM | POA: Diagnosis not present

## 2016-02-05 DIAGNOSIS — R278 Other lack of coordination: Secondary | ICD-10-CM | POA: Diagnosis not present

## 2016-02-05 DIAGNOSIS — R1031 Right lower quadrant pain: Secondary | ICD-10-CM | POA: Diagnosis not present

## 2016-02-05 DIAGNOSIS — M6281 Muscle weakness (generalized): Secondary | ICD-10-CM | POA: Diagnosis not present

## 2016-02-05 DIAGNOSIS — R2689 Other abnormalities of gait and mobility: Secondary | ICD-10-CM | POA: Diagnosis not present

## 2016-02-06 DIAGNOSIS — Z7389 Other problems related to life management difficulty: Secondary | ICD-10-CM | POA: Diagnosis not present

## 2016-02-06 DIAGNOSIS — R2689 Other abnormalities of gait and mobility: Secondary | ICD-10-CM | POA: Diagnosis not present

## 2016-02-06 DIAGNOSIS — M6281 Muscle weakness (generalized): Secondary | ICD-10-CM | POA: Diagnosis not present

## 2016-02-06 DIAGNOSIS — R1031 Right lower quadrant pain: Secondary | ICD-10-CM | POA: Diagnosis not present

## 2016-02-06 DIAGNOSIS — R488 Other symbolic dysfunctions: Secondary | ICD-10-CM | POA: Diagnosis not present

## 2016-02-06 DIAGNOSIS — R278 Other lack of coordination: Secondary | ICD-10-CM | POA: Diagnosis not present

## 2016-02-07 DIAGNOSIS — R278 Other lack of coordination: Secondary | ICD-10-CM | POA: Diagnosis not present

## 2016-02-07 DIAGNOSIS — R488 Other symbolic dysfunctions: Secondary | ICD-10-CM | POA: Diagnosis not present

## 2016-02-07 DIAGNOSIS — M6281 Muscle weakness (generalized): Secondary | ICD-10-CM | POA: Diagnosis not present

## 2016-02-07 DIAGNOSIS — R1031 Right lower quadrant pain: Secondary | ICD-10-CM | POA: Diagnosis not present

## 2016-02-07 DIAGNOSIS — Z7389 Other problems related to life management difficulty: Secondary | ICD-10-CM | POA: Diagnosis not present

## 2016-02-07 DIAGNOSIS — R2689 Other abnormalities of gait and mobility: Secondary | ICD-10-CM | POA: Diagnosis not present

## 2016-02-08 DIAGNOSIS — R278 Other lack of coordination: Secondary | ICD-10-CM | POA: Diagnosis not present

## 2016-02-08 DIAGNOSIS — R488 Other symbolic dysfunctions: Secondary | ICD-10-CM | POA: Diagnosis not present

## 2016-02-08 DIAGNOSIS — Z7389 Other problems related to life management difficulty: Secondary | ICD-10-CM | POA: Diagnosis not present

## 2016-02-08 DIAGNOSIS — M6281 Muscle weakness (generalized): Secondary | ICD-10-CM | POA: Diagnosis not present

## 2016-02-08 DIAGNOSIS — R2689 Other abnormalities of gait and mobility: Secondary | ICD-10-CM | POA: Diagnosis not present

## 2016-02-08 DIAGNOSIS — R1031 Right lower quadrant pain: Secondary | ICD-10-CM | POA: Diagnosis not present

## 2016-02-09 DIAGNOSIS — R278 Other lack of coordination: Secondary | ICD-10-CM | POA: Diagnosis not present

## 2016-02-09 DIAGNOSIS — R1031 Right lower quadrant pain: Secondary | ICD-10-CM | POA: Diagnosis not present

## 2016-02-09 DIAGNOSIS — Z7389 Other problems related to life management difficulty: Secondary | ICD-10-CM | POA: Diagnosis not present

## 2016-02-09 DIAGNOSIS — M6281 Muscle weakness (generalized): Secondary | ICD-10-CM | POA: Diagnosis not present

## 2016-02-09 DIAGNOSIS — R488 Other symbolic dysfunctions: Secondary | ICD-10-CM | POA: Diagnosis not present

## 2016-02-09 DIAGNOSIS — R2689 Other abnormalities of gait and mobility: Secondary | ICD-10-CM | POA: Diagnosis not present

## 2016-02-10 DIAGNOSIS — Z4789 Encounter for other orthopedic aftercare: Secondary | ICD-10-CM | POA: Diagnosis not present

## 2016-02-10 DIAGNOSIS — R2689 Other abnormalities of gait and mobility: Secondary | ICD-10-CM | POA: Diagnosis not present

## 2016-02-10 DIAGNOSIS — R1031 Right lower quadrant pain: Secondary | ICD-10-CM | POA: Diagnosis not present

## 2016-02-10 DIAGNOSIS — M6281 Muscle weakness (generalized): Secondary | ICD-10-CM | POA: Diagnosis not present

## 2016-02-10 DIAGNOSIS — Z7389 Other problems related to life management difficulty: Secondary | ICD-10-CM | POA: Diagnosis not present

## 2016-02-10 DIAGNOSIS — R278 Other lack of coordination: Secondary | ICD-10-CM | POA: Diagnosis not present

## 2016-02-10 DIAGNOSIS — R488 Other symbolic dysfunctions: Secondary | ICD-10-CM | POA: Diagnosis not present

## 2016-02-11 DIAGNOSIS — M6281 Muscle weakness (generalized): Secondary | ICD-10-CM | POA: Diagnosis not present

## 2016-02-11 DIAGNOSIS — Z7389 Other problems related to life management difficulty: Secondary | ICD-10-CM | POA: Diagnosis not present

## 2016-02-11 DIAGNOSIS — R1031 Right lower quadrant pain: Secondary | ICD-10-CM | POA: Diagnosis not present

## 2016-02-11 DIAGNOSIS — R2689 Other abnormalities of gait and mobility: Secondary | ICD-10-CM | POA: Diagnosis not present

## 2016-02-11 DIAGNOSIS — R278 Other lack of coordination: Secondary | ICD-10-CM | POA: Diagnosis not present

## 2016-02-11 DIAGNOSIS — R488 Other symbolic dysfunctions: Secondary | ICD-10-CM | POA: Diagnosis not present

## 2016-02-12 DIAGNOSIS — R2689 Other abnormalities of gait and mobility: Secondary | ICD-10-CM | POA: Diagnosis not present

## 2016-02-12 DIAGNOSIS — Z7389 Other problems related to life management difficulty: Secondary | ICD-10-CM | POA: Diagnosis not present

## 2016-02-12 DIAGNOSIS — R488 Other symbolic dysfunctions: Secondary | ICD-10-CM | POA: Diagnosis not present

## 2016-02-12 DIAGNOSIS — R1031 Right lower quadrant pain: Secondary | ICD-10-CM | POA: Diagnosis not present

## 2016-02-12 DIAGNOSIS — M6281 Muscle weakness (generalized): Secondary | ICD-10-CM | POA: Diagnosis not present

## 2016-02-12 DIAGNOSIS — R278 Other lack of coordination: Secondary | ICD-10-CM | POA: Diagnosis not present

## 2016-02-15 DIAGNOSIS — R488 Other symbolic dysfunctions: Secondary | ICD-10-CM | POA: Diagnosis not present

## 2016-02-15 DIAGNOSIS — R1031 Right lower quadrant pain: Secondary | ICD-10-CM | POA: Diagnosis not present

## 2016-02-15 DIAGNOSIS — Z7389 Other problems related to life management difficulty: Secondary | ICD-10-CM | POA: Diagnosis not present

## 2016-02-15 DIAGNOSIS — R278 Other lack of coordination: Secondary | ICD-10-CM | POA: Diagnosis not present

## 2016-02-15 DIAGNOSIS — R2689 Other abnormalities of gait and mobility: Secondary | ICD-10-CM | POA: Diagnosis not present

## 2016-02-15 DIAGNOSIS — M6281 Muscle weakness (generalized): Secondary | ICD-10-CM | POA: Diagnosis not present

## 2016-02-16 DIAGNOSIS — M6281 Muscle weakness (generalized): Secondary | ICD-10-CM | POA: Diagnosis not present

## 2016-02-16 DIAGNOSIS — R2689 Other abnormalities of gait and mobility: Secondary | ICD-10-CM | POA: Diagnosis not present

## 2016-02-16 DIAGNOSIS — R1031 Right lower quadrant pain: Secondary | ICD-10-CM | POA: Diagnosis not present

## 2016-02-16 DIAGNOSIS — R488 Other symbolic dysfunctions: Secondary | ICD-10-CM | POA: Diagnosis not present

## 2016-02-16 DIAGNOSIS — R278 Other lack of coordination: Secondary | ICD-10-CM | POA: Diagnosis not present

## 2016-02-16 DIAGNOSIS — Z7389 Other problems related to life management difficulty: Secondary | ICD-10-CM | POA: Diagnosis not present

## 2016-02-17 DIAGNOSIS — Z7389 Other problems related to life management difficulty: Secondary | ICD-10-CM | POA: Diagnosis not present

## 2016-02-17 DIAGNOSIS — R488 Other symbolic dysfunctions: Secondary | ICD-10-CM | POA: Diagnosis not present

## 2016-02-17 DIAGNOSIS — M6281 Muscle weakness (generalized): Secondary | ICD-10-CM | POA: Diagnosis not present

## 2016-02-17 DIAGNOSIS — R1031 Right lower quadrant pain: Secondary | ICD-10-CM | POA: Diagnosis not present

## 2016-02-17 DIAGNOSIS — R278 Other lack of coordination: Secondary | ICD-10-CM | POA: Diagnosis not present

## 2016-02-17 DIAGNOSIS — R2689 Other abnormalities of gait and mobility: Secondary | ICD-10-CM | POA: Diagnosis not present

## 2016-02-18 DIAGNOSIS — R2689 Other abnormalities of gait and mobility: Secondary | ICD-10-CM | POA: Diagnosis not present

## 2016-02-18 DIAGNOSIS — M6281 Muscle weakness (generalized): Secondary | ICD-10-CM | POA: Diagnosis not present

## 2016-02-18 DIAGNOSIS — R488 Other symbolic dysfunctions: Secondary | ICD-10-CM | POA: Diagnosis not present

## 2016-02-18 DIAGNOSIS — R278 Other lack of coordination: Secondary | ICD-10-CM | POA: Diagnosis not present

## 2016-02-18 DIAGNOSIS — R1031 Right lower quadrant pain: Secondary | ICD-10-CM | POA: Diagnosis not present

## 2016-02-18 DIAGNOSIS — Z7389 Other problems related to life management difficulty: Secondary | ICD-10-CM | POA: Diagnosis not present

## 2016-02-19 DIAGNOSIS — R278 Other lack of coordination: Secondary | ICD-10-CM | POA: Diagnosis not present

## 2016-02-19 DIAGNOSIS — R488 Other symbolic dysfunctions: Secondary | ICD-10-CM | POA: Diagnosis not present

## 2016-02-19 DIAGNOSIS — R2689 Other abnormalities of gait and mobility: Secondary | ICD-10-CM | POA: Diagnosis not present

## 2016-02-19 DIAGNOSIS — Z7389 Other problems related to life management difficulty: Secondary | ICD-10-CM | POA: Diagnosis not present

## 2016-02-19 DIAGNOSIS — M6281 Muscle weakness (generalized): Secondary | ICD-10-CM | POA: Diagnosis not present

## 2016-02-19 DIAGNOSIS — R1031 Right lower quadrant pain: Secondary | ICD-10-CM | POA: Diagnosis not present

## 2016-02-22 DIAGNOSIS — R2689 Other abnormalities of gait and mobility: Secondary | ICD-10-CM | POA: Diagnosis not present

## 2016-02-22 DIAGNOSIS — R278 Other lack of coordination: Secondary | ICD-10-CM | POA: Diagnosis not present

## 2016-02-22 DIAGNOSIS — M6281 Muscle weakness (generalized): Secondary | ICD-10-CM | POA: Diagnosis not present

## 2016-02-22 DIAGNOSIS — Z7389 Other problems related to life management difficulty: Secondary | ICD-10-CM | POA: Diagnosis not present

## 2016-02-22 DIAGNOSIS — R488 Other symbolic dysfunctions: Secondary | ICD-10-CM | POA: Diagnosis not present

## 2016-02-22 DIAGNOSIS — R1031 Right lower quadrant pain: Secondary | ICD-10-CM | POA: Diagnosis not present

## 2016-02-23 DIAGNOSIS — M6281 Muscle weakness (generalized): Secondary | ICD-10-CM | POA: Diagnosis not present

## 2016-02-23 DIAGNOSIS — R278 Other lack of coordination: Secondary | ICD-10-CM | POA: Diagnosis not present

## 2016-02-23 DIAGNOSIS — R1031 Right lower quadrant pain: Secondary | ICD-10-CM | POA: Diagnosis not present

## 2016-02-23 DIAGNOSIS — R2689 Other abnormalities of gait and mobility: Secondary | ICD-10-CM | POA: Diagnosis not present

## 2016-02-23 DIAGNOSIS — Z7389 Other problems related to life management difficulty: Secondary | ICD-10-CM | POA: Diagnosis not present

## 2016-02-23 DIAGNOSIS — R488 Other symbolic dysfunctions: Secondary | ICD-10-CM | POA: Diagnosis not present

## 2016-02-24 ENCOUNTER — Encounter: Payer: Medicare Other | Admitting: Internal Medicine

## 2016-02-24 DIAGNOSIS — R1031 Right lower quadrant pain: Secondary | ICD-10-CM | POA: Diagnosis not present

## 2016-02-24 DIAGNOSIS — M6281 Muscle weakness (generalized): Secondary | ICD-10-CM | POA: Diagnosis not present

## 2016-02-24 DIAGNOSIS — Z7389 Other problems related to life management difficulty: Secondary | ICD-10-CM | POA: Diagnosis not present

## 2016-02-24 DIAGNOSIS — R278 Other lack of coordination: Secondary | ICD-10-CM | POA: Diagnosis not present

## 2016-02-24 DIAGNOSIS — R2689 Other abnormalities of gait and mobility: Secondary | ICD-10-CM | POA: Diagnosis not present

## 2016-02-24 DIAGNOSIS — R488 Other symbolic dysfunctions: Secondary | ICD-10-CM | POA: Diagnosis not present

## 2016-02-25 DIAGNOSIS — R1031 Right lower quadrant pain: Secondary | ICD-10-CM | POA: Diagnosis not present

## 2016-02-25 DIAGNOSIS — M6281 Muscle weakness (generalized): Secondary | ICD-10-CM | POA: Diagnosis not present

## 2016-02-25 DIAGNOSIS — Z7389 Other problems related to life management difficulty: Secondary | ICD-10-CM | POA: Diagnosis not present

## 2016-02-25 DIAGNOSIS — R2689 Other abnormalities of gait and mobility: Secondary | ICD-10-CM | POA: Diagnosis not present

## 2016-02-25 DIAGNOSIS — R488 Other symbolic dysfunctions: Secondary | ICD-10-CM | POA: Diagnosis not present

## 2016-02-25 DIAGNOSIS — R278 Other lack of coordination: Secondary | ICD-10-CM | POA: Diagnosis not present

## 2016-02-26 DIAGNOSIS — R278 Other lack of coordination: Secondary | ICD-10-CM | POA: Diagnosis not present

## 2016-02-26 DIAGNOSIS — M6281 Muscle weakness (generalized): Secondary | ICD-10-CM | POA: Diagnosis not present

## 2016-02-26 DIAGNOSIS — R2689 Other abnormalities of gait and mobility: Secondary | ICD-10-CM | POA: Diagnosis not present

## 2016-02-26 DIAGNOSIS — R1031 Right lower quadrant pain: Secondary | ICD-10-CM | POA: Diagnosis not present

## 2016-02-26 DIAGNOSIS — Z7389 Other problems related to life management difficulty: Secondary | ICD-10-CM | POA: Diagnosis not present

## 2016-02-26 DIAGNOSIS — R488 Other symbolic dysfunctions: Secondary | ICD-10-CM | POA: Diagnosis not present

## 2016-02-29 DIAGNOSIS — R488 Other symbolic dysfunctions: Secondary | ICD-10-CM | POA: Diagnosis not present

## 2016-02-29 DIAGNOSIS — Z7389 Other problems related to life management difficulty: Secondary | ICD-10-CM | POA: Diagnosis not present

## 2016-02-29 DIAGNOSIS — M84451S Pathological fracture, right femur, sequela: Secondary | ICD-10-CM | POA: Diagnosis not present

## 2016-02-29 DIAGNOSIS — R278 Other lack of coordination: Secondary | ICD-10-CM | POA: Diagnosis not present

## 2016-02-29 DIAGNOSIS — M6281 Muscle weakness (generalized): Secondary | ICD-10-CM | POA: Diagnosis not present

## 2016-02-29 DIAGNOSIS — R102 Pelvic and perineal pain: Secondary | ICD-10-CM | POA: Diagnosis not present

## 2016-02-29 DIAGNOSIS — G6289 Other specified polyneuropathies: Secondary | ICD-10-CM | POA: Diagnosis not present

## 2016-02-29 DIAGNOSIS — Z9181 History of falling: Secondary | ICD-10-CM | POA: Diagnosis not present

## 2016-02-29 DIAGNOSIS — Z4789 Encounter for other orthopedic aftercare: Secondary | ICD-10-CM | POA: Diagnosis not present

## 2016-02-29 DIAGNOSIS — R1031 Right lower quadrant pain: Secondary | ICD-10-CM | POA: Diagnosis not present

## 2016-02-29 DIAGNOSIS — M25551 Pain in right hip: Secondary | ICD-10-CM | POA: Diagnosis not present

## 2016-02-29 DIAGNOSIS — R2689 Other abnormalities of gait and mobility: Secondary | ICD-10-CM | POA: Diagnosis not present

## 2016-02-29 DIAGNOSIS — R1312 Dysphagia, oropharyngeal phase: Secondary | ICD-10-CM | POA: Diagnosis not present

## 2016-02-29 DIAGNOSIS — F0390 Unspecified dementia without behavioral disturbance: Secondary | ICD-10-CM | POA: Diagnosis not present

## 2016-03-01 DIAGNOSIS — R278 Other lack of coordination: Secondary | ICD-10-CM | POA: Diagnosis not present

## 2016-03-01 DIAGNOSIS — R488 Other symbolic dysfunctions: Secondary | ICD-10-CM | POA: Diagnosis not present

## 2016-03-01 DIAGNOSIS — R2689 Other abnormalities of gait and mobility: Secondary | ICD-10-CM | POA: Diagnosis not present

## 2016-03-01 DIAGNOSIS — M6281 Muscle weakness (generalized): Secondary | ICD-10-CM | POA: Diagnosis not present

## 2016-03-01 DIAGNOSIS — Z7389 Other problems related to life management difficulty: Secondary | ICD-10-CM | POA: Diagnosis not present

## 2016-03-01 DIAGNOSIS — R1031 Right lower quadrant pain: Secondary | ICD-10-CM | POA: Diagnosis not present

## 2016-03-02 DIAGNOSIS — R1031 Right lower quadrant pain: Secondary | ICD-10-CM | POA: Diagnosis not present

## 2016-03-02 DIAGNOSIS — R2689 Other abnormalities of gait and mobility: Secondary | ICD-10-CM | POA: Diagnosis not present

## 2016-03-02 DIAGNOSIS — M6281 Muscle weakness (generalized): Secondary | ICD-10-CM | POA: Diagnosis not present

## 2016-03-02 DIAGNOSIS — R488 Other symbolic dysfunctions: Secondary | ICD-10-CM | POA: Diagnosis not present

## 2016-03-02 DIAGNOSIS — Z7389 Other problems related to life management difficulty: Secondary | ICD-10-CM | POA: Diagnosis not present

## 2016-03-02 DIAGNOSIS — R278 Other lack of coordination: Secondary | ICD-10-CM | POA: Diagnosis not present

## 2016-03-03 ENCOUNTER — Encounter: Payer: Self-pay | Admitting: Adult Health

## 2016-03-03 ENCOUNTER — Non-Acute Institutional Stay (SKILLED_NURSING_FACILITY): Payer: Medicare Other | Admitting: Adult Health

## 2016-03-03 DIAGNOSIS — R634 Abnormal weight loss: Secondary | ICD-10-CM

## 2016-03-03 DIAGNOSIS — S72001D Fracture of unspecified part of neck of right femur, subsequent encounter for closed fracture with routine healing: Secondary | ICD-10-CM | POA: Diagnosis not present

## 2016-03-03 DIAGNOSIS — R350 Frequency of micturition: Secondary | ICD-10-CM | POA: Diagnosis not present

## 2016-03-03 DIAGNOSIS — G250 Essential tremor: Secondary | ICD-10-CM

## 2016-03-03 DIAGNOSIS — F0391 Unspecified dementia with behavioral disturbance: Secondary | ICD-10-CM | POA: Diagnosis not present

## 2016-03-03 DIAGNOSIS — F03918 Unspecified dementia, unspecified severity, with other behavioral disturbance: Secondary | ICD-10-CM

## 2016-03-03 DIAGNOSIS — R488 Other symbolic dysfunctions: Secondary | ICD-10-CM | POA: Diagnosis not present

## 2016-03-03 DIAGNOSIS — R2689 Other abnormalities of gait and mobility: Secondary | ICD-10-CM | POA: Diagnosis not present

## 2016-03-03 DIAGNOSIS — K5900 Constipation, unspecified: Secondary | ICD-10-CM

## 2016-03-03 DIAGNOSIS — M6281 Muscle weakness (generalized): Secondary | ICD-10-CM | POA: Diagnosis not present

## 2016-03-03 DIAGNOSIS — Z7389 Other problems related to life management difficulty: Secondary | ICD-10-CM | POA: Diagnosis not present

## 2016-03-03 DIAGNOSIS — S32501D Unspecified fracture of right pubis, subsequent encounter for fracture with routine healing: Secondary | ICD-10-CM

## 2016-03-03 DIAGNOSIS — K5909 Other constipation: Secondary | ICD-10-CM

## 2016-03-03 DIAGNOSIS — R300 Dysuria: Secondary | ICD-10-CM | POA: Diagnosis not present

## 2016-03-03 DIAGNOSIS — G6289 Other specified polyneuropathies: Secondary | ICD-10-CM | POA: Diagnosis not present

## 2016-03-03 DIAGNOSIS — L239 Allergic contact dermatitis, unspecified cause: Secondary | ICD-10-CM

## 2016-03-03 DIAGNOSIS — N390 Urinary tract infection, site not specified: Secondary | ICD-10-CM | POA: Diagnosis not present

## 2016-03-03 DIAGNOSIS — R278 Other lack of coordination: Secondary | ICD-10-CM | POA: Diagnosis not present

## 2016-03-03 DIAGNOSIS — R319 Hematuria, unspecified: Secondary | ICD-10-CM | POA: Diagnosis not present

## 2016-03-03 DIAGNOSIS — R1031 Right lower quadrant pain: Secondary | ICD-10-CM | POA: Diagnosis not present

## 2016-03-03 DIAGNOSIS — S32591D Other specified fracture of right pubis, subsequent encounter for fracture with routine healing: Secondary | ICD-10-CM

## 2016-03-03 NOTE — Progress Notes (Signed)
Location:   [facility]   Place of Service:  SNF (31) Provider:   Cindi Carbon, [place] [phone]   REED, Jonelle Sidle, DO  Patient Care Team: Gayland Curry, DO as PCP - General (Geriatric Medicine) Bjorn Loser, MD as Consulting Physician (Urology)  Extended Emergency Contact Information Primary Emergency Contact: [facility] Address: [address]          [place], [place] [address] Johnnette Litter of [place] Phone: [phone] Work Phone: [phone] Mobile Phone: [phone] Relation: Daughter Secondary Emergency Contact: [facility] Address: [address]          [place], [place] [address] Johnnette Litter of [place] Phone: [phone] Mobile Phone: [phone] Relation: Other  Code Status:  DNR Goals of care: Advanced Directive information Advanced Directives 02/02/2016  Does patient have an advance directive? Yes  Type of Advance Directive Out of facility DNR (pink MOST or yellow form);[place];Living will  Copy of advanced directive(s) in chart? Yes  Pre-existing out of facility DNR order (yellow form or pink MOST form) Yellow form placed in chart (order not valid for inpatient use)     Chief Complaint  Patient presents with  . Medical Management of Chronic Issues    HPI:  Pt is a 82 y.o. female seen today for medical management of chronic diseases.  She fell at a restaurant in May and had an impacted right femoral neck fracture and right pubic ramus fracture. She underwent an hip pinning on 5/31.  There has been concern that due to her falls and mild memory impairment and OCD behaviors, she would be better in assisted living once she completes rehab for this issue. She previously resided in [place].  The resident reports that she is not ready for AL. She is currently able to pay her bills, give her self meds, bath, dress, etc. She still needs assistance as her weight bearing status is 25%.   Staff is  concerned about her weight at 104-106 lbs. She reports she has been wanting to lose weight and is eating less on purpose as she just spent money on new clothes and doesn't want to out grow them. She denies depression but does report not sleeping well due to urinary frequency and low abd pain, denies a fever or back pain. She has a hx of chronic cystitis and uses keflex chronically.   Notes from her neurologist in [place] indicate that her weight loss was concerning and she had some numbness in her feet.  Between these two symptoms there was concern for neuropathy induced by malignancy.  She still has some mild numbness but no foot pain, back pain etc.   Reports normal BMs using miralax. Has an essential tremor she has had for years.  MMSE 23/30 because she refused serial 7's.  Did well with clock draw, orientation and missed one item on recall. Her score is likely much higher than interpreted.   Past Medical History  Diagnosis Date  . Cystitis, interstitial   . Tremors of nervous system   . Allergic contact dermatitis   . Vertigo   . Arthritis   . Osteopenia   . Hepatitis     over 50 years ago  . Long-term use of high-risk medication   . Fracture of right inferior pubic ramus ([place]) 01/26/16  . Femur fracture, right ([place]) 01/26/16   Past Surgical History  Procedure Laterality Date  . Tonsillectomy    . Appendectomy    . Hip orif w/  capsulotomy Left 10/22/2013    [name]   . Fracture surgery  2015    left hip fracture  . Dilatation & curettage/hysteroscopy with myosure N/A 06/26/2015    Procedure: DILATATION & CURETTAGE/HYSTEROSCOPY WITH MYOSURE;  Surgeon: Arvella Nigh, MD;  Location: [facility];  Service: Gynecology;  Laterality: N/A;  . Right hip percutaneous pinning of impacted femoral neck fracture  01/26/16    [facility]--Dr. Jaynee Eagles orthopedics    Allergies  Allergen Reactions  . Ham Other (See Comments)  . Latex Rash  . Other Other (See Comments)    Pt reports was seen by  neurologist for tremors and was allergic to all medications prescribed for tremors, but does not know names or reaction. Patient reports being allergic to polyester. Does not know what reaction would be. States she was seen by a doctor who tested her for multiple allergies and she had positive results.  . Ciprofloxacin Other (See Comments)    Doesn't work  . Doxycycline Other (See Comments)    unknown  . Erythromycin     Noted from Neurologist in [place]  . Levaquin [Levofloxacin In D5w] Other (See Comments)    unknown  . Lopid [Gemfibrozil]   . Macrobid [employer] Macro] Other (See Comments)    Doesn't work  . Sulfa Antibiotics Other (See Comments)    unknown      Medication List       This list is accurate as of: 03/03/16 12:11 PM.  Always use your most recent med list.               acetaminophen 325 MG tablet  Commonly known as:  TYLENOL  Take 650 mg by mouth every 6 (six) hours as needed (do not exceed 3 gm of APAP in 24 hours including tylenol).     aspirin 325 MG tablet  Take 325 mg by mouth 2 (two) times daily. X 4 weeks     CALCIUM 600+D3 600-800 MG-UNIT Tabs  Generic drug:  Calcium Carb-Cholecalciferol  Take 1 tablet by mouth daily. Take one tablet daily     cephALEXin 250 MG capsule  Commonly known as:  KEFLEX  Take 250 mg by mouth daily before lunch.     D3 HIGH POTENCY 1000 units capsule  Generic drug:  Cholecalciferol  Take 1,000 Units by mouth daily. Take one tablet daily     docusate sodium 100 MG capsule  Commonly known as:  COLACE  Take 200 mg by mouth daily.     Fish Oil 1200 MG Caps  Take 2 capsules by mouth daily. Take one tablet twice daily     fluocinolone 0.01 % cream  Commonly known as:  VANOS  Apply 1 application topically 2 (two) times daily as needed (skin rash).     Magnesium 400 MG Caps  Take 1 tablet by mouth daily. Take one tablet daily     MYRBETRIQ 50 MG Tb24 tablet  Generic drug:  mirabegron ER  Take 50 mg by  mouth daily.     polyethylene glycol packet  Commonly known as:  MIRALAX / GLYCOLAX  Take 17 g by mouth daily.     raloxifene 60 MG tablet  Commonly known as:  EVISTA  Take 1 tablet (60 mg total) by mouth daily. Take one table daily     vitamin B-12 1000 MCG tablet  Commonly known as:  CYANOCOBALAMIN  Take 1,000 mcg by mouth every other day.        Review  of Systems  Constitutional: Positive for unexpected weight change. Negative for fever, chills, diaphoresis, activity change, appetite change and fatigue.  HENT: Negative for congestion.   Respiratory: Negative for cough, shortness of breath and wheezing.   Cardiovascular: Negative for chest pain, palpitations and leg swelling.  Gastrointestinal: Negative for abdominal pain, diarrhea, constipation and abdominal distention.  Genitourinary: Positive for dysuria, frequency, enuresis and pelvic pain. Negative for hematuria, flank pain and difficulty urinating.  Musculoskeletal: Positive for arthralgias and gait problem. Negative for myalgias, back pain and joint swelling.  Neurological: Positive for tremors and numbness (toes). Negative for dizziness, seizures, syncope, facial asymmetry, speech difficulty, weakness, light-headedness and headaches.  Psychiatric/Behavioral: Negative for behavioral problems, confusion and agitation.    Immunization History  Administered Date(s) Administered  . Influenza-Unspecified 06/18/2015   Pertinent  Health Maintenance Due  Topic Date Due  . DEXA SCAN  07/20/1996  . PNA vac Low Risk Adult (1 of 2 - PCV13) 08/29/2018 (Originally 07/20/1996)  . INFLUENZA VACCINE  03/29/2016   Fall Risk  11/25/2015 04/29/2015  Falls in the past year? No Yes  Number falls in past yr: - 1  Injury with Fall? - Yes   Functional Status Survey: Is the patient deaf or have difficulty hearing?: No Does the patient have difficulty seeing, even when wearing glasses/contacts?: No Does the patient have difficulty  concentrating, remembering, or making decisions?: No Does the patient have difficulty walking or climbing stairs?: Yes Does the patient have difficulty dressing or bathing?: No Does the patient have difficulty doing errands alone such as visiting a doctor's office or shopping?: Yes  Filed Vitals:   03/03/16 1029  BP: 126/87  Pulse: 72  Temp: 97 F (36.1 C)  Resp: 20  Weight: 106 lb (48.081 kg)  SpO2: 98%   Body mass index is 20.7 kg/(m^2). Physical Exam  Constitutional: She is oriented to person, place, and time. No distress.  HENT:  Head: Normocephalic and atraumatic.  Neck: No JVD present.  Cardiovascular: Normal rate and regular rhythm.   No murmur heard. Pulmonary/Chest: Effort normal and breath sounds normal. No respiratory distress. She has no wheezes.  Abdominal: Soft. Bowel sounds are normal. She exhibits no distension. There is no tenderness.  No back pain  Musculoskeletal: She exhibits no edema or tenderness.  Strength 5/5 to BUE, 3/5 to BLE  Neurological: She is alert and oriented to person, place, and time.  Tremor noted to head and hands  Skin: Skin is warm and dry. She is not diaphoretic.  Psychiatric: She has a normal mood and affect.    Labs reviewed:  Recent Labs  11/20/15 01/28/16  NA 138 140  K 4.3 4.4  BUN 33* 26*  CREATININE 1.5* 1.2*    Recent Labs  11/20/15  AST 22  ALT 8  ALKPHOS 39    Recent Labs  06/24/15 0815 11/20/15 01/28/16 02/01/16 0500  WBC 6.6 4.2 6.7 5.3  HGB 12.8 13.5 11.8* 12.5  HCT 40.1 39 37 42  MCV 95.9  --   --   --   PLT 171 176 134* 184   No results found for: TSH No results found for: HGBA1C No results found for: CHOL, HDL, LDLCALC, LDLDIRECT, TRIG, CHOLHDL  Significant Diagnostic Results in last 30 days:  No results found.  Assessment/Plan  1. Dementia with behavioral disturbance Very mild and remains fairly independent on rehab No meds at this time She is best suited for AL but given how independent  she is we  could considering sending [place] home [place] caregivers to alleviate the burden from her daughter.  Will discuss at future meeting.  2. Chronic constipation Continue miralax  3. Benign essential tremor Unchanged, did not like inderal  4. Other polyneuropathy ([place]) Continue B12 denies pain   5. Allergic contact dermatitis due to multiple agents Avoidance of triggers such as polyester and other fabrics  6. Recurrent UTI Reports abd pain and frequency Check UA C and S Continue keflex and myrbetriq  7. Closed fracture of single pubic ramus of pelvis, right, with routine healing, subsequent encounter Continue PT, see below  8. Closed fracture of neck of right femur with routine healing, subsequent encounter S/p hip pinning Continue tylenol for pain F/u with ortho next week  9. Loss of weight Declined remeron Check TSH Most likely due to her memory loss and dieting by choice  Family/ staff Communication: discussed with resident and staff  Labs/tests ordered:   TSH, UA C and S Apt next week with ortho

## 2016-03-04 DIAGNOSIS — R278 Other lack of coordination: Secondary | ICD-10-CM | POA: Diagnosis not present

## 2016-03-04 DIAGNOSIS — Z7389 Other problems related to life management difficulty: Secondary | ICD-10-CM | POA: Diagnosis not present

## 2016-03-04 DIAGNOSIS — M6281 Muscle weakness (generalized): Secondary | ICD-10-CM | POA: Diagnosis not present

## 2016-03-04 DIAGNOSIS — R1031 Right lower quadrant pain: Secondary | ICD-10-CM | POA: Diagnosis not present

## 2016-03-04 DIAGNOSIS — R2689 Other abnormalities of gait and mobility: Secondary | ICD-10-CM | POA: Diagnosis not present

## 2016-03-04 DIAGNOSIS — R634 Abnormal weight loss: Secondary | ICD-10-CM | POA: Diagnosis not present

## 2016-03-04 DIAGNOSIS — R488 Other symbolic dysfunctions: Secondary | ICD-10-CM | POA: Diagnosis not present

## 2016-03-04 LAB — TSH: TSH: 2.19 u[IU]/mL (ref 0.41–5.90)

## 2016-03-07 DIAGNOSIS — R1031 Right lower quadrant pain: Secondary | ICD-10-CM | POA: Diagnosis not present

## 2016-03-07 DIAGNOSIS — R2689 Other abnormalities of gait and mobility: Secondary | ICD-10-CM | POA: Diagnosis not present

## 2016-03-07 DIAGNOSIS — R278 Other lack of coordination: Secondary | ICD-10-CM | POA: Diagnosis not present

## 2016-03-07 DIAGNOSIS — R488 Other symbolic dysfunctions: Secondary | ICD-10-CM | POA: Diagnosis not present

## 2016-03-07 DIAGNOSIS — Z7389 Other problems related to life management difficulty: Secondary | ICD-10-CM | POA: Diagnosis not present

## 2016-03-07 DIAGNOSIS — M6281 Muscle weakness (generalized): Secondary | ICD-10-CM | POA: Diagnosis not present

## 2016-03-08 DIAGNOSIS — R488 Other symbolic dysfunctions: Secondary | ICD-10-CM | POA: Diagnosis not present

## 2016-03-08 DIAGNOSIS — R1031 Right lower quadrant pain: Secondary | ICD-10-CM | POA: Diagnosis not present

## 2016-03-08 DIAGNOSIS — M6281 Muscle weakness (generalized): Secondary | ICD-10-CM | POA: Diagnosis not present

## 2016-03-08 DIAGNOSIS — R2689 Other abnormalities of gait and mobility: Secondary | ICD-10-CM | POA: Diagnosis not present

## 2016-03-08 DIAGNOSIS — Z7389 Other problems related to life management difficulty: Secondary | ICD-10-CM | POA: Diagnosis not present

## 2016-03-08 DIAGNOSIS — R278 Other lack of coordination: Secondary | ICD-10-CM | POA: Diagnosis not present

## 2016-03-09 DIAGNOSIS — R488 Other symbolic dysfunctions: Secondary | ICD-10-CM | POA: Diagnosis not present

## 2016-03-09 DIAGNOSIS — M6281 Muscle weakness (generalized): Secondary | ICD-10-CM | POA: Diagnosis not present

## 2016-03-09 DIAGNOSIS — R1031 Right lower quadrant pain: Secondary | ICD-10-CM | POA: Diagnosis not present

## 2016-03-09 DIAGNOSIS — Z7389 Other problems related to life management difficulty: Secondary | ICD-10-CM | POA: Diagnosis not present

## 2016-03-09 DIAGNOSIS — R2689 Other abnormalities of gait and mobility: Secondary | ICD-10-CM | POA: Diagnosis not present

## 2016-03-09 DIAGNOSIS — R278 Other lack of coordination: Secondary | ICD-10-CM | POA: Diagnosis not present

## 2016-03-10 DIAGNOSIS — R1031 Right lower quadrant pain: Secondary | ICD-10-CM | POA: Diagnosis not present

## 2016-03-10 DIAGNOSIS — R2689 Other abnormalities of gait and mobility: Secondary | ICD-10-CM | POA: Diagnosis not present

## 2016-03-10 DIAGNOSIS — R488 Other symbolic dysfunctions: Secondary | ICD-10-CM | POA: Diagnosis not present

## 2016-03-10 DIAGNOSIS — Z7389 Other problems related to life management difficulty: Secondary | ICD-10-CM | POA: Diagnosis not present

## 2016-03-10 DIAGNOSIS — Z4789 Encounter for other orthopedic aftercare: Secondary | ICD-10-CM | POA: Diagnosis not present

## 2016-03-10 DIAGNOSIS — M6281 Muscle weakness (generalized): Secondary | ICD-10-CM | POA: Diagnosis not present

## 2016-03-10 DIAGNOSIS — Z967 Presence of other bone and tendon implants: Secondary | ICD-10-CM | POA: Diagnosis not present

## 2016-03-10 DIAGNOSIS — R278 Other lack of coordination: Secondary | ICD-10-CM | POA: Diagnosis not present

## 2016-03-11 DIAGNOSIS — R488 Other symbolic dysfunctions: Secondary | ICD-10-CM | POA: Diagnosis not present

## 2016-03-11 DIAGNOSIS — R278 Other lack of coordination: Secondary | ICD-10-CM | POA: Diagnosis not present

## 2016-03-11 DIAGNOSIS — M6281 Muscle weakness (generalized): Secondary | ICD-10-CM | POA: Diagnosis not present

## 2016-03-11 DIAGNOSIS — R2689 Other abnormalities of gait and mobility: Secondary | ICD-10-CM | POA: Diagnosis not present

## 2016-03-11 DIAGNOSIS — R1031 Right lower quadrant pain: Secondary | ICD-10-CM | POA: Diagnosis not present

## 2016-03-11 DIAGNOSIS — Z7389 Other problems related to life management difficulty: Secondary | ICD-10-CM | POA: Diagnosis not present

## 2016-03-14 DIAGNOSIS — R488 Other symbolic dysfunctions: Secondary | ICD-10-CM | POA: Diagnosis not present

## 2016-03-14 DIAGNOSIS — Z7389 Other problems related to life management difficulty: Secondary | ICD-10-CM | POA: Diagnosis not present

## 2016-03-14 DIAGNOSIS — R1031 Right lower quadrant pain: Secondary | ICD-10-CM | POA: Diagnosis not present

## 2016-03-14 DIAGNOSIS — R278 Other lack of coordination: Secondary | ICD-10-CM | POA: Diagnosis not present

## 2016-03-14 DIAGNOSIS — M6281 Muscle weakness (generalized): Secondary | ICD-10-CM | POA: Diagnosis not present

## 2016-03-14 DIAGNOSIS — R2689 Other abnormalities of gait and mobility: Secondary | ICD-10-CM | POA: Diagnosis not present

## 2016-03-15 DIAGNOSIS — M6281 Muscle weakness (generalized): Secondary | ICD-10-CM | POA: Diagnosis not present

## 2016-03-15 DIAGNOSIS — R488 Other symbolic dysfunctions: Secondary | ICD-10-CM | POA: Diagnosis not present

## 2016-03-15 DIAGNOSIS — R278 Other lack of coordination: Secondary | ICD-10-CM | POA: Diagnosis not present

## 2016-03-15 DIAGNOSIS — R2689 Other abnormalities of gait and mobility: Secondary | ICD-10-CM | POA: Diagnosis not present

## 2016-03-15 DIAGNOSIS — Z7389 Other problems related to life management difficulty: Secondary | ICD-10-CM | POA: Diagnosis not present

## 2016-03-15 DIAGNOSIS — R1031 Right lower quadrant pain: Secondary | ICD-10-CM | POA: Diagnosis not present

## 2016-03-16 DIAGNOSIS — R278 Other lack of coordination: Secondary | ICD-10-CM | POA: Diagnosis not present

## 2016-03-16 DIAGNOSIS — R1031 Right lower quadrant pain: Secondary | ICD-10-CM | POA: Diagnosis not present

## 2016-03-16 DIAGNOSIS — Z7389 Other problems related to life management difficulty: Secondary | ICD-10-CM | POA: Diagnosis not present

## 2016-03-16 DIAGNOSIS — R2689 Other abnormalities of gait and mobility: Secondary | ICD-10-CM | POA: Diagnosis not present

## 2016-03-16 DIAGNOSIS — M6281 Muscle weakness (generalized): Secondary | ICD-10-CM | POA: Diagnosis not present

## 2016-03-16 DIAGNOSIS — R488 Other symbolic dysfunctions: Secondary | ICD-10-CM | POA: Diagnosis not present

## 2016-03-17 DIAGNOSIS — R278 Other lack of coordination: Secondary | ICD-10-CM | POA: Diagnosis not present

## 2016-03-17 DIAGNOSIS — R2689 Other abnormalities of gait and mobility: Secondary | ICD-10-CM | POA: Diagnosis not present

## 2016-03-17 DIAGNOSIS — M6281 Muscle weakness (generalized): Secondary | ICD-10-CM | POA: Diagnosis not present

## 2016-03-17 DIAGNOSIS — R1031 Right lower quadrant pain: Secondary | ICD-10-CM | POA: Diagnosis not present

## 2016-03-17 DIAGNOSIS — R488 Other symbolic dysfunctions: Secondary | ICD-10-CM | POA: Diagnosis not present

## 2016-03-17 DIAGNOSIS — Z7389 Other problems related to life management difficulty: Secondary | ICD-10-CM | POA: Diagnosis not present

## 2016-03-18 DIAGNOSIS — R2689 Other abnormalities of gait and mobility: Secondary | ICD-10-CM | POA: Diagnosis not present

## 2016-03-18 DIAGNOSIS — Z7389 Other problems related to life management difficulty: Secondary | ICD-10-CM | POA: Diagnosis not present

## 2016-03-18 DIAGNOSIS — R278 Other lack of coordination: Secondary | ICD-10-CM | POA: Diagnosis not present

## 2016-03-18 DIAGNOSIS — R1031 Right lower quadrant pain: Secondary | ICD-10-CM | POA: Diagnosis not present

## 2016-03-18 DIAGNOSIS — R488 Other symbolic dysfunctions: Secondary | ICD-10-CM | POA: Diagnosis not present

## 2016-03-18 DIAGNOSIS — M6281 Muscle weakness (generalized): Secondary | ICD-10-CM | POA: Diagnosis not present

## 2016-03-21 DIAGNOSIS — R1031 Right lower quadrant pain: Secondary | ICD-10-CM | POA: Diagnosis not present

## 2016-03-21 DIAGNOSIS — R2689 Other abnormalities of gait and mobility: Secondary | ICD-10-CM | POA: Diagnosis not present

## 2016-03-21 DIAGNOSIS — Z7389 Other problems related to life management difficulty: Secondary | ICD-10-CM | POA: Diagnosis not present

## 2016-03-21 DIAGNOSIS — M6281 Muscle weakness (generalized): Secondary | ICD-10-CM | POA: Diagnosis not present

## 2016-03-21 DIAGNOSIS — R278 Other lack of coordination: Secondary | ICD-10-CM | POA: Diagnosis not present

## 2016-03-21 DIAGNOSIS — R488 Other symbolic dysfunctions: Secondary | ICD-10-CM | POA: Diagnosis not present

## 2016-03-22 DIAGNOSIS — Z7389 Other problems related to life management difficulty: Secondary | ICD-10-CM | POA: Diagnosis not present

## 2016-03-22 DIAGNOSIS — R1031 Right lower quadrant pain: Secondary | ICD-10-CM | POA: Diagnosis not present

## 2016-03-22 DIAGNOSIS — R2689 Other abnormalities of gait and mobility: Secondary | ICD-10-CM | POA: Diagnosis not present

## 2016-03-22 DIAGNOSIS — M6281 Muscle weakness (generalized): Secondary | ICD-10-CM | POA: Diagnosis not present

## 2016-03-22 DIAGNOSIS — R278 Other lack of coordination: Secondary | ICD-10-CM | POA: Diagnosis not present

## 2016-03-22 DIAGNOSIS — R488 Other symbolic dysfunctions: Secondary | ICD-10-CM | POA: Diagnosis not present

## 2016-03-23 DIAGNOSIS — R1031 Right lower quadrant pain: Secondary | ICD-10-CM | POA: Diagnosis not present

## 2016-03-23 DIAGNOSIS — R278 Other lack of coordination: Secondary | ICD-10-CM | POA: Diagnosis not present

## 2016-03-23 DIAGNOSIS — R2689 Other abnormalities of gait and mobility: Secondary | ICD-10-CM | POA: Diagnosis not present

## 2016-03-23 DIAGNOSIS — Z7389 Other problems related to life management difficulty: Secondary | ICD-10-CM | POA: Diagnosis not present

## 2016-03-23 DIAGNOSIS — R488 Other symbolic dysfunctions: Secondary | ICD-10-CM | POA: Diagnosis not present

## 2016-03-23 DIAGNOSIS — M6281 Muscle weakness (generalized): Secondary | ICD-10-CM | POA: Diagnosis not present

## 2016-03-24 ENCOUNTER — Non-Acute Institutional Stay (SKILLED_NURSING_FACILITY): Payer: Medicare Other | Admitting: Adult Health

## 2016-03-24 ENCOUNTER — Encounter: Payer: Self-pay | Admitting: Adult Health

## 2016-03-24 DIAGNOSIS — M6281 Muscle weakness (generalized): Secondary | ICD-10-CM | POA: Diagnosis not present

## 2016-03-24 DIAGNOSIS — H6591 Unspecified nonsuppurative otitis media, right ear: Secondary | ICD-10-CM | POA: Diagnosis not present

## 2016-03-24 DIAGNOSIS — R488 Other symbolic dysfunctions: Secondary | ICD-10-CM | POA: Diagnosis not present

## 2016-03-24 DIAGNOSIS — Z7389 Other problems related to life management difficulty: Secondary | ICD-10-CM | POA: Diagnosis not present

## 2016-03-24 DIAGNOSIS — R278 Other lack of coordination: Secondary | ICD-10-CM | POA: Diagnosis not present

## 2016-03-24 DIAGNOSIS — R2689 Other abnormalities of gait and mobility: Secondary | ICD-10-CM | POA: Diagnosis not present

## 2016-03-24 DIAGNOSIS — R1031 Right lower quadrant pain: Secondary | ICD-10-CM | POA: Diagnosis not present

## 2016-03-24 NOTE — Progress Notes (Signed)
Patient ID: Melanie Freeman, female   DOB: 1929-06-22, 82 y.o.   MRN: [id number]   Location:   [facility]   Place of Service:   SNF Provider:   Cindi Carbon, ANP [facility] [phone]  Hollace Kinnier, DO  Patient Care Team: Gayland Curry, DO as PCP - General (Geriatric Medicine) Bjorn Loser, MD as Consulting Physician (Urology)  Extended Emergency Contact Information Primary Emergency Contact: [facility] Address: [address]          [place], [place] [address] Johnnette Litter of [place] Phone: [phone] Work Phone: [phone] Mobile Phone: [phone] Relation: Daughter Secondary Emergency Contact: [facility] Address: [address]          [place], [place] [address] Johnnette Litter of [place] Phone: [phone] Mobile Phone: [phone] Relation: Other  Code Status:  DNR Goals of care: Advanced Directive information Advanced Directives 02/02/2016  Does patient have an advance directive? Yes  Type of Advance Directive Out of facility DNR (pink MOST or yellow form);[place];Living will  Does patient want to make changes to advanced directive? -  Copy of advanced directive(s) in chart? Yes  Would patient like information on creating an advanced directive? -  Pre-existing out of facility DNR order (yellow form or pink MOST form) Yellow form placed in chart (order not valid for inpatient use)     Chief Complaint  Patient presents with  . Acute Visit    right otalgia    HPI:  Pt is a 82 y.o. female seen today for an acute visit for right otalgia present for 3 days. She has not had a fever, drainage, nasal congestion, cough, sob, cp, etc.  Reports pain that wakes her up at night behind her right ear and finds some relief with tylenol. Has not had ear infections her "whole life".     Past Medical History:  Diagnosis Date  . Allergic contact dermatitis   . Arthritis   . Closed fracture of neck of femur ([place])  01/25/2016  . Cystitis, interstitial   . Femur fracture, right ([place]) 01/26/16  . Fracture of right inferior pubic ramus ([place]) 01/26/16  . Hepatitis    over 50 years ago  . Long-term use of high-risk medication   . Osteopenia   . Tremors of nervous system   . Vertigo    Past Surgical History:  Procedure Laterality Date  . APPENDECTOMY    . DILATATION & CURETTAGE/HYSTEROSCOPY WITH MYOSURE N/A 06/26/2015   Procedure: DILATATION & CURETTAGE/HYSTEROSCOPY WITH MYOSURE;  Surgeon: Arvella Nigh, MD;  Location: [facility];  Service: Gynecology;  Laterality: N/A;  . FRACTURE SURGERY  2015   left hip fracture  . HIP ORIF W/ CAPSULOTOMY Left 10/22/2013   [name]   . RIGHT hip percutaneous pinning of impacted femoral neck fracture  01/26/16   [facility]--Dr. Jaynee Eagles orthopedics  . TONSILLECTOMY      Allergies  Allergen Reactions  . Ham Other (See Comments)  . Latex Rash  . Other Other (See Comments)    Pt reports was seen by neurologist for tremors and was allergic to all medications prescribed for tremors, but does not know names or reaction. Patient reports being allergic to polyester. Does not know what reaction would be. States she was seen by a doctor who tested her for multiple allergies and she had positive results.  . Ciprofloxacin Other (See Comments)    Doesn't work  . Doxycycline Other (See Comments)    unknown  . Erythromycin  Noted from Neurologist in [place]  . Levaquin [Levofloxacin In D5w] Other (See Comments)    unknown  . Lopid [Gemfibrozil]   . Macrobid [employer] Macro] Other (See Comments)    Doesn't work  . Sulfa Antibiotics Other (See Comments)    unknown      Medication List       Accurate as of 03/24/16  3:14 PM. Always use your most recent med list.          acetaminophen 325 MG tablet Commonly known as:  TYLENOL Take 650 mg by mouth every 6 (six) hours as needed (do not exceed 3 gm of APAP in 24 hours including tylenol).   aspirin  325 MG tablet Take 325 mg by mouth 2 (two) times daily. X 4 weeks   CALCIUM 600+D3 600-800 MG-UNIT Tabs Generic drug:  Calcium Carb-Cholecalciferol Take 1 tablet by mouth daily. Take one tablet daily   cephALEXin 250 MG capsule Commonly known as:  KEFLEX Take 250 mg by mouth daily before lunch.   D3 HIGH POTENCY 1000 units capsule Generic drug:  Cholecalciferol Take 1,000 Units by mouth daily. Take one tablet daily   docusate sodium 100 MG capsule Commonly known as:  COLACE Take 200 mg by mouth daily.   Fish Oil 1200 MG Caps Take 2 capsules by mouth daily. Take one tablet twice daily   fluocinolone 0.01 % cream Commonly known as:  VANOS Apply 1 application topically 2 (two) times daily as needed (skin rash).   Magnesium 400 MG Caps Take 1 tablet by mouth daily. Take one tablet daily   MYRBETRIQ 50 MG Tb24 tablet Generic drug:  mirabegron ER Take 50 mg by mouth daily.   polyethylene glycol packet Commonly known as:  MIRALAX / GLYCOLAX Take 17 g by mouth daily.   raloxifene 60 MG tablet Commonly known as:  EVISTA Take 1 tablet (60 mg total) by mouth daily. Take one table daily   vitamin B-12 1000 MCG tablet Commonly known as:  CYANOCOBALAMIN Take 1,000 mcg by mouth every other day.       Review of Systems  Constitutional: Negative for activity change, appetite change, diaphoresis, fever and unexpected weight change.  HENT: Positive for ear pain. Negative for congestion, dental problem, ear discharge, facial swelling, hearing loss, mouth sores, nosebleeds, postnasal drip, rhinorrhea, sinus pressure, sneezing, sore throat and tinnitus.   Eyes: Negative for photophobia, pain, discharge, redness, itching and visual disturbance.  Respiratory: Negative for cough and shortness of breath.   Cardiovascular: Negative for chest pain.  Neurological: Negative for dizziness, facial asymmetry, light-headedness, numbness and headaches.  Psychiatric/Behavioral: Negative for  agitation and behavioral problems.    Immunization History  Administered Date(s) Administered  . Influenza-Unspecified 06/18/2015   Pertinent  Health Maintenance Due  Topic Date Due  . DEXA SCAN  07/20/1996  . PNA vac Low Risk Adult (1 of 2 - PCV13) 08/29/2018 (Originally 07/20/1996)  . INFLUENZA VACCINE  03/29/2016   Fall Risk  11/25/2015 04/29/2015  Falls in the past year? No Yes  Number falls in past yr: - 1  Injury with Fall? - Yes   Functional Status Survey:    Vitals:   03/24/16 1512  BP: 110/72  Pulse: 91  Resp: 16  Temp: 97.6 F (36.4 C)   There is no height or weight on file to calculate BMI. Physical Exam  Constitutional: No distress.  HENT:  Head: Normocephalic and atraumatic.  Right Ear: External ear normal. No lacerations. No drainage, swelling  or tenderness. No foreign bodies. No mastoid tenderness. Tympanic membrane is retracted. Tympanic membrane is not injected, not perforated, not erythematous and not bulging. A middle ear effusion is present. No decreased hearing is noted.  Left Ear: External ear normal. No drainage, swelling or tenderness. No decreased hearing is noted.  Nose: Nose normal.  Mouth/Throat: Oropharynx is clear and moist. No oropharyngeal exudate.  Eyes: Conjunctivae and EOM are normal. Pupils are equal, round, and reactive to light. Right eye exhibits no discharge. Left eye exhibits no discharge.  Neck: Normal range of motion. Neck supple. No JVD present. No tracheal deviation present. No thyromegaly present.  Cardiovascular: Normal rate and regular rhythm.   Pulmonary/Chest: Effort normal and breath sounds normal.  Abdominal: Soft. Bowel sounds are normal.  Lymphadenopathy:    She has no cervical adenopathy.  Skin: She is not diaphoretic.    Labs reviewed:  Recent Labs  11/20/15 01/28/16  NA 138 140  K 4.3 4.4  BUN 33* 26*  CREATININE 1.5* 1.2*    Recent Labs  11/20/15  AST 22  ALT 8  ALKPHOS 39    Recent Labs   06/24/15 0815 11/20/15 01/28/16 02/01/16 0500  WBC 6.6 4.2 6.7 5.3  HGB 12.8 13.5 11.8* 12.5  HCT 40.1 39 37 42  MCV 95.9  --   --   --   PLT 171 176 134* 184   No results found for: TSH No results found for: HGBA1C No results found for: CHOL, HDL, LDLCALC, LDLDIRECT, TRIG, CHOLHDL  Significant Diagnostic Results in last 30 days:  No results found.  Assessment/Plan  1. Otitis media with effusion, right No erythema or drainage Maintained on keflex for UTI prevention Afrin 2 sprays each nare BID for 3 days Make attempts to pop ears with spray admin to open eustachian tube Report if no improvement  Would avoid nsaids due to CKD Would like to avoid decongestant this point    Family/ staff Communication: discussed with resident and staff  Labs/tests ordered:  NA

## 2016-03-25 DIAGNOSIS — Z7389 Other problems related to life management difficulty: Secondary | ICD-10-CM | POA: Diagnosis not present

## 2016-03-25 DIAGNOSIS — R1031 Right lower quadrant pain: Secondary | ICD-10-CM | POA: Diagnosis not present

## 2016-03-25 DIAGNOSIS — R488 Other symbolic dysfunctions: Secondary | ICD-10-CM | POA: Diagnosis not present

## 2016-03-25 DIAGNOSIS — R2689 Other abnormalities of gait and mobility: Secondary | ICD-10-CM | POA: Diagnosis not present

## 2016-03-25 DIAGNOSIS — M6281 Muscle weakness (generalized): Secondary | ICD-10-CM | POA: Diagnosis not present

## 2016-03-25 DIAGNOSIS — R278 Other lack of coordination: Secondary | ICD-10-CM | POA: Diagnosis not present

## 2016-03-28 DIAGNOSIS — R2689 Other abnormalities of gait and mobility: Secondary | ICD-10-CM | POA: Diagnosis not present

## 2016-03-28 DIAGNOSIS — M6281 Muscle weakness (generalized): Secondary | ICD-10-CM | POA: Diagnosis not present

## 2016-03-28 DIAGNOSIS — R278 Other lack of coordination: Secondary | ICD-10-CM | POA: Diagnosis not present

## 2016-03-28 DIAGNOSIS — Z7389 Other problems related to life management difficulty: Secondary | ICD-10-CM | POA: Diagnosis not present

## 2016-03-28 DIAGNOSIS — R488 Other symbolic dysfunctions: Secondary | ICD-10-CM | POA: Diagnosis not present

## 2016-03-28 DIAGNOSIS — R1031 Right lower quadrant pain: Secondary | ICD-10-CM | POA: Diagnosis not present

## 2016-03-29 DIAGNOSIS — M6281 Muscle weakness (generalized): Secondary | ICD-10-CM | POA: Diagnosis not present

## 2016-03-29 DIAGNOSIS — Z7389 Other problems related to life management difficulty: Secondary | ICD-10-CM | POA: Diagnosis not present

## 2016-03-29 DIAGNOSIS — Z9181 History of falling: Secondary | ICD-10-CM | POA: Diagnosis not present

## 2016-03-29 DIAGNOSIS — M25551 Pain in right hip: Secondary | ICD-10-CM | POA: Diagnosis not present

## 2016-03-29 DIAGNOSIS — R1031 Right lower quadrant pain: Secondary | ICD-10-CM | POA: Diagnosis not present

## 2016-03-29 DIAGNOSIS — R278 Other lack of coordination: Secondary | ICD-10-CM | POA: Diagnosis not present

## 2016-03-29 DIAGNOSIS — Z4789 Encounter for other orthopedic aftercare: Secondary | ICD-10-CM | POA: Diagnosis not present

## 2016-03-29 DIAGNOSIS — G6289 Other specified polyneuropathies: Secondary | ICD-10-CM | POA: Diagnosis not present

## 2016-03-29 DIAGNOSIS — R2689 Other abnormalities of gait and mobility: Secondary | ICD-10-CM | POA: Diagnosis not present

## 2016-03-29 DIAGNOSIS — M84451S Pathological fracture, right femur, sequela: Secondary | ICD-10-CM | POA: Diagnosis not present

## 2016-03-29 DIAGNOSIS — R102 Pelvic and perineal pain: Secondary | ICD-10-CM | POA: Diagnosis not present

## 2016-03-30 DIAGNOSIS — R2689 Other abnormalities of gait and mobility: Secondary | ICD-10-CM | POA: Diagnosis not present

## 2016-03-30 DIAGNOSIS — R1031 Right lower quadrant pain: Secondary | ICD-10-CM | POA: Diagnosis not present

## 2016-03-30 DIAGNOSIS — M6281 Muscle weakness (generalized): Secondary | ICD-10-CM | POA: Diagnosis not present

## 2016-03-30 DIAGNOSIS — Z7389 Other problems related to life management difficulty: Secondary | ICD-10-CM | POA: Diagnosis not present

## 2016-03-30 DIAGNOSIS — R278 Other lack of coordination: Secondary | ICD-10-CM | POA: Diagnosis not present

## 2016-03-30 DIAGNOSIS — G6289 Other specified polyneuropathies: Secondary | ICD-10-CM | POA: Diagnosis not present

## 2016-03-31 DIAGNOSIS — R2689 Other abnormalities of gait and mobility: Secondary | ICD-10-CM | POA: Diagnosis not present

## 2016-03-31 DIAGNOSIS — M6281 Muscle weakness (generalized): Secondary | ICD-10-CM | POA: Diagnosis not present

## 2016-03-31 DIAGNOSIS — R278 Other lack of coordination: Secondary | ICD-10-CM | POA: Diagnosis not present

## 2016-03-31 DIAGNOSIS — Z7389 Other problems related to life management difficulty: Secondary | ICD-10-CM | POA: Diagnosis not present

## 2016-03-31 DIAGNOSIS — R1031 Right lower quadrant pain: Secondary | ICD-10-CM | POA: Diagnosis not present

## 2016-03-31 DIAGNOSIS — G6289 Other specified polyneuropathies: Secondary | ICD-10-CM | POA: Diagnosis not present

## 2016-04-01 DIAGNOSIS — M6281 Muscle weakness (generalized): Secondary | ICD-10-CM | POA: Diagnosis not present

## 2016-04-01 DIAGNOSIS — R2689 Other abnormalities of gait and mobility: Secondary | ICD-10-CM | POA: Diagnosis not present

## 2016-04-01 DIAGNOSIS — Z7389 Other problems related to life management difficulty: Secondary | ICD-10-CM | POA: Diagnosis not present

## 2016-04-01 DIAGNOSIS — G6289 Other specified polyneuropathies: Secondary | ICD-10-CM | POA: Diagnosis not present

## 2016-04-01 DIAGNOSIS — R278 Other lack of coordination: Secondary | ICD-10-CM | POA: Diagnosis not present

## 2016-04-01 DIAGNOSIS — R1031 Right lower quadrant pain: Secondary | ICD-10-CM | POA: Diagnosis not present

## 2016-04-04 DIAGNOSIS — R2689 Other abnormalities of gait and mobility: Secondary | ICD-10-CM | POA: Diagnosis not present

## 2016-04-04 DIAGNOSIS — R278 Other lack of coordination: Secondary | ICD-10-CM | POA: Diagnosis not present

## 2016-04-04 DIAGNOSIS — Z7389 Other problems related to life management difficulty: Secondary | ICD-10-CM | POA: Diagnosis not present

## 2016-04-04 DIAGNOSIS — M6281 Muscle weakness (generalized): Secondary | ICD-10-CM | POA: Diagnosis not present

## 2016-04-04 DIAGNOSIS — G6289 Other specified polyneuropathies: Secondary | ICD-10-CM | POA: Diagnosis not present

## 2016-04-04 DIAGNOSIS — R1031 Right lower quadrant pain: Secondary | ICD-10-CM | POA: Diagnosis not present

## 2016-04-05 DIAGNOSIS — R2689 Other abnormalities of gait and mobility: Secondary | ICD-10-CM | POA: Diagnosis not present

## 2016-04-05 DIAGNOSIS — G6289 Other specified polyneuropathies: Secondary | ICD-10-CM | POA: Diagnosis not present

## 2016-04-05 DIAGNOSIS — R1031 Right lower quadrant pain: Secondary | ICD-10-CM | POA: Diagnosis not present

## 2016-04-05 DIAGNOSIS — M6281 Muscle weakness (generalized): Secondary | ICD-10-CM | POA: Diagnosis not present

## 2016-04-05 DIAGNOSIS — R278 Other lack of coordination: Secondary | ICD-10-CM | POA: Diagnosis not present

## 2016-04-05 DIAGNOSIS — Z7389 Other problems related to life management difficulty: Secondary | ICD-10-CM | POA: Diagnosis not present

## 2016-04-06 DIAGNOSIS — R2689 Other abnormalities of gait and mobility: Secondary | ICD-10-CM | POA: Diagnosis not present

## 2016-04-06 DIAGNOSIS — R1031 Right lower quadrant pain: Secondary | ICD-10-CM | POA: Diagnosis not present

## 2016-04-06 DIAGNOSIS — G6289 Other specified polyneuropathies: Secondary | ICD-10-CM | POA: Diagnosis not present

## 2016-04-06 DIAGNOSIS — M6281 Muscle weakness (generalized): Secondary | ICD-10-CM | POA: Diagnosis not present

## 2016-04-06 DIAGNOSIS — R278 Other lack of coordination: Secondary | ICD-10-CM | POA: Diagnosis not present

## 2016-04-06 DIAGNOSIS — Z7389 Other problems related to life management difficulty: Secondary | ICD-10-CM | POA: Diagnosis not present

## 2016-04-07 DIAGNOSIS — M6281 Muscle weakness (generalized): Secondary | ICD-10-CM | POA: Diagnosis not present

## 2016-04-07 DIAGNOSIS — G6289 Other specified polyneuropathies: Secondary | ICD-10-CM | POA: Diagnosis not present

## 2016-04-07 DIAGNOSIS — R2689 Other abnormalities of gait and mobility: Secondary | ICD-10-CM | POA: Diagnosis not present

## 2016-04-07 DIAGNOSIS — R1031 Right lower quadrant pain: Secondary | ICD-10-CM | POA: Diagnosis not present

## 2016-04-07 DIAGNOSIS — R278 Other lack of coordination: Secondary | ICD-10-CM | POA: Diagnosis not present

## 2016-04-07 DIAGNOSIS — Z7389 Other problems related to life management difficulty: Secondary | ICD-10-CM | POA: Diagnosis not present

## 2016-04-08 DIAGNOSIS — R2689 Other abnormalities of gait and mobility: Secondary | ICD-10-CM | POA: Diagnosis not present

## 2016-04-08 DIAGNOSIS — Z7389 Other problems related to life management difficulty: Secondary | ICD-10-CM | POA: Diagnosis not present

## 2016-04-08 DIAGNOSIS — R278 Other lack of coordination: Secondary | ICD-10-CM | POA: Diagnosis not present

## 2016-04-08 DIAGNOSIS — R1031 Right lower quadrant pain: Secondary | ICD-10-CM | POA: Diagnosis not present

## 2016-04-08 DIAGNOSIS — G6289 Other specified polyneuropathies: Secondary | ICD-10-CM | POA: Diagnosis not present

## 2016-04-08 DIAGNOSIS — M6281 Muscle weakness (generalized): Secondary | ICD-10-CM | POA: Diagnosis not present

## 2016-04-11 DIAGNOSIS — M6281 Muscle weakness (generalized): Secondary | ICD-10-CM | POA: Diagnosis not present

## 2016-04-11 DIAGNOSIS — R278 Other lack of coordination: Secondary | ICD-10-CM | POA: Diagnosis not present

## 2016-04-11 DIAGNOSIS — R2689 Other abnormalities of gait and mobility: Secondary | ICD-10-CM | POA: Diagnosis not present

## 2016-04-11 DIAGNOSIS — G6289 Other specified polyneuropathies: Secondary | ICD-10-CM | POA: Diagnosis not present

## 2016-04-11 DIAGNOSIS — R1031 Right lower quadrant pain: Secondary | ICD-10-CM | POA: Diagnosis not present

## 2016-04-11 DIAGNOSIS — Z7389 Other problems related to life management difficulty: Secondary | ICD-10-CM | POA: Diagnosis not present

## 2016-04-13 DIAGNOSIS — Z7389 Other problems related to life management difficulty: Secondary | ICD-10-CM | POA: Diagnosis not present

## 2016-04-13 DIAGNOSIS — M6281 Muscle weakness (generalized): Secondary | ICD-10-CM | POA: Diagnosis not present

## 2016-04-13 DIAGNOSIS — G6289 Other specified polyneuropathies: Secondary | ICD-10-CM | POA: Diagnosis not present

## 2016-04-13 DIAGNOSIS — R278 Other lack of coordination: Secondary | ICD-10-CM | POA: Diagnosis not present

## 2016-04-13 DIAGNOSIS — R1031 Right lower quadrant pain: Secondary | ICD-10-CM | POA: Diagnosis not present

## 2016-04-13 DIAGNOSIS — R2689 Other abnormalities of gait and mobility: Secondary | ICD-10-CM | POA: Diagnosis not present

## 2016-04-15 DIAGNOSIS — M6281 Muscle weakness (generalized): Secondary | ICD-10-CM | POA: Diagnosis not present

## 2016-04-15 DIAGNOSIS — H5203 Hypermetropia, bilateral: Secondary | ICD-10-CM | POA: Diagnosis not present

## 2016-04-15 DIAGNOSIS — R1031 Right lower quadrant pain: Secondary | ICD-10-CM | POA: Diagnosis not present

## 2016-04-15 DIAGNOSIS — H52223 Regular astigmatism, bilateral: Secondary | ICD-10-CM | POA: Diagnosis not present

## 2016-04-15 DIAGNOSIS — R278 Other lack of coordination: Secondary | ICD-10-CM | POA: Diagnosis not present

## 2016-04-15 DIAGNOSIS — Z7389 Other problems related to life management difficulty: Secondary | ICD-10-CM | POA: Diagnosis not present

## 2016-04-15 DIAGNOSIS — H2513 Age-related nuclear cataract, bilateral: Secondary | ICD-10-CM | POA: Diagnosis not present

## 2016-04-15 DIAGNOSIS — G6289 Other specified polyneuropathies: Secondary | ICD-10-CM | POA: Diagnosis not present

## 2016-04-15 DIAGNOSIS — H524 Presbyopia: Secondary | ICD-10-CM | POA: Diagnosis not present

## 2016-04-15 DIAGNOSIS — R2689 Other abnormalities of gait and mobility: Secondary | ICD-10-CM | POA: Diagnosis not present

## 2016-04-18 DIAGNOSIS — G6289 Other specified polyneuropathies: Secondary | ICD-10-CM | POA: Diagnosis not present

## 2016-04-18 DIAGNOSIS — R2689 Other abnormalities of gait and mobility: Secondary | ICD-10-CM | POA: Diagnosis not present

## 2016-04-18 DIAGNOSIS — R1031 Right lower quadrant pain: Secondary | ICD-10-CM | POA: Diagnosis not present

## 2016-04-18 DIAGNOSIS — R278 Other lack of coordination: Secondary | ICD-10-CM | POA: Diagnosis not present

## 2016-04-18 DIAGNOSIS — Z7389 Other problems related to life management difficulty: Secondary | ICD-10-CM | POA: Diagnosis not present

## 2016-04-18 DIAGNOSIS — M6281 Muscle weakness (generalized): Secondary | ICD-10-CM | POA: Diagnosis not present

## 2016-04-20 DIAGNOSIS — R1031 Right lower quadrant pain: Secondary | ICD-10-CM | POA: Diagnosis not present

## 2016-04-20 DIAGNOSIS — R2689 Other abnormalities of gait and mobility: Secondary | ICD-10-CM | POA: Diagnosis not present

## 2016-04-20 DIAGNOSIS — G6289 Other specified polyneuropathies: Secondary | ICD-10-CM | POA: Diagnosis not present

## 2016-04-20 DIAGNOSIS — R278 Other lack of coordination: Secondary | ICD-10-CM | POA: Diagnosis not present

## 2016-04-20 DIAGNOSIS — Z7389 Other problems related to life management difficulty: Secondary | ICD-10-CM | POA: Diagnosis not present

## 2016-04-20 DIAGNOSIS — M6281 Muscle weakness (generalized): Secondary | ICD-10-CM | POA: Diagnosis not present

## 2016-04-21 DIAGNOSIS — M6281 Muscle weakness (generalized): Secondary | ICD-10-CM | POA: Diagnosis not present

## 2016-04-21 DIAGNOSIS — S72001D Fracture of unspecified part of neck of right femur, subsequent encounter for closed fracture with routine healing: Secondary | ICD-10-CM | POA: Diagnosis not present

## 2016-04-21 DIAGNOSIS — R2689 Other abnormalities of gait and mobility: Secondary | ICD-10-CM | POA: Diagnosis not present

## 2016-04-21 DIAGNOSIS — R1031 Right lower quadrant pain: Secondary | ICD-10-CM | POA: Diagnosis not present

## 2016-04-21 DIAGNOSIS — G6289 Other specified polyneuropathies: Secondary | ICD-10-CM | POA: Diagnosis not present

## 2016-04-21 DIAGNOSIS — R278 Other lack of coordination: Secondary | ICD-10-CM | POA: Diagnosis not present

## 2016-04-21 DIAGNOSIS — Z7389 Other problems related to life management difficulty: Secondary | ICD-10-CM | POA: Diagnosis not present

## 2016-04-22 DIAGNOSIS — R278 Other lack of coordination: Secondary | ICD-10-CM | POA: Diagnosis not present

## 2016-04-22 DIAGNOSIS — Z7389 Other problems related to life management difficulty: Secondary | ICD-10-CM | POA: Diagnosis not present

## 2016-04-22 DIAGNOSIS — R1031 Right lower quadrant pain: Secondary | ICD-10-CM | POA: Diagnosis not present

## 2016-04-22 DIAGNOSIS — R2689 Other abnormalities of gait and mobility: Secondary | ICD-10-CM | POA: Diagnosis not present

## 2016-04-22 DIAGNOSIS — G6289 Other specified polyneuropathies: Secondary | ICD-10-CM | POA: Diagnosis not present

## 2016-04-22 DIAGNOSIS — M6281 Muscle weakness (generalized): Secondary | ICD-10-CM | POA: Diagnosis not present

## 2016-04-25 DIAGNOSIS — Z7389 Other problems related to life management difficulty: Secondary | ICD-10-CM | POA: Diagnosis not present

## 2016-04-25 DIAGNOSIS — M6281 Muscle weakness (generalized): Secondary | ICD-10-CM | POA: Diagnosis not present

## 2016-04-25 DIAGNOSIS — R1031 Right lower quadrant pain: Secondary | ICD-10-CM | POA: Diagnosis not present

## 2016-04-25 DIAGNOSIS — R278 Other lack of coordination: Secondary | ICD-10-CM | POA: Diagnosis not present

## 2016-04-25 DIAGNOSIS — G6289 Other specified polyneuropathies: Secondary | ICD-10-CM | POA: Diagnosis not present

## 2016-04-25 DIAGNOSIS — R2689 Other abnormalities of gait and mobility: Secondary | ICD-10-CM | POA: Diagnosis not present

## 2016-04-27 ENCOUNTER — Encounter: Payer: Self-pay | Admitting: Internal Medicine

## 2016-04-27 ENCOUNTER — Non-Acute Institutional Stay: Payer: Medicare Other | Admitting: Internal Medicine

## 2016-04-27 VITALS — BP 128/68 | HR 80 | Temp 97.7°F | Ht 60.0 in | Wt 107.0 lb

## 2016-04-27 DIAGNOSIS — G6289 Other specified polyneuropathies: Secondary | ICD-10-CM

## 2016-04-27 DIAGNOSIS — F0390 Unspecified dementia without behavioral disturbance: Secondary | ICD-10-CM

## 2016-04-27 DIAGNOSIS — R2689 Other abnormalities of gait and mobility: Secondary | ICD-10-CM | POA: Diagnosis not present

## 2016-04-27 DIAGNOSIS — F429 Obsessive-compulsive disorder, unspecified: Secondary | ICD-10-CM

## 2016-04-27 DIAGNOSIS — M6281 Muscle weakness (generalized): Secondary | ICD-10-CM | POA: Diagnosis not present

## 2016-04-27 DIAGNOSIS — R42 Dizziness and giddiness: Secondary | ICD-10-CM | POA: Diagnosis not present

## 2016-04-27 DIAGNOSIS — M810 Age-related osteoporosis without current pathological fracture: Secondary | ICD-10-CM | POA: Diagnosis not present

## 2016-04-27 DIAGNOSIS — R1031 Right lower quadrant pain: Secondary | ICD-10-CM | POA: Diagnosis not present

## 2016-04-27 DIAGNOSIS — F32A Depression, unspecified: Secondary | ICD-10-CM

## 2016-04-27 DIAGNOSIS — R2681 Unsteadiness on feet: Secondary | ICD-10-CM | POA: Diagnosis not present

## 2016-04-27 DIAGNOSIS — F329 Major depressive disorder, single episode, unspecified: Secondary | ICD-10-CM

## 2016-04-27 DIAGNOSIS — N3281 Overactive bladder: Secondary | ICD-10-CM | POA: Diagnosis not present

## 2016-04-27 DIAGNOSIS — Z7389 Other problems related to life management difficulty: Secondary | ICD-10-CM | POA: Diagnosis not present

## 2016-04-27 DIAGNOSIS — R278 Other lack of coordination: Secondary | ICD-10-CM | POA: Diagnosis not present

## 2016-04-27 NOTE — Progress Notes (Signed)
Location:   [place] of Service:  Clinic (12)  Provider: Laycie Schriner L. Mariea Clonts, D.O., C.M.D.  Code Status: DNR Goals of Care:  Advanced Directives 04/27/2016  Does patient have an advance directive? Yes  Type of Advance Directive Out of facility DNR (pink MOST or yellow form);Healthcare Power of Attorney  Does patient want to make changes to advanced directive? -  Copy of advanced directive(s) in chart? -  Would patient like information on creating an advanced directive? -  Pre-existing out of facility DNR order (yellow form or pink MOST form) -     Chief Complaint  Patient presents with  . Medical Management of Chronic Issues    several concerns    HPI: Patient is a 82 y.o. female seen today for medical management of chronic diseases.    I was asked to see Melanie Freeman about several concerns since her move to [place].  She had been in rehab with her hip fracture.  She's recovered well from it.    She is spending a lot of time in her room and moping and obsessing over medical concerns, the fact that she cannot drive any longer (her daughter has taken her car to their home), and being dizzy.  She says her eyes are burning, she has headaches and she's dizzy.  She apparently has just gotten a new prescription and her glasses have not yet come in.  She had some temporary relief from her symptoms just by seeing the ophthalmologist at first so her daughter expects the symptoms to resolve with the glasses.  We discussed that if they do not, she should get BPPV therapy from PT here.  She also lives in fear that she is going to get macular degeneration b/c her mother had it.  She is using systane drops for the burning eyes.    She is not eating well--she's been ordering things to her room (desserts primarily and fruit).  She claims this is due to the dizziness and is able to tell me a few instances where she has gone to activities or the dining room (likely rare, but she has done some  of this).    Her daughter notes she might be overall a little better since citalopram was started.  Dose will go up next week.  Past Medical History:  Diagnosis Date  . Allergic contact dermatitis   . Arthritis   . Closed fracture of neck of femur ([place]) 01/25/2016  . Cystitis, interstitial   . Femur fracture, right ([place]) 01/26/16  . Fracture of right inferior pubic ramus ([place]) 01/26/16  . Hepatitis    over 50 years ago  . Long-term use of high-risk medication   . Osteopenia   . Tremors of nervous system   . Vertigo     Past Surgical History:  Procedure Laterality Date  . APPENDECTOMY    . DILATATION & CURETTAGE/HYSTEROSCOPY WITH MYOSURE N/A 06/26/2015   Procedure: DILATATION & CURETTAGE/HYSTEROSCOPY WITH MYOSURE;  Surgeon: Arvella Nigh, MD;  Location: [facility];  Service: Gynecology;  Laterality: N/A;  . FRACTURE SURGERY  2015   left hip fracture  . HIP ORIF W/ CAPSULOTOMY Left 10/22/2013   [name]   . RIGHT hip percutaneous pinning of impacted femoral neck fracture  01/26/16   [facility]--Dr. Jaynee Eagles orthopedics  . TONSILLECTOMY      Allergies  Allergen Reactions  . Ham Other (See Comments)  . Latex Rash  . Other Other (See Comments)  Pt reports was seen by neurologist for tremors and was allergic to all medications prescribed for tremors, but does not know names or reaction. Patient reports being allergic to polyester. Does not know what reaction would be. States she was seen by a doctor who tested her for multiple allergies and she had positive results.  . Ciprofloxacin Other (See Comments)    Doesn't work  . Doxycycline Other (See Comments)    unknown  . Erythromycin     Noted from Neurologist in [place]  . Hydrocortisone   . Levaquin [Levofloxacin In D5w] Other (See Comments)    unknown  . Lopid [Gemfibrozil]   . Macrobid [employer] Macro] Other (See Comments)    Doesn't work  . Sulfa Antibiotics Other (See Comments)    unknown        Medication List       Accurate as of 04/27/16  4:12 PM. Always use your most recent med list.          acetaminophen 325 MG tablet Commonly known as:  TYLENOL Take 650 mg by mouth every 6 (six) hours as needed (do not exceed 3 gm of APAP in 24 hours including tylenol).   CALCIUM 600+D3 600-800 MG-UNIT Tabs Generic drug:  Calcium Carb-Cholecalciferol Take 1 tablet by mouth daily. Take one tablet daily   cephALEXin 250 MG capsule Commonly known as:  KEFLEX Take 250 mg by mouth daily before lunch.   citalopram 20 MG tablet Commonly known as:  CELEXA Take 20 mg by mouth daily.   D3 HIGH POTENCY 1000 units capsule Generic drug:  Cholecalciferol Take 1,000 Units by mouth daily. Take one tablet daily   docusate sodium 100 MG capsule Commonly known as:  COLACE Take 200 mg by mouth daily.   Fish Oil 1200 MG Caps Take 2 capsules by mouth daily. Take one tablet twice daily   fluocinolone 0.01 % cream Commonly known as:  VANOS Apply 1 application topically 2 (two) times daily as needed (skin rash).   Magnesium 400 MG Caps Take 1 tablet by mouth daily. Take one tablet daily   MYRBETRIQ 50 MG Tb24 tablet Generic drug:  mirabegron ER Take 50 mg by mouth daily.   polyethylene glycol packet Commonly known as:  MIRALAX / GLYCOLAX Take 17 g by mouth daily.   raloxifene 60 MG tablet Commonly known as:  EVISTA Take 1 tablet (60 mg total) by mouth daily. Take one table daily   SYSTANE OP Place 1 drop into both eyes 4 (four) times daily.   vitamin B-12 1000 MCG tablet Commonly known as:  CYANOCOBALAMIN Take 1,000 mcg by mouth every other day.       Review of Systems:  Review of Systems  Constitutional: Negative for chills, fever, malaise/fatigue and weight loss.  HENT: Negative for congestion.   Eyes: Positive for blurred vision.  Respiratory: Negative for cough and shortness of breath.   Cardiovascular: Negative for chest pain, palpitations and leg swelling.   Gastrointestinal: Negative for abdominal pain, blood in stool, constipation, diarrhea and melena.  Genitourinary: Positive for frequency and urgency. Negative for dysuria.       Has frequent UTIs and on prophylaxis through urology  Musculoskeletal: Negative for falls, joint pain and myalgias.  Skin: Negative for itching and rash.  Neurological: Positive for headaches. Negative for dizziness, loss of consciousness and weakness.  Endo/Heme/Allergies: Bruises/bleeds easily.  Psychiatric/Behavioral: Positive for depression and memory loss. Negative for hallucinations. The patient is nervous/anxious. The patient does not have  insomnia.     Health Maintenance  Topic Date Due  . DEXA SCAN  07/20/1996  . INFLUENZA VACCINE  03/29/2016  . ZOSTAVAX  08/29/2018 (Originally 07/21/1991)  . TETANUS/TDAP  08/29/2018 (Originally [birth year]20/1948)  . PNA vac Low Risk Adult (1 of 2 - PCV13) 08/29/2018 (Originally 07/20/1996)    Physical Exam: Vitals:   04/27/16 1453  BP: 128/68  Pulse: 80  Temp: 97.7 F (36.5 C)  TempSrc: Oral  SpO2: 95%  Weight: 107 lb (48.5 kg)  Height: 5' (1.524 m)   Body mass index is 20.9 kg/m. Physical Exam  Constitutional:  Thin white female walks with walker  Eyes:  glasses  Cardiovascular: Normal rate, regular rhythm, normal heart sounds and intact distal pulses.   Pulmonary/Chest: Effort normal and breath sounds normal. No respiratory distress.  Abdominal: Soft. Bowel sounds are normal.  Musculoskeletal: Normal range of motion.  Neurological: She is alert.  Skin: Skin is warm and dry. There is pallor.    Labs reviewed: Basic Metabolic Panel:  Recent Labs  11/20/15 01/28/16 03/04/16 0400  NA 138 140  --   K 4.3 4.4  --   BUN 33* 26*  --   CREATININE 1.5* 1.2*  --   TSH  --   --  2.19   Liver Function Tests:  Recent Labs  11/20/15  AST 22  ALT 8  ALKPHOS 39   No results for input(s): LIPASE, AMYLASE in the last 8760 hours. No results for input(s):  AMMONIA in the last 8760 hours. CBC:  Recent Labs  06/24/15 0815 11/20/15 01/28/16 02/01/16 0500  WBC 6.6 4.2 6.7 5.3  HGB 12.8 13.5 11.8* 12.5  HCT 40.1 39 37 42  MCV 95.9  --   --   --   PLT 171 176 134* 184    Assessment/Plan 1. Depression -cont celexa which is well tolerated--will go up to next dose soon  2. OCD (obsessive compulsive disorder) -hoping celexa will help this when titrated to next dose  3. Unsteady gait -cont use of walker at all times  4. Overactive bladder -cont myrbetriq   5. Senile osteoporosis -continues on raloxifene and vitamin D3 and ca with D, doing some walking, but needs to do more weightbearing exercise  6. Dementia, without behavioral disturbance -cognition actually seems a bit better than it was when she lived independently and probably wasn't taking her meds properly -she should not drive  7. Other polyneuropathy ([place]) -cont b12, not on other meds for neuropathy and likely would not tolerate due to already present poor balance  8. Dizziness -suspect this is related to her need for new glasses b/c it seems to be accompanied by headaches and burning of her eyes -if it does not resolve with her new glasses, refer to PT for vestibular rehab  We also discussed having a caregiver to motivate her more to attend activities, but pt seemed opposed and has sometimes been refusing help by OT with her morning routine, but not able to fully shower independently  Labs/tests ordered:   Orders Placed This Encounter  Procedures  . TSH    This external order was created through the Results Console.    Next appt:  06/08/2016  Melanie Petrovic L. Melanie Freeman, D.O. [facility] [address], [place] [address] Cell Phone (Mon-Fri 8am-5pm):  [phone] On Call:  [phone] & follow prompts after 5pm & weekends Office Phone:  [phone] Office Fax:  [phone]

## 2016-04-28 ENCOUNTER — Encounter: Payer: Self-pay | Admitting: Internal Medicine

## 2016-04-28 DIAGNOSIS — F329 Major depressive disorder, single episode, unspecified: Secondary | ICD-10-CM | POA: Insufficient documentation

## 2016-04-28 DIAGNOSIS — F32A Depression, unspecified: Secondary | ICD-10-CM | POA: Insufficient documentation

## 2016-04-29 DIAGNOSIS — Z4789 Encounter for other orthopedic aftercare: Secondary | ICD-10-CM | POA: Diagnosis not present

## 2016-04-29 DIAGNOSIS — R102 Pelvic and perineal pain: Secondary | ICD-10-CM | POA: Diagnosis not present

## 2016-04-29 DIAGNOSIS — M25551 Pain in right hip: Secondary | ICD-10-CM | POA: Diagnosis not present

## 2016-04-29 DIAGNOSIS — M6281 Muscle weakness (generalized): Secondary | ICD-10-CM | POA: Diagnosis not present

## 2016-04-29 DIAGNOSIS — Z7389 Other problems related to life management difficulty: Secondary | ICD-10-CM | POA: Diagnosis not present

## 2016-04-29 DIAGNOSIS — R1031 Right lower quadrant pain: Secondary | ICD-10-CM | POA: Diagnosis not present

## 2016-04-29 DIAGNOSIS — Z9181 History of falling: Secondary | ICD-10-CM | POA: Diagnosis not present

## 2016-05-02 DIAGNOSIS — R1031 Right lower quadrant pain: Secondary | ICD-10-CM | POA: Diagnosis not present

## 2016-05-02 DIAGNOSIS — M25551 Pain in right hip: Secondary | ICD-10-CM | POA: Diagnosis not present

## 2016-05-02 DIAGNOSIS — M6281 Muscle weakness (generalized): Secondary | ICD-10-CM | POA: Diagnosis not present

## 2016-05-02 DIAGNOSIS — R102 Pelvic and perineal pain: Secondary | ICD-10-CM | POA: Diagnosis not present

## 2016-05-02 DIAGNOSIS — Z7389 Other problems related to life management difficulty: Secondary | ICD-10-CM | POA: Diagnosis not present

## 2016-05-02 DIAGNOSIS — Z9181 History of falling: Secondary | ICD-10-CM | POA: Diagnosis not present

## 2016-05-04 DIAGNOSIS — R102 Pelvic and perineal pain: Secondary | ICD-10-CM | POA: Diagnosis not present

## 2016-05-04 DIAGNOSIS — Z7389 Other problems related to life management difficulty: Secondary | ICD-10-CM | POA: Diagnosis not present

## 2016-05-04 DIAGNOSIS — M6281 Muscle weakness (generalized): Secondary | ICD-10-CM | POA: Diagnosis not present

## 2016-05-04 DIAGNOSIS — R1031 Right lower quadrant pain: Secondary | ICD-10-CM | POA: Diagnosis not present

## 2016-05-04 DIAGNOSIS — M25551 Pain in right hip: Secondary | ICD-10-CM | POA: Diagnosis not present

## 2016-05-04 DIAGNOSIS — Z9181 History of falling: Secondary | ICD-10-CM | POA: Diagnosis not present

## 2016-05-06 DIAGNOSIS — M6281 Muscle weakness (generalized): Secondary | ICD-10-CM | POA: Diagnosis not present

## 2016-05-06 DIAGNOSIS — Z9181 History of falling: Secondary | ICD-10-CM | POA: Diagnosis not present

## 2016-05-06 DIAGNOSIS — R102 Pelvic and perineal pain: Secondary | ICD-10-CM | POA: Diagnosis not present

## 2016-05-06 DIAGNOSIS — Z7389 Other problems related to life management difficulty: Secondary | ICD-10-CM | POA: Diagnosis not present

## 2016-05-06 DIAGNOSIS — M25551 Pain in right hip: Secondary | ICD-10-CM | POA: Diagnosis not present

## 2016-05-06 DIAGNOSIS — R1031 Right lower quadrant pain: Secondary | ICD-10-CM | POA: Diagnosis not present

## 2016-05-09 DIAGNOSIS — Z9181 History of falling: Secondary | ICD-10-CM | POA: Diagnosis not present

## 2016-05-09 DIAGNOSIS — M25551 Pain in right hip: Secondary | ICD-10-CM | POA: Diagnosis not present

## 2016-05-09 DIAGNOSIS — R1031 Right lower quadrant pain: Secondary | ICD-10-CM | POA: Diagnosis not present

## 2016-05-09 DIAGNOSIS — R102 Pelvic and perineal pain: Secondary | ICD-10-CM | POA: Diagnosis not present

## 2016-05-09 DIAGNOSIS — M6281 Muscle weakness (generalized): Secondary | ICD-10-CM | POA: Diagnosis not present

## 2016-05-09 DIAGNOSIS — Z7389 Other problems related to life management difficulty: Secondary | ICD-10-CM | POA: Diagnosis not present

## 2016-05-11 DIAGNOSIS — M25551 Pain in right hip: Secondary | ICD-10-CM | POA: Diagnosis not present

## 2016-05-11 DIAGNOSIS — Z7389 Other problems related to life management difficulty: Secondary | ICD-10-CM | POA: Diagnosis not present

## 2016-05-11 DIAGNOSIS — R1031 Right lower quadrant pain: Secondary | ICD-10-CM | POA: Diagnosis not present

## 2016-05-11 DIAGNOSIS — Z9181 History of falling: Secondary | ICD-10-CM | POA: Diagnosis not present

## 2016-05-11 DIAGNOSIS — M6281 Muscle weakness (generalized): Secondary | ICD-10-CM | POA: Diagnosis not present

## 2016-05-11 DIAGNOSIS — R102 Pelvic and perineal pain: Secondary | ICD-10-CM | POA: Diagnosis not present

## 2016-05-13 DIAGNOSIS — Z7389 Other problems related to life management difficulty: Secondary | ICD-10-CM | POA: Diagnosis not present

## 2016-05-13 DIAGNOSIS — Z9181 History of falling: Secondary | ICD-10-CM | POA: Diagnosis not present

## 2016-05-13 DIAGNOSIS — M25551 Pain in right hip: Secondary | ICD-10-CM | POA: Diagnosis not present

## 2016-05-13 DIAGNOSIS — M6281 Muscle weakness (generalized): Secondary | ICD-10-CM | POA: Diagnosis not present

## 2016-05-13 DIAGNOSIS — R1031 Right lower quadrant pain: Secondary | ICD-10-CM | POA: Diagnosis not present

## 2016-05-13 DIAGNOSIS — R102 Pelvic and perineal pain: Secondary | ICD-10-CM | POA: Diagnosis not present

## 2016-05-16 DIAGNOSIS — R102 Pelvic and perineal pain: Secondary | ICD-10-CM | POA: Diagnosis not present

## 2016-05-16 DIAGNOSIS — Z7389 Other problems related to life management difficulty: Secondary | ICD-10-CM | POA: Diagnosis not present

## 2016-05-16 DIAGNOSIS — Z9181 History of falling: Secondary | ICD-10-CM | POA: Diagnosis not present

## 2016-05-16 DIAGNOSIS — M6281 Muscle weakness (generalized): Secondary | ICD-10-CM | POA: Diagnosis not present

## 2016-05-16 DIAGNOSIS — R1031 Right lower quadrant pain: Secondary | ICD-10-CM | POA: Diagnosis not present

## 2016-05-16 DIAGNOSIS — M25551 Pain in right hip: Secondary | ICD-10-CM | POA: Diagnosis not present

## 2016-05-18 DIAGNOSIS — Z7389 Other problems related to life management difficulty: Secondary | ICD-10-CM | POA: Diagnosis not present

## 2016-05-18 DIAGNOSIS — M25551 Pain in right hip: Secondary | ICD-10-CM | POA: Diagnosis not present

## 2016-05-18 DIAGNOSIS — M6281 Muscle weakness (generalized): Secondary | ICD-10-CM | POA: Diagnosis not present

## 2016-05-18 DIAGNOSIS — Z9181 History of falling: Secondary | ICD-10-CM | POA: Diagnosis not present

## 2016-05-18 DIAGNOSIS — R102 Pelvic and perineal pain: Secondary | ICD-10-CM | POA: Diagnosis not present

## 2016-05-18 DIAGNOSIS — R1031 Right lower quadrant pain: Secondary | ICD-10-CM | POA: Diagnosis not present

## 2016-05-20 DIAGNOSIS — Z7389 Other problems related to life management difficulty: Secondary | ICD-10-CM | POA: Diagnosis not present

## 2016-05-20 DIAGNOSIS — R102 Pelvic and perineal pain: Secondary | ICD-10-CM | POA: Diagnosis not present

## 2016-05-20 DIAGNOSIS — M6281 Muscle weakness (generalized): Secondary | ICD-10-CM | POA: Diagnosis not present

## 2016-05-20 DIAGNOSIS — R1031 Right lower quadrant pain: Secondary | ICD-10-CM | POA: Diagnosis not present

## 2016-05-20 DIAGNOSIS — Z9181 History of falling: Secondary | ICD-10-CM | POA: Diagnosis not present

## 2016-05-20 DIAGNOSIS — M25551 Pain in right hip: Secondary | ICD-10-CM | POA: Diagnosis not present

## 2016-06-08 ENCOUNTER — Non-Acute Institutional Stay: Payer: Medicare Other | Admitting: Internal Medicine

## 2016-06-08 ENCOUNTER — Encounter: Payer: Self-pay | Admitting: Internal Medicine

## 2016-06-08 VITALS — BP 120/70 | HR 77 | Temp 98.1°F | Wt 107.0 lb

## 2016-06-08 DIAGNOSIS — F0281 Dementia in other diseases classified elsewhere with behavioral disturbance: Secondary | ICD-10-CM

## 2016-06-08 DIAGNOSIS — F324 Major depressive disorder, single episode, in partial remission: Secondary | ICD-10-CM | POA: Diagnosis not present

## 2016-06-08 DIAGNOSIS — M6281 Muscle weakness (generalized): Secondary | ICD-10-CM

## 2016-06-08 DIAGNOSIS — G301 Alzheimer's disease with late onset: Secondary | ICD-10-CM

## 2016-06-08 DIAGNOSIS — R1310 Dysphagia, unspecified: Secondary | ICD-10-CM

## 2016-06-08 DIAGNOSIS — F02818 Alzheimer's disease with late onset: Secondary | ICD-10-CM

## 2016-06-08 NOTE — Progress Notes (Signed)
Location:  [facility] of Service:  Clinic (12)  Provider: Quinnley Colasurdo L. Mariea Clonts, D.O., C.M.D.  Code Status: DNR Goals of Care:  Advanced Directives 06/08/2016  Does patient have an advance directive? -  Type of Advance Directive Out of facility DNR (pink MOST or yellow form);Healthcare Power of Attorney  Does patient want to make changes to advanced directive? -  Copy of advanced directive(s) in chart? -  Would patient like information on creating an advanced directive? -  Pre-existing out of facility DNR order (yellow form or pink MOST form) Yellow form placed in chart (order not valid for inpatient use)   Chief Complaint  Patient presents with  . Follow-up    6 week for depression    HPI: Patient is a 82 y.o. female seen today for medical management of chronic diseases.    She says she is having trouble with the pills she is taking.  The pill makes her gag.  Takes one at a time and a sip of water.   She is sitting up to take them.  Denies any difficulty with meat.  Her daughter does report where she choked when they went out to eat while she was in [place].    She is saying she is having headaches.    Takes the celexa now for depression.  Says her mood is ok.  Has been to two music programs.  Only went to exercise once.  C/o weakness getting up out of her chair.  Is eating more continental breakfast now.    Talks about her mother having her 106th bday.    Taking showers herself now.  Had one on the weekend after graduating from OT Friday.     Past Medical History:  Diagnosis Date  . Allergic contact dermatitis   . Arthritis   . Closed fracture of neck of femur ([place]) 01/25/2016  . Cystitis, interstitial   . Femur fracture, right ([place]) 01/26/16  . Fracture of right inferior pubic ramus ([place]) 01/26/16  . Hepatitis    over 50 years ago  . Long-term use of high-risk medication   . Osteopenia   . Tremors of nervous system   . Vertigo     Past Surgical  History:  Procedure Laterality Date  . APPENDECTOMY    . DILATATION & CURETTAGE/HYSTEROSCOPY WITH MYOSURE N/A 06/26/2015   Procedure: DILATATION & CURETTAGE/HYSTEROSCOPY WITH MYOSURE;  Surgeon: Arvella Nigh, MD;  Location: [facility];  Service: Gynecology;  Laterality: N/A;  . FRACTURE SURGERY  2015   left hip fracture  . HIP ORIF W/ CAPSULOTOMY Left 10/22/2013   [name]   . RIGHT hip percutaneous pinning of impacted femoral neck fracture  01/26/16   [facility]--Dr. Jaynee Eagles orthopedics  . TONSILLECTOMY      Allergies  Allergen Reactions  . Ham Other (See Comments)  . Latex Rash  . Other Other (See Comments)    Pt reports was seen by neurologist for tremors and was allergic to all medications prescribed for tremors, but does not know names or reaction. Patient reports being allergic to polyester. Does not know what reaction would be. States she was seen by a doctor who tested her for multiple allergies and she had positive results.  . Ciprofloxacin Other (See Comments)    Doesn't work  . Doxycycline Other (See Comments)    unknown  . Erythromycin     Noted from Neurologist in [place]  . Hydrocortisone   . Levaquin [Levofloxacin In  D5w] Other (See Comments)    unknown  . Lopid [Gemfibrozil]   . Macrobid [employer] Macro] Other (See Comments)    Doesn't work  . Sulfa Antibiotics Other (See Comments)    unknown      Medication List       Accurate as of 06/08/16  4:25 PM. Always use your most recent med list.          acetaminophen 325 MG tablet Commonly known as:  TYLENOL Take 650 mg by mouth every 6 (six) hours as needed (do not exceed 3 gm of APAP in 24 hours including tylenol).   CALCIUM 600+D3 600-800 MG-UNIT Tabs Generic drug:  Calcium Carb-Cholecalciferol Take 1 tablet by mouth daily. Take one tablet daily   cephALEXin 250 MG capsule Commonly known as:  KEFLEX Take 250 mg by mouth daily before lunch.   citalopram 20 MG tablet Commonly known as:   CELEXA Take 20 mg by mouth daily.   D3 HIGH POTENCY 1000 units capsule Generic drug:  Cholecalciferol Take 1,000 Units by mouth daily. Take one tablet daily   docusate sodium 100 MG capsule Commonly known as:  COLACE Take 200 mg by mouth daily.   Fish Oil 1200 MG Caps Take 2 capsules by mouth daily. Take one tablet twice daily   fluocinolone 0.01 % cream Commonly known as:  VANOS Apply 1 application topically 2 (two) times daily as needed (skin rash).   Magnesium 400 MG Caps Take 1 tablet by mouth daily. Take one tablet daily   MYRBETRIQ 50 MG Tb24 tablet Generic drug:  mirabegron ER Take 50 mg by mouth daily.   polyethylene glycol packet Commonly known as:  MIRALAX / GLYCOLAX Take 17 g by mouth daily.   raloxifene 60 MG tablet Commonly known as:  EVISTA Take 1 tablet (60 mg total) by mouth daily. Take one table daily   SYSTANE OP Place 1 drop into both eyes 4 (four) times daily.   vitamin B-12 1000 MCG tablet Commonly known as:  CYANOCOBALAMIN Take 1,000 mcg by mouth every other day.       Review of Systems:  Review of Systems  Constitutional: Negative for chills, fever and malaise/fatigue.  HENT: Positive for hearing loss.   Eyes: Negative for blurred vision.       New glasses  Respiratory: Negative for cough and shortness of breath.   Cardiovascular: Negative for chest pain, palpitations and leg swelling.  Gastrointestinal: Negative for abdominal pain, blood in stool, constipation and melena.  Genitourinary: Positive for frequency and urgency. Negative for dysuria, flank pain and hematuria.  Musculoskeletal: Positive for joint pain. Negative for falls.  Skin: Negative for itching and rash.  Neurological: Negative for dizziness, loss of consciousness, weakness and headaches.       Unsteady gait  Endo/Heme/Allergies: Bruises/bleeds easily.  Psychiatric/Behavioral: Positive for depression and memory loss. The patient is nervous/anxious. The patient does not  have insomnia.     Health Maintenance  Topic Date Due  . DEXA SCAN  07/20/1996  . INFLUENZA VACCINE  03/29/2016  . ZOSTAVAX  08/29/2018 (Originally 07/21/1991)  . TETANUS/TDAP  08/29/2018 (Originally 07/20/1948)  . PNA vac Low Risk Adult (2 of 2 - PPSV23) 08/29/2018 (Originally 09/28/2015)    Physical Exam: Vitals:   06/08/16 1557  BP: 120/70  Pulse: 77  Temp: 98.1 F (36.7 C)  TempSrc: Oral  SpO2: 97%  Weight: 107 lb (48.5 kg)   Body mass index is 20.9 kg/m. Physical Exam  Constitutional:  Thin female,  unsteady on standing and if stops when walking  Eyes:  New glasses  Cardiovascular: Normal rate, regular rhythm, normal heart sounds and intact distal pulses.   Pulmonary/Chest: Effort normal and breath sounds normal. No respiratory distress.  Abdominal: Soft. Bowel sounds are normal.  Musculoskeletal:  Chronic left hip pain  Neurological: She is alert.  Skin: Skin is warm and dry.  Psychiatric: She has a normal mood and affect.  Seems in better spirits and not complaining about as many somatic concerns    Labs reviewed: Basic Metabolic Panel:  Recent Labs  11/20/15 01/28/16 03/04/16 0400  NA 138 140  --   K 4.3 4.4  --   BUN 33* 26*  --   CREATININE 1.5* 1.2*  --   TSH  --   --  2.19   Liver Function Tests:  Recent Labs  11/20/15  AST 22  ALT 8  ALKPHOS 39   No results for input(s): LIPASE, AMYLASE in the last 8760 hours. No results for input(s): AMMONIA in the last 8760 hours. CBC:  Recent Labs  06/24/15 0815 11/20/15 01/28/16 02/01/16 0500  WBC 6.6 4.2 6.7 5.3  HGB 12.8 13.5 11.8* 12.5  HCT 40.1 39 37 42  MCV 95.9  --   --   --   PLT 171 176 134* 184    Assessment/Plan 1. Pill dysphagia -with much encouragement from me and her daughter, Eustaquio Maize, she finally agreed to speech therapy for this difficulty which seems to be becoming more than just pills based on her daughter's history  2. Major depressive disorder with single episode, in  partial remission ([place]) -cont celexa which has helped--she has been participating a little bit more in activities and exercise, fewer somatic complaints and new noted side effects   3. Late onset Alzheimer's disease with behavioral disturbance -today, she was talking about her mother who she seems to think is still living and active -memory loss is gradually progressing -fortunately, she's now in [place] where meds are given to her -DMV form also completed, but family had taken her car home anyway  4. Proximal muscle weakness -is having more difficulty getting up out of a chair, but also was not doing this as instructed by therapy--she notes this primarily in the dining area where she does not have her walker directly in front of her  Labs/tests ordered:  ST eval and tx Next appt:  09/21/2016  Myrtle Haller L. Eliya Geiman, D.O. [facility] [address], [place] [address] Cell Phone (Mon-Fri 8am-5pm):  [phone] On Call:  [phone] & follow prompts after 5pm & weekends Office Phone:  [phone] Office Fax:  [phone]

## 2016-06-16 DIAGNOSIS — Z23 Encounter for immunization: Secondary | ICD-10-CM | POA: Diagnosis not present

## 2016-06-21 DIAGNOSIS — F0391 Unspecified dementia with behavioral disturbance: Secondary | ICD-10-CM | POA: Diagnosis not present

## 2016-06-21 DIAGNOSIS — R1312 Dysphagia, oropharyngeal phase: Secondary | ICD-10-CM | POA: Diagnosis not present

## 2016-06-22 DIAGNOSIS — R1312 Dysphagia, oropharyngeal phase: Secondary | ICD-10-CM | POA: Diagnosis not present

## 2016-06-22 DIAGNOSIS — F0391 Unspecified dementia with behavioral disturbance: Secondary | ICD-10-CM | POA: Diagnosis not present

## 2016-06-23 DIAGNOSIS — R1312 Dysphagia, oropharyngeal phase: Secondary | ICD-10-CM | POA: Diagnosis not present

## 2016-06-23 DIAGNOSIS — F0391 Unspecified dementia with behavioral disturbance: Secondary | ICD-10-CM | POA: Diagnosis not present

## 2016-06-24 DIAGNOSIS — F0391 Unspecified dementia with behavioral disturbance: Secondary | ICD-10-CM | POA: Diagnosis not present

## 2016-06-24 DIAGNOSIS — R1312 Dysphagia, oropharyngeal phase: Secondary | ICD-10-CM | POA: Diagnosis not present

## 2016-07-01 DIAGNOSIS — F0391 Unspecified dementia with behavioral disturbance: Secondary | ICD-10-CM | POA: Diagnosis not present

## 2016-07-01 DIAGNOSIS — R1312 Dysphagia, oropharyngeal phase: Secondary | ICD-10-CM | POA: Diagnosis not present

## 2016-07-04 DIAGNOSIS — R1312 Dysphagia, oropharyngeal phase: Secondary | ICD-10-CM | POA: Diagnosis not present

## 2016-07-04 DIAGNOSIS — F0391 Unspecified dementia with behavioral disturbance: Secondary | ICD-10-CM | POA: Diagnosis not present

## 2016-07-26 DIAGNOSIS — M25531 Pain in right wrist: Secondary | ICD-10-CM | POA: Diagnosis not present

## 2016-08-09 DIAGNOSIS — Z9181 History of falling: Secondary | ICD-10-CM | POA: Diagnosis not present

## 2016-08-09 DIAGNOSIS — R296 Repeated falls: Secondary | ICD-10-CM | POA: Diagnosis not present

## 2016-08-09 DIAGNOSIS — M542 Cervicalgia: Secondary | ICD-10-CM | POA: Diagnosis not present

## 2016-08-09 DIAGNOSIS — R278 Other lack of coordination: Secondary | ICD-10-CM | POA: Diagnosis not present

## 2016-08-10 DIAGNOSIS — Z9181 History of falling: Secondary | ICD-10-CM | POA: Diagnosis not present

## 2016-08-10 DIAGNOSIS — M542 Cervicalgia: Secondary | ICD-10-CM | POA: Diagnosis not present

## 2016-08-10 DIAGNOSIS — R296 Repeated falls: Secondary | ICD-10-CM | POA: Diagnosis not present

## 2016-08-10 DIAGNOSIS — R278 Other lack of coordination: Secondary | ICD-10-CM | POA: Diagnosis not present

## 2016-08-11 DIAGNOSIS — R296 Repeated falls: Secondary | ICD-10-CM | POA: Diagnosis not present

## 2016-08-11 DIAGNOSIS — Z9181 History of falling: Secondary | ICD-10-CM | POA: Diagnosis not present

## 2016-08-11 DIAGNOSIS — R278 Other lack of coordination: Secondary | ICD-10-CM | POA: Diagnosis not present

## 2016-08-11 DIAGNOSIS — M542 Cervicalgia: Secondary | ICD-10-CM | POA: Diagnosis not present

## 2016-08-15 DIAGNOSIS — Z9181 History of falling: Secondary | ICD-10-CM | POA: Diagnosis not present

## 2016-08-15 DIAGNOSIS — R278 Other lack of coordination: Secondary | ICD-10-CM | POA: Diagnosis not present

## 2016-08-15 DIAGNOSIS — R296 Repeated falls: Secondary | ICD-10-CM | POA: Diagnosis not present

## 2016-08-15 DIAGNOSIS — M542 Cervicalgia: Secondary | ICD-10-CM | POA: Diagnosis not present

## 2016-08-16 DIAGNOSIS — Z9181 History of falling: Secondary | ICD-10-CM | POA: Diagnosis not present

## 2016-08-16 DIAGNOSIS — R278 Other lack of coordination: Secondary | ICD-10-CM | POA: Diagnosis not present

## 2016-08-16 DIAGNOSIS — M542 Cervicalgia: Secondary | ICD-10-CM | POA: Diagnosis not present

## 2016-08-16 DIAGNOSIS — R296 Repeated falls: Secondary | ICD-10-CM | POA: Diagnosis not present

## 2016-08-19 DIAGNOSIS — Z9181 History of falling: Secondary | ICD-10-CM | POA: Diagnosis not present

## 2016-08-19 DIAGNOSIS — M542 Cervicalgia: Secondary | ICD-10-CM | POA: Diagnosis not present

## 2016-08-19 DIAGNOSIS — R278 Other lack of coordination: Secondary | ICD-10-CM | POA: Diagnosis not present

## 2016-08-19 DIAGNOSIS — R296 Repeated falls: Secondary | ICD-10-CM | POA: Diagnosis not present

## 2016-08-23 ENCOUNTER — Ambulatory Visit (INDEPENDENT_AMBULATORY_CARE_PROVIDER_SITE_OTHER): Payer: Medicare Other | Admitting: Nurse Practitioner

## 2016-08-23 ENCOUNTER — Encounter: Payer: Self-pay | Admitting: Nurse Practitioner

## 2016-08-23 VITALS — BP 124/68 | HR 86 | Temp 98.3°F | Resp 17 | Ht 60.0 in | Wt 103.8 lb

## 2016-08-23 DIAGNOSIS — G4485 Primary stabbing headache: Secondary | ICD-10-CM

## 2016-08-23 LAB — CBC WITH DIFFERENTIAL/PLATELET
BASOS ABS: 58 {cells}/uL (ref 0–200)
Basophils Relative: 1 %
EOS ABS: 348 {cells}/uL (ref 15–500)
EOS PCT: 6 %
HCT: 42.1 % (ref 35.0–45.0)
Hemoglobin: 13.7 g/dL (ref 11.7–15.5)
LYMPHS PCT: 23 %
Lymphs Abs: 1334 cells/uL (ref 850–3900)
MCH: 30.6 pg (ref 27.0–33.0)
MCHC: 32.5 g/dL (ref 32.0–36.0)
MCV: 94 fL (ref 80.0–100.0)
MONOS PCT: 18 %
MPV: 10.1 fL (ref 7.5–12.5)
Monocytes Absolute: 1044 cells/uL — ABNORMAL HIGH (ref 200–950)
NEUTROS PCT: 52 %
Neutro Abs: 3016 cells/uL (ref 1500–7800)
PLATELETS: 210 10*3/uL (ref 140–400)
RBC: 4.48 MIL/uL (ref 3.80–5.10)
RDW: 14.7 % (ref 11.0–15.0)
WBC: 5.8 10*3/uL (ref 3.8–10.8)

## 2016-08-23 LAB — COMPLETE METABOLIC PANEL WITH GFR
ALT: 7 U/L (ref 6–29)
AST: 21 U/L (ref 10–35)
Albumin: 4.2 g/dL (ref 3.6–5.1)
Alkaline Phosphatase: 49 U/L (ref 33–130)
BILIRUBIN TOTAL: 0.5 mg/dL (ref 0.2–1.2)
BUN: 22 mg/dL (ref 7–25)
CO2: 26 mmol/L (ref 20–31)
Calcium: 9.1 mg/dL (ref 8.6–10.4)
Chloride: 104 mmol/L (ref 98–110)
Creat: 1.13 mg/dL — ABNORMAL HIGH (ref 0.60–0.88)
GFR, EST AFRICAN AMERICAN: 51 mL/min — AB (ref >=60)
GFR, EST NON AFRICAN AMERICAN: 44 mL/min — AB (ref >=60)
Glucose, Bld: 101 mg/dL — ABNORMAL HIGH (ref 65–99)
POTASSIUM: 4.6 mmol/L (ref 3.5–5.3)
SODIUM: 137 mmol/L (ref 135–146)
TOTAL PROTEIN: 7 g/dL (ref 6.1–8.1)

## 2016-08-23 MED ORDER — PREDNISONE 20 MG PO TABS: 20.0000 mg | ORAL_TABLET | Freq: Every day | ORAL | Status: DC

## 2016-08-23 NOTE — Progress Notes (Signed)
Careteam: Patient Care Team: Gayland Curry, DO as PCP - General (Geriatric Medicine) Bjorn Loser, MD as Consulting Physician (Urology)  Advanced Directive information Does Patient Have a Medical Advance Directive?: Yes, Type of Advance Directive: [place];Living will;Out of facility DNR (pink MOST or yellow form)  Allergies  Allergen Reactions  . Ham Other (See Comments)  . Latex Rash  . Other Other (See Comments)    Pt reports was seen by neurologist for tremors and was allergic to all medications prescribed for tremors, but does not know names or reaction. Patient reports being allergic to polyester. Does not know what reaction would be. States she was seen by a doctor who tested her for multiple allergies and she had positive results.  . Ciprofloxacin Other (See Comments)    Doesn't work  . Doxycycline Other (See Comments)    unknown  . Erythromycin     Noted from Neurologist in [place]  . Hydrocortisone   . Levaquin [Levofloxacin In D5w] Other (See Comments)    unknown  . Lopid [Gemfibrozil]   . Macrobid [employer] Macro] Other (See Comments)    Doesn't work  . Sulfa Antibiotics Other (See Comments)    unknown    Chief Complaint  Patient presents with  . Acute Visit    Pt having headaches x 1 month. Starts at top of head and then moves down right side of neck.      HPI: Patient is a 82 y.o. female seen in the office today due to shooting pain on the top of her head which goes from the top of her head down the right side of her face, behind ear and into her neck. Been going on for about a month. Comes and goes randomly. Goes to bed because she cant stand it and it has calmed down when she gets up.  Shooting/stabbing pain.  Has had a few falls, has been dizzy. Have fallen 3 times in the last few weeks. Also weak to hands. Reports bad arthritis.  Had to get new glasses, 3-4 weeks ago but she was due for new glasses had not had new  in several years. Vision has been good since she got her new glasses.  No fevers or chills.  Heating pads help, has tried tylenol but it does not help   Review of Systems:  Review of Systems  Constitutional: Negative for appetite change, chills, fever and unexpected weight change.  HENT: Positive for hearing loss. Negative for congestion, rhinorrhea, sinus pain and sore throat.   Eyes: Negative for discharge and visual disturbance.  Respiratory: Negative for apnea, chest tightness and shortness of breath.   Cardiovascular: Negative for chest pain, palpitations and leg swelling.  Gastrointestinal: Negative for abdominal pain, nausea and vomiting.  Genitourinary: Negative for dysuria and urgency.  Musculoskeletal: Positive for arthralgias and gait problem. Negative for myalgias.       To hands  Neurological: Positive for weakness and headaches. Negative for dizziness, seizures, syncope, facial asymmetry, speech difficulty, light-headedness and numbness.  Psychiatric/Behavioral: Positive for confusion. Negative for behavioral problems. The patient is not nervous/anxious.     Past Medical History:  Diagnosis Date  . Allergic contact dermatitis   . Arthritis   . Closed fracture of neck of femur ([place]) 01/25/2016  . Cystitis, interstitial   . Femur fracture, right ([place]) 01/26/16  . Fracture of right inferior pubic ramus ([place]) 01/26/16  . Hepatitis    over 50 years ago  .  Long-term use of high-risk medication   . Osteopenia   . Tremors of nervous system   . Vertigo    Past Surgical History:  Procedure Laterality Date  . APPENDECTOMY    . DILATATION & CURETTAGE/HYSTEROSCOPY WITH MYOSURE N/A 06/26/2015   Procedure: DILATATION & CURETTAGE/HYSTEROSCOPY WITH MYOSURE;  Surgeon: Arvella Nigh, MD;  Location: [facility];  Service: Gynecology;  Laterality: N/A;  . FRACTURE SURGERY  2015   left hip fracture  . HIP ORIF W/ CAPSULOTOMY Left 10/22/2013   [name]   . RIGHT hip percutaneous pinning  of impacted femoral neck fracture  01/26/16   [facility]--Dr. Jaynee Eagles orthopedics  . TONSILLECTOMY     Social History:   reports that she has never smoked. She has never used smokeless tobacco. She reports that she does not drink alcohol or use drugs.  Family History  Problem Relation Age of Onset  . Dementia Mother   . Cancer Father     pancreatic  . Hepatitis Brother     Medications: Patient's Medications  New Prescriptions   No medications on file  Previous Medications   ACETAMINOPHEN (TYLENOL) 325 MG TABLET    Take 650 mg by mouth every 6 (six) hours as needed (do not exceed 3 gm of APAP in 24 hours including tylenol).   CALCIUM CARB-CHOLECALCIFEROL (CALCIUM 600+D3) 600-800 MG-UNIT TABS    Take 1 tablet by mouth daily. Take one tablet daily   CEPHALEXIN (KEFLEX) 250 MG CAPSULE    Take 250 mg by mouth daily before lunch.   CHOLECALCIFEROL (D3 HIGH POTENCY) 1000 UNITS CAPSULE    Take 1,000 Units by mouth daily. Take one tablet daily   CITALOPRAM (CELEXA) 20 MG TABLET    Take 20 mg by mouth daily.   DOCUSATE SODIUM (COLACE) 100 MG CAPSULE    Take 200 mg by mouth daily.   FLUOCINOLONE (VANOS) 0.01 % CREAM    Apply 1 application topically 2 (two) times daily as needed (skin rash).   MAGNESIUM 400 MG CAPS    Take 1 tablet by mouth daily. Take one tablet daily   MIRABEGRON ER (MYRBETRIQ) 50 MG TB24 TABLET    Take 50 mg by mouth daily.   OMEGA-3 FATTY ACIDS (FISH OIL) 1200 MG CAPS    Take 2 capsules by mouth daily. Take one tablet twice daily   POLYETHYL GLYCOL-PROPYL GLYCOL (SYSTANE OP)    Place 1 drop into both eyes 4 (four) times daily.   POLYETHYLENE GLYCOL (MIRALAX / GLYCOLAX) PACKET    Take 17 g by mouth daily.   RALOXIFENE (EVISTA) 60 MG TABLET    Take 1 tablet (60 mg total) by mouth daily. Take one table daily   VITAMIN B-12 (CYANOCOBALAMIN) 1000 MCG TABLET    Take 1,000 mcg by mouth every other day.  Modified Medications   No medications on file  Discontinued Medications   No  medications on file     Physical Exam:  Vitals:   08/23/16 1357  BP: 124/68  Pulse: 86  Resp: 17  Temp: 98.3 F (36.8 C)  TempSrc: Oral  SpO2: 97%  Weight: 103 lb 12.8 oz (47.1 kg)  Height: 5' (1.524 m)   Body mass index is 20.27 kg/m.  Physical Exam  Constitutional:  Thin female,  unsteady on standing and if stops when walking  Eyes:  New glasses  Cardiovascular: Normal rate, regular rhythm, normal heart sounds and intact distal pulses.   Pulmonary/Chest: Effort normal and breath sounds normal. No respiratory  distress.  Abdominal: Soft. Bowel sounds are normal.  Musculoskeletal:  Chronic left hip pain  Neurological: She is alert. She displays normal reflexes. No cranial nerve deficit or sensory deficit. Coordination normal.  Bilateral weakness to hands  Skin: Skin is warm and dry.  Psychiatric: She has a normal mood and affect.    Labs reviewed: Basic Metabolic Panel:  Recent Labs  11/20/15 01/28/16 03/04/16 0400  NA 138 140  --   K 4.3 4.4  --   BUN 33* 26*  --   CREATININE 1.5* 1.2*  --   TSH  --   --  2.19   Liver Function Tests:  Recent Labs  11/20/15  AST 22  ALT 8  ALKPHOS 39   No results for input(s): LIPASE, AMYLASE in the last 8760 hours. No results for input(s): AMMONIA in the last 8760 hours. CBC:  Recent Labs  11/20/15 01/28/16 02/01/16 0500  WBC 4.2 6.7 5.3  HGB 13.5 11.8* 12.5  HCT 39 37 42  PLT 176 134* 184   Lipid Panel: No results for input(s): CHOL, HDL, LDLCALC, TRIG, CHOLHDL, LDLDIRECT in the last 8760 hours. TSH:  Recent Labs  03/04/16 0400  TSH 2.19   A1C: No results found for: HGBA1C   Assessment/Plan 1. Primary stabbing headache R/o PMR/GCA - Sedimentation Rate - C-reactive Protein - CBC with Differential/Platelets - CMP with eGFR - predniSONE (DELTASONE) 20 MG tablet; Take 1 tablet (20 mg total) by mouth daily with breakfast. -if lab work negative consider head CT due to persistent headache for 1  month  Follow up in 1 week   Jessica K. Harle Battiest  [facility] [phone] 8 am - 5 pm) [phone] (after hours)

## 2016-08-23 NOTE — Patient Instructions (Addendum)
To start Prednisone 20 mg daily due to headaches- labs drawn today.   Follow up in 1 week

## 2016-08-24 ENCOUNTER — Telehealth: Payer: Self-pay | Admitting: *Deleted

## 2016-08-24 DIAGNOSIS — Z9181 History of falling: Secondary | ICD-10-CM | POA: Diagnosis not present

## 2016-08-24 DIAGNOSIS — R296 Repeated falls: Secondary | ICD-10-CM | POA: Diagnosis not present

## 2016-08-24 DIAGNOSIS — M542 Cervicalgia: Secondary | ICD-10-CM | POA: Diagnosis not present

## 2016-08-24 DIAGNOSIS — R278 Other lack of coordination: Secondary | ICD-10-CM | POA: Diagnosis not present

## 2016-08-24 LAB — SEDIMENTATION RATE: SED RATE: 9 mm/h (ref 0–30)

## 2016-08-24 LAB — C-REACTIVE PROTEIN: CRP: 1.4 mg/L (ref <8.0)

## 2016-08-24 NOTE — Telephone Encounter (Signed)
Ceris with [facility] called and stated that patient saw Jessica yesterday and given Prednisone. The Pharmacy questioned it because she has a allergy to Hydrocortisone. Patient took the Prednisone this morning and did not have reaction. Patient has no recollection of what her allergy was to the Hydrocortisone. Please Advise.

## 2016-08-24 NOTE — Telephone Encounter (Signed)
Yes I talked to her yesterday about this "allergy" pt stated she did not remember an allergy to hydrocortisone

## 2016-08-25 ENCOUNTER — Encounter: Payer: Self-pay | Admitting: Internal Medicine

## 2016-08-25 DIAGNOSIS — Z9181 History of falling: Secondary | ICD-10-CM | POA: Diagnosis not present

## 2016-08-25 DIAGNOSIS — R278 Other lack of coordination: Secondary | ICD-10-CM | POA: Diagnosis not present

## 2016-08-25 DIAGNOSIS — R296 Repeated falls: Secondary | ICD-10-CM | POA: Diagnosis not present

## 2016-08-25 DIAGNOSIS — M542 Cervicalgia: Secondary | ICD-10-CM | POA: Diagnosis not present

## 2016-08-26 ENCOUNTER — Telehealth: Payer: Self-pay | Admitting: *Deleted

## 2016-08-26 DIAGNOSIS — M542 Cervicalgia: Secondary | ICD-10-CM | POA: Diagnosis not present

## 2016-08-26 DIAGNOSIS — M5030 Other cervical disc degeneration, unspecified cervical region: Secondary | ICD-10-CM

## 2016-08-26 DIAGNOSIS — G8929 Other chronic pain: Secondary | ICD-10-CM

## 2016-08-26 DIAGNOSIS — R51 Headache: Principal | ICD-10-CM

## 2016-08-26 DIAGNOSIS — R519 Headache, unspecified: Secondary | ICD-10-CM

## 2016-08-26 DIAGNOSIS — R296 Repeated falls: Secondary | ICD-10-CM | POA: Diagnosis not present

## 2016-08-26 DIAGNOSIS — R278 Other lack of coordination: Secondary | ICD-10-CM | POA: Diagnosis not present

## 2016-08-26 DIAGNOSIS — Z9181 History of falling: Secondary | ICD-10-CM | POA: Diagnosis not present

## 2016-08-26 NOTE — Telephone Encounter (Signed)
Spoke with daughter about results of neck xray and per Dr. Mariea Clonts, " this may be the cause of her headaches, suspect she has a nerve getting compressed, and MRI could determine that if she can have one"   Daughter would like to proceed with MRI

## 2016-08-30 ENCOUNTER — Telehealth: Payer: Self-pay

## 2016-08-30 DIAGNOSIS — R278 Other lack of coordination: Secondary | ICD-10-CM | POA: Diagnosis not present

## 2016-08-30 DIAGNOSIS — Z9181 History of falling: Secondary | ICD-10-CM | POA: Diagnosis not present

## 2016-08-30 DIAGNOSIS — R296 Repeated falls: Secondary | ICD-10-CM | POA: Diagnosis not present

## 2016-08-30 DIAGNOSIS — M542 Cervicalgia: Secondary | ICD-10-CM | POA: Diagnosis not present

## 2016-08-30 NOTE — Telephone Encounter (Signed)
MRI ordered. Resident lives in [facility] [place] so will ask for them to arrange due to transportation needs, but typically, i'm asked by the imaging center to enter an order anyway.

## 2016-08-30 NOTE — Telephone Encounter (Signed)
Patient's daughter called to see what she needed to do to have patient's MRI done. The order was placed for the MRI and per Dr. Cyndi Lennert note, [facility] was supposed to arrange transportation. Mrs. Melanie Freeman also wanted to cancel the 09/01/16 follow up appointment due to not having MRI results. That appointment was cancelled.

## 2016-08-31 DIAGNOSIS — R296 Repeated falls: Secondary | ICD-10-CM | POA: Diagnosis not present

## 2016-08-31 DIAGNOSIS — M542 Cervicalgia: Secondary | ICD-10-CM | POA: Diagnosis not present

## 2016-08-31 DIAGNOSIS — Z9181 History of falling: Secondary | ICD-10-CM | POA: Diagnosis not present

## 2016-08-31 DIAGNOSIS — R278 Other lack of coordination: Secondary | ICD-10-CM | POA: Diagnosis not present

## 2016-09-01 ENCOUNTER — Ambulatory Visit: Payer: Medicare Other | Admitting: Nurse Practitioner

## 2016-09-02 DIAGNOSIS — R278 Other lack of coordination: Secondary | ICD-10-CM | POA: Diagnosis not present

## 2016-09-02 DIAGNOSIS — Z9181 History of falling: Secondary | ICD-10-CM | POA: Diagnosis not present

## 2016-09-02 DIAGNOSIS — R296 Repeated falls: Secondary | ICD-10-CM | POA: Diagnosis not present

## 2016-09-02 DIAGNOSIS — M542 Cervicalgia: Secondary | ICD-10-CM | POA: Diagnosis not present

## 2016-09-05 DIAGNOSIS — R296 Repeated falls: Secondary | ICD-10-CM | POA: Diagnosis not present

## 2016-09-05 DIAGNOSIS — Z9181 History of falling: Secondary | ICD-10-CM | POA: Diagnosis not present

## 2016-09-05 DIAGNOSIS — R278 Other lack of coordination: Secondary | ICD-10-CM | POA: Diagnosis not present

## 2016-09-05 DIAGNOSIS — M542 Cervicalgia: Secondary | ICD-10-CM | POA: Diagnosis not present

## 2016-09-06 DIAGNOSIS — M542 Cervicalgia: Secondary | ICD-10-CM | POA: Diagnosis not present

## 2016-09-06 DIAGNOSIS — R296 Repeated falls: Secondary | ICD-10-CM | POA: Diagnosis not present

## 2016-09-06 DIAGNOSIS — Z9181 History of falling: Secondary | ICD-10-CM | POA: Diagnosis not present

## 2016-09-06 DIAGNOSIS — R278 Other lack of coordination: Secondary | ICD-10-CM | POA: Diagnosis not present

## 2016-09-07 ENCOUNTER — Other Ambulatory Visit: Payer: Medicare Other

## 2016-09-13 ENCOUNTER — Ambulatory Visit
Admission: RE | Admit: 2016-09-13 | Discharge: 2016-09-13 | Disposition: A | Discharge status: Home / Self Care | Payer: Medicare Other | Source: Ambulatory Visit | Attending: Internal Medicine | Admitting: Internal Medicine

## 2016-09-13 DIAGNOSIS — M5030 Other cervical disc degeneration, unspecified cervical region: Secondary | ICD-10-CM

## 2016-09-13 DIAGNOSIS — M4802 Spinal stenosis, cervical region: Secondary | ICD-10-CM | POA: Diagnosis not present

## 2016-09-13 DIAGNOSIS — R519 Headache, unspecified: Secondary | ICD-10-CM

## 2016-09-13 DIAGNOSIS — R51 Headache: Principal | ICD-10-CM

## 2016-09-16 DIAGNOSIS — Z9181 History of falling: Secondary | ICD-10-CM | POA: Diagnosis not present

## 2016-09-16 DIAGNOSIS — R296 Repeated falls: Secondary | ICD-10-CM | POA: Diagnosis not present

## 2016-09-16 DIAGNOSIS — M542 Cervicalgia: Secondary | ICD-10-CM | POA: Diagnosis not present

## 2016-09-16 DIAGNOSIS — R278 Other lack of coordination: Secondary | ICD-10-CM | POA: Diagnosis not present

## 2016-09-19 DIAGNOSIS — R278 Other lack of coordination: Secondary | ICD-10-CM | POA: Diagnosis not present

## 2016-09-19 DIAGNOSIS — R296 Repeated falls: Secondary | ICD-10-CM | POA: Diagnosis not present

## 2016-09-19 DIAGNOSIS — Z9181 History of falling: Secondary | ICD-10-CM | POA: Diagnosis not present

## 2016-09-19 DIAGNOSIS — M542 Cervicalgia: Secondary | ICD-10-CM | POA: Diagnosis not present

## 2016-09-20 DIAGNOSIS — R278 Other lack of coordination: Secondary | ICD-10-CM | POA: Diagnosis not present

## 2016-09-20 DIAGNOSIS — R296 Repeated falls: Secondary | ICD-10-CM | POA: Diagnosis not present

## 2016-09-20 DIAGNOSIS — Z9181 History of falling: Secondary | ICD-10-CM | POA: Diagnosis not present

## 2016-09-20 DIAGNOSIS — M542 Cervicalgia: Secondary | ICD-10-CM | POA: Diagnosis not present

## 2016-09-21 ENCOUNTER — Non-Acute Institutional Stay: Payer: Medicare Other | Admitting: Internal Medicine

## 2016-09-21 ENCOUNTER — Encounter: Payer: Self-pay | Admitting: Internal Medicine

## 2016-09-21 VITALS — BP 120/70 | HR 78 | Temp 98.3°F | Wt 105.0 lb

## 2016-09-21 DIAGNOSIS — F02818 Dementia in other diseases classified elsewhere, unspecified severity, with other behavioral disturbance: Secondary | ICD-10-CM

## 2016-09-21 DIAGNOSIS — F0281 Dementia in other diseases classified elsewhere with behavioral disturbance: Secondary | ICD-10-CM | POA: Diagnosis not present

## 2016-09-21 DIAGNOSIS — R278 Other lack of coordination: Secondary | ICD-10-CM | POA: Diagnosis not present

## 2016-09-21 DIAGNOSIS — G6289 Other specified polyneuropathies: Secondary | ICD-10-CM | POA: Diagnosis not present

## 2016-09-21 DIAGNOSIS — G301 Alzheimer's disease with late onset: Secondary | ICD-10-CM

## 2016-09-21 DIAGNOSIS — M4722 Other spondylosis with radiculopathy, cervical region: Secondary | ICD-10-CM | POA: Diagnosis not present

## 2016-09-21 DIAGNOSIS — M542 Cervicalgia: Secondary | ICD-10-CM | POA: Diagnosis not present

## 2016-09-21 DIAGNOSIS — G44021 Chronic cluster headache, intractable: Secondary | ICD-10-CM

## 2016-09-21 DIAGNOSIS — Z9181 History of falling: Secondary | ICD-10-CM | POA: Diagnosis not present

## 2016-09-21 DIAGNOSIS — R2681 Unsteadiness on feet: Secondary | ICD-10-CM | POA: Diagnosis not present

## 2016-09-21 DIAGNOSIS — R296 Repeated falls: Secondary | ICD-10-CM | POA: Diagnosis not present

## 2016-09-21 NOTE — Progress Notes (Signed)
Location:  Occupational psychologist of Service:  Clinic (12)  Provider: Zarai Orsborn L. Mariea Clonts, D.O., C.M.D.  Code Status: DNR Goals of Care:  Advanced Directives 09/21/2016  Does Patient Have a Medical Advance Directive? Yes  Type of Advance Directive Out of facility DNR (pink MOST or yellow form);[place];Living will  Does patient want to make changes to medical advance directive? No - Patient declined  Copy of [place] in Chart? Yes  Would patient like information on creating a medical advance directive? -  Pre-existing out of facility DNR order (yellow form or pink MOST form) Yellow form placed in chart (order not valid for inpatient use)     Chief Complaint  Patient presents with  . Medical Management of Chronic Issues    60mh follow-up    HPI: Patient is a 83y.o. female seen today for medical management of chronic diseases.    Since last time, she was having headaches on the right side of her head.  She had xrays of her neck showing arthritis and then a cervical MRI that revealed nerve compression and cord compression.  She has moderate dementia and is not a very good surgical candidate.  Neck does hurt her on the right side beneath her ear.    She is still having the headaches and they get so bad she can hardly stand it. Says headaches are right on the top of her head.  Says she sleeps with a towel on her head and hears a jackhammer sound--says that she is not actually hearing a jackhammer, she's just describing the severity of the pain.    [facility] [place]  She says she had an MRI there.    Her right eye is "swollen and burning right now."  It was fine Sunday per her daughter.  Says it swells and her eye stays closed.  She's been putting systane drops in it which I ordered when nursing reported mild redness of the eye.  Oddly, as we began talking about other topics, her eye  problem resolved entirely w/o any residual redness or swelling when she was not rubbing it with her hand or covering it.    At the end of the appt, I met with her daughter alone.  She reports that pt historically exaggerates and sometimes seems to make up symptoms.  There is uncertainty to her multiple allergies to environmental things (will only wear pure cotton clothing, for example).    Past Medical History:  Diagnosis Date  . Allergic contact dermatitis   . Arthritis   . Closed fracture of neck of femur ([place] 01/25/2016  . Cystitis, interstitial   . [place]ur fracture, right ([place] 01/26/16  . Fracture of right inferior pubic ramus ([place] 01/26/16  . Hepatitis    over 50 years ago  . Long-term use of high-risk medication   . Osteopenia   . Tremors of nervous system   . Vertigo     Past Surgical History:  Procedure Laterality Date  . APPENDECTOMY    . DILATATION & CURETTAGE/HYSTEROSCOPY WITH MYOSURE N/A 06/26/2015   Procedure: DILATATION & CURETTAGE/HYSTEROSCOPY WITH MYOSURE;  Surgeon: JArvella Nigh MD;  [facility];  Service: Gynecology;  Laterality: N/A;  . FRACTURE SURGERY  2015   left hip fracture  . HIP ORIF W/ CAPSULOTOMY Left 10/22/2013   [facility]  . RIGHT hip percutaneous pinning of impacted femoral neck fracture  01/26/16   [facility]--Dr. Jaynee Eagles orthopedics  . TONSILLECTOMY      Allergies  Allergen Reactions  . Ham Other (See Comments)  . Latex Rash  . Other Other (See Comments)    Pt reports was seen by neurologist for tremors and was allergic to all medications prescribed for tremors, but does not know names or reaction. Patient reports being allergic to polyester. Does not know what reaction would be. States she was seen by a doctor who tested her for multiple allergies and she had positive results.  . Ciprofloxacin Other (See Comments)    Doesn't work  . Doxycycline Other (See Comments)    unknown  . Erythromycin     Noted from Neurologist in [place]  .  Hydrocortisone   . Levaquin [Levofloxacin In D5w] Other (See Comments)    unknown  . Lopid [Gemfibrozil]   . Macrobid [employer] Macro] Other (See Comments)    Doesn't work  . Sulfa Antibiotics Other (See Comments)    unknown    Allergies as of 09/21/2016      Reactions   Ham Other (See Comments)   Latex Rash   Other Other (See Comments)   Pt reports was seen by neurologist for tremors and was allergic to all medications prescribed for tremors, but does not know names or reaction. Patient reports being allergic to polyester. Does not know what reaction would be. States she was seen by a doctor who tested her for multiple allergies and she had positive results.   Ciprofloxacin Other (See Comments)   Doesn't work   Doxycycline Other (See Comments)   unknown   Erythromycin    Noted from Neurologist in [place]   Hydrocortisone    Levaquin [levofloxacin In D5w] Other (See Comments)   unknown   Lopid [gemfibrozil]    Macrobid [nitrofurantoin Monohyd Macro] Other (See Comments)   Doesn't work   Sulfa Antibiotics Other (See Comments)   unknown      Medication List       Accurate as of 09/21/16  3:52 PM. Always use your most recent med list.          acetaminophen 325 MG tablet Commonly known as:  TYLENOL Take 650 mg by mouth every 6 (six) hours as needed (do not exceed 3 gm of APAP in 24 hours including tylenol).   cephALEXin 250 MG capsule Commonly known as:  KEFLEX Take 250 mg by mouth daily before lunch.   citalopram 20 MG tablet Commonly known as:  CELEXA Take 20 mg by mouth daily.   D3 HIGH POTENCY 1000 units capsule Generic drug:  Cholecalciferol Take 1,000 Units by mouth daily. Take one tablet daily   docusate sodium 100 MG capsule Commonly known as:  COLACE Take 200 mg by mouth daily.   fluocinolone 0.01 % cream Commonly known as:  VANOS Apply 1 application topically 2 (two) times daily as needed (skin rash).   MYRBETRIQ 50 MG Tb24  tablet Generic drug:  mirabegron ER Take 50 mg by mouth daily.   raloxifene 60 MG tablet Commonly known as:  EVISTA Take 1 tablet (60 mg total) by mouth daily. Take one table daily   SYSTANE OP Place 1 drop into both eyes 4 (four) times daily.   vitamin B-12 1000 MCG tablet Commonly known as:  CYANOCOBALAMIN Take 1,000 mcg by mouth every other day.       Review of Systems:  Review of Systems  Constitutional: Negative for chills, fever and malaise/fatigue.  HENT: Negative for congestion.   Eyes: Positive for pain and redness. Negative for blurred vision, double vision, photophobia and discharge.  Respiratory: Negative for cough and shortness of breath.   Cardiovascular: Negative for chest pain, palpitations and leg swelling.  Gastrointestinal: Negative for abdominal pain, blood in stool, constipation and melena.  Genitourinary: Positive for frequency and urgency. Negative for dysuria.       Chronic urinary complaints  Musculoskeletal: Positive for falls.  Skin: Negative for itching and rash.  Neurological: Positive for tremors, sensory change and headaches. Negative for dizziness, tingling, speech change, focal weakness, seizures, loss of consciousness and weakness.       Neuropathy in feet  Endo/Heme/Allergies: Does not bruise/bleed easily.  Psychiatric/Behavioral: Positive for depression and memory loss. Negative for hallucinations, substance abuse and suicidal ideas. The patient is nervous/anxious. The patient does not have insomnia.     Health Maintenance  Topic Date Due  . DEXA SCAN  07/20/1996  . ZOSTAVAX  08/29/2018 (Originally 07/21/1991)  . TETANUS/TDAP  08/29/2018 (Originally 07/20/1948)  . PNA vac Low Risk Adult (2 of 2 - PPSV23) 08/29/2018 (Originally 09/28/2015)  . INFLUENZA VACCINE  Completed    Physical Exam: Vitals:   09/21/16 1528  BP: 120/70  Pulse: 78  Temp: 98.3 F (36.8 C)  TempSrc: Oral  SpO2: 96%  Weight: 105 lb (47.6 kg)   Body mass index  is 20.51 kg/m. Physical Exam  Constitutional: No distress.  HENT:  Head: Normocephalic and atraumatic.  Eyes: Conjunctivae and EOM are normal. Pupils are equal, round, and reactive to light. Right eye exhibits no discharge. Left eye exhibits no discharge. No scleral icterus.  Erythema resolved when she stopped rubbing her eye, glasses  Cardiovascular: Normal rate, regular rhythm, normal heart sounds and intact distal pulses.   Pulmonary/Chest: Effort normal and breath sounds normal. No respiratory distress.  Abdominal: Soft. Bowel sounds are normal.  Musculoskeletal: Normal range of motion.  Unsteady gait, uses walker, but still needs reminding to do so often  Neurological: She is alert.  Oriented to person and place, not time, repeats herself and poor historian  Skin: Skin is warm and dry. There is pallor.  Psychiatric:  Anxious; lacks insight into her cognitive losses and overall state of health    Labs reviewed: Basic Metabolic Panel:  Recent Labs  11/20/15 01/28/16 03/04/16 0400 08/23/16 1434  NA 138 140  --  137  K 4.3 4.4  --  4.6  CL  --   --   --  104  CO2  --   --   --  26  GLUCOSE  --   --   --  101*  BUN 33* 26*  --  22  CREATININE 1.5* 1.2*  --  1.13*  CALCIUM  --   --   --  9.1  TSH  --   --  2.19  --    Liver Function Tests:  Recent Labs  11/20/15 08/23/16 1434  AST 22 21  ALT 8 7  ALKPHOS 39 49  BILITOT  --  0.5  PROT  --  7.0  ALBUMIN  --  4.2   No results for input(s): LIPASE, AMYLASE in the last 8760 hours. No results for input(s): AMMONIA in the last 8760 hours. CBC:  Recent Labs  01/28/16 02/01/16 0500 08/23/16 1434  WBC 6.7 5.3 5.8  NEUTROABS  --   --  3,016  HGB 11.8* 12.5 13.7  HCT 37 42 42.1  MCV  --   --  94.0  PLT 134* 184 210   Lipid Panel: No results for input(s): CHOL, HDL, LDLCALC, TRIG, CHOLHDL, LDLDIRECT in the last 8760 hours. No results found for: HGBA1C  Procedures since last visit: Mr Cervical Spine Wo  Contrast  Result Date: 09/13/2016 CLINICAL DATA:  Headaches and right-sided neck pain. EXAM: MRI CERVICAL SPINE WITHOUT CONTRAST TECHNIQUE: Multiplanar, multisequence MR imaging of the cervical spine was performed. No intravenous contrast was administered. COMPARISON:  None. FINDINGS: Examination is moderately motion degraded due to patient tremors. Alignment: Slight anterolisthesis of C2 on C3, C7 on T1, T1 on T2, and T2 on T3. Slight retrolisthesis of C3 on C4, C4 on C5, and C5 on C6, degenerative in appearance. Vertebrae: No evidence of fracture, destructive osseous lesion, or significant marrow edema. Mild degenerative endplate marrow changes C3-C6. Cord: No definite spinal cord signal abnormality within limitations of motion artifact. Posterior Fossa, vertebral arteries, paraspinal tissues: Unremarkable. Disc levels: C2-3: Small to moderate-sized right central disc protrusion contacts and moderately indents the ventral spinal cord. The AP diameter of the canal is narrowed to 7 mm focally in the midline due to the disc protrusion. Left uncovertebral spurring results in mild left neural foraminal stenosis. C3-4: Severe disc space narrowing. Mild disc bulging and uncovertebral spurring without stenosis. C4-5: Severe disc space narrowing. Osteophytic ridging without significant stenosis. C5-6: Severe disc space narrowing. Diffuse posterior spurring results in mild spinal stenosis with a slight impression on the ventral spinal cord. No definite neural foraminal stenosis. C6-7: Broad-based posterior disc osteophyte complex without significant stenosis. C7-T1:  Right greater than left facet arthrosis without stenosis. IMPRESSION: 1. Motion degraded examination. 2. Central C2-3 disc protrusion with moderate spinal cord flattening. 3. Severe disc degeneration from C3-4 to C5-6. Mild spinal stenosis due to spurring at C5-6. Electronically Signed   By: Logan Bores M.D.   On: 09/13/2016 13:54    Assessment/Plan 1.  Degenerative arthritis of cervical spine with nerve compression - reviewed MRI with pt and her daughter - this could be contributing to her gait abnormality along with the neuropathy as well as her headaches, but i'm having difficulty knowing if her symptoms are legitimate (vs psychosomatic) and whether she remembers them as they truly happen either -unfortunately, I did not meet her until she already had some dementia making it hard to determine how much is dementia vs. Other psychiatric disease at baseline - Ambulatory referral to Neurology to determine if her headaches are in fact due to her cervical spine disease as seen on MRI or a separate etiology -pt is not a good surgical candidate with her dementia and absence of insight  2. Other polyneuropathy ([place]) -chronic and longstanding -since moving here, tsh, b12/folate have been normal (not all seen in epic due to abstractability, but may be scanned) -we did opt to try her on gabapentin (actually I believe it was tried before, but pt did not follow through on it at home before she moved to assisted living)  3. Unsteady gait -ongoing, not always compliant with walker use - cont walker, try gabapentin for neuropathy, labs have been historically wnl for neuropathy and takes b12 anyway - Ambulatory referral to Neurology  4. Late onset Alzheimer's disease with behavioral disturbance - she had not come to terms with this diagnosis so attempts at medicating her for it have not been made lately -  MMSE - Mini Mental State Exam 08/12/2015  Orientation to time 4  Orientation to Place 5  Registration 3  Attention/ Calculation 5  Recall 3  Language- name 2 objects 2  Language- repeat 1  Language- follow 3 step command 3  Language- read & follow direction 1  Write a sentence 1  Copy design 1  Total score 29  -has had more recent MMSE in AL chart (not with me at present) - Ambulatory referral to Neurology  5. Frequent  headaches-  -  Ambulatory referral to Neurology--see above in hpi, usually takes tylenol--sometimes helps, sometimes doesn't -unusual presentation and I"m not certain if this is coming from her neck pathology or another etiology and need safe treatment recs in this frail lady  Labs/tests ordered:   Orders Placed This Encounter  Procedures  . Ambulatory referral to Neurology    Referral Priority:   Routine    Referral Type:   Consultation    Referral Reason:   Specialty Services Required    Requested Specialty:   Neurology    Number of Visits Requested:   1   Next appt:  12/21/2016   Perris Tripathi L. Kirtis Challis, D.O. [facility] [address],  [address] Cell Phone (Mon-Fri 8am-5pm):  [phone] On Call:  [phone] & follow prompts after 5pm & weekends Office Phone:  [phone] Office Fax:  [phone]

## 2016-09-27 DIAGNOSIS — R296 Repeated falls: Secondary | ICD-10-CM | POA: Diagnosis not present

## 2016-09-27 DIAGNOSIS — M542 Cervicalgia: Secondary | ICD-10-CM | POA: Diagnosis not present

## 2016-09-27 DIAGNOSIS — Z9181 History of falling: Secondary | ICD-10-CM | POA: Diagnosis not present

## 2016-09-27 DIAGNOSIS — R278 Other lack of coordination: Secondary | ICD-10-CM | POA: Diagnosis not present

## 2016-09-28 DIAGNOSIS — Z9181 History of falling: Secondary | ICD-10-CM | POA: Diagnosis not present

## 2016-09-28 DIAGNOSIS — R296 Repeated falls: Secondary | ICD-10-CM | POA: Diagnosis not present

## 2016-09-28 DIAGNOSIS — M542 Cervicalgia: Secondary | ICD-10-CM | POA: Diagnosis not present

## 2016-09-28 DIAGNOSIS — R278 Other lack of coordination: Secondary | ICD-10-CM | POA: Diagnosis not present

## 2016-09-29 DIAGNOSIS — Z9181 History of falling: Secondary | ICD-10-CM | POA: Diagnosis not present

## 2016-09-29 DIAGNOSIS — M542 Cervicalgia: Secondary | ICD-10-CM | POA: Diagnosis not present

## 2016-09-29 DIAGNOSIS — R296 Repeated falls: Secondary | ICD-10-CM | POA: Diagnosis not present

## 2016-09-29 DIAGNOSIS — R278 Other lack of coordination: Secondary | ICD-10-CM | POA: Diagnosis not present

## 2016-10-04 DIAGNOSIS — Z9181 History of falling: Secondary | ICD-10-CM | POA: Diagnosis not present

## 2016-10-04 DIAGNOSIS — R278 Other lack of coordination: Secondary | ICD-10-CM | POA: Diagnosis not present

## 2016-10-04 DIAGNOSIS — R296 Repeated falls: Secondary | ICD-10-CM | POA: Diagnosis not present

## 2016-10-04 DIAGNOSIS — M542 Cervicalgia: Secondary | ICD-10-CM | POA: Diagnosis not present

## 2016-10-05 DIAGNOSIS — R278 Other lack of coordination: Secondary | ICD-10-CM | POA: Diagnosis not present

## 2016-10-05 DIAGNOSIS — Z9181 History of falling: Secondary | ICD-10-CM | POA: Diagnosis not present

## 2016-10-05 DIAGNOSIS — M542 Cervicalgia: Secondary | ICD-10-CM | POA: Diagnosis not present

## 2016-10-05 DIAGNOSIS — R296 Repeated falls: Secondary | ICD-10-CM | POA: Diagnosis not present

## 2016-10-06 DIAGNOSIS — R278 Other lack of coordination: Secondary | ICD-10-CM | POA: Diagnosis not present

## 2016-10-06 DIAGNOSIS — R296 Repeated falls: Secondary | ICD-10-CM | POA: Diagnosis not present

## 2016-10-06 DIAGNOSIS — Z9181 History of falling: Secondary | ICD-10-CM | POA: Diagnosis not present

## 2016-10-06 DIAGNOSIS — M542 Cervicalgia: Secondary | ICD-10-CM | POA: Diagnosis not present

## 2016-10-11 DIAGNOSIS — Z9181 History of falling: Secondary | ICD-10-CM | POA: Diagnosis not present

## 2016-10-11 DIAGNOSIS — R278 Other lack of coordination: Secondary | ICD-10-CM | POA: Diagnosis not present

## 2016-10-11 DIAGNOSIS — R296 Repeated falls: Secondary | ICD-10-CM | POA: Diagnosis not present

## 2016-10-11 DIAGNOSIS — M542 Cervicalgia: Secondary | ICD-10-CM | POA: Diagnosis not present

## 2016-10-12 ENCOUNTER — Ambulatory Visit (INDEPENDENT_AMBULATORY_CARE_PROVIDER_SITE_OTHER): Payer: Medicare Other | Admitting: Neurology

## 2016-10-12 ENCOUNTER — Encounter: Payer: Self-pay | Admitting: Neurology

## 2016-10-12 VITALS — BP 137/79 | HR 74 | Ht 60.0 in | Wt 101.4 lb

## 2016-10-12 DIAGNOSIS — R519 Headache, unspecified: Secondary | ICD-10-CM

## 2016-10-12 DIAGNOSIS — M542 Cervicalgia: Secondary | ICD-10-CM | POA: Diagnosis not present

## 2016-10-12 DIAGNOSIS — Z9181 History of falling: Secondary | ICD-10-CM | POA: Diagnosis not present

## 2016-10-12 DIAGNOSIS — R296 Repeated falls: Secondary | ICD-10-CM | POA: Diagnosis not present

## 2016-10-12 DIAGNOSIS — R278 Other lack of coordination: Secondary | ICD-10-CM | POA: Diagnosis not present

## 2016-10-12 DIAGNOSIS — R51 Headache: Secondary | ICD-10-CM | POA: Diagnosis not present

## 2016-10-12 MED ORDER — DICLOFENAC SODIUM 1 % TD GEL: 2.0000 g | Freq: Four times a day (QID) | TRANSDERMAL | 12 refills | Status: DC

## 2016-10-12 NOTE — Progress Notes (Signed)
[facility]    Provider:  Dr Jaynee Eagles Referring Provider: Gayland Curry, DO Primary Care Physician:  Hollace Kinnier, DO  CC:  headaches  HPI:  Melanie Freeman is a 83 y.o. female here as a referral from Dr. Mariea Clonts for headaches. PMHx dementia, benign essential tremor, peripheral neuropathy, severe osteoporosis, anxiety, depression, obsessive-compulsive disorder, unsteady gait.She has new onset headches in the temple areas. The headaches started about 8 weeks ago, maybe a month before the MRI of the cervical spine. Her eyes are dry and they hurt. Her right eye is hurting her. The MRI of her neck She has a lot of neck pain. She drapes a towel over the head to sleep. She has shooting pad in the neck and a heating pad. The heat helps her neck. She gets sharp pains. She can't tell if the neck pain is related to the headaches (and she is a poor historian due to dementia). Daughter is here and provides much information. Says patient had sinus headaches in elementary school (40 years ago), Mom tells daughter that the headaches are "splitting" and radiates to the right side of the neck. The headaches are "just about every day" per patient. She is in assisted living at [facility] No time of day association. Heating pads help the neck pain and the headache.   Reviewed notes, labs and imaging from outside physicians, which showed:  Reviewed notes from primary care doctor Reed. At last appointment patient complained of headaches on the right side of her head. She had x-rays of her neck showing arthritis and a cervical MRI that revealed nerve compression and cord compression. She does have moderate dementia and is a not a very good surgical candidate. Headaches are on the right and the top of her head. She has to sleep with a towel over her head and the pain is significant. Patient has a history of multiple complaints and possibly exaggerated symptoms.   Personally reviewed images of MRI  cervical spine 09/13/2016 and agree with the following:  FINDINGS: Examination is moderately motion degraded due to patient tremors.  Alignment: Slight anterolisthesis of C2 on C3, C7 on T1, T1 on T2, and T2 on T3. Slight retrolisthesis of C3 on C4, C4 on C5, and C5 on C6, degenerative in appearance.  Vertebrae: No evidence of fracture, destructive osseous lesion, or significant marrow edema. Mild degenerative endplate marrow changes C3-C6.  Cord: No definite spinal cord signal abnormality within limitations of motion artifact.  Posterior Fossa, vertebral arteries, paraspinal tissues: Unremarkable.  Disc levels:  C2-3: Small to moderate-sized right central disc protrusion contacts and moderately indents the ventral spinal cord. The AP diameter of the canal is narrowed to 7 mm focally in the midline due to the disc protrusion. Left uncovertebral spurring results in mild left neural foraminal stenosis.  C3-4: Severe disc space narrowing. Mild disc bulging and uncovertebral spurring without stenosis.  C4-5: Severe disc space narrowing. Osteophytic ridging without significant stenosis.  C5-6: Severe disc space narrowing. Diffuse posterior spurring results in mild spinal stenosis with a slight impression on the ventral spinal cord. No definite neural foraminal stenosis.  C6-7: Broad-based posterior disc osteophyte complex without significant stenosis.  C7-T1:  Right greater than left facet arthrosis without stenosis.  IMPRESSION: 1. Motion degraded examination. 2. Central C2-3 disc protrusion with moderate spinal cord flattening. 3. Severe disc degeneration from C3-4 to C5-6. Mild spinal stenosis due to spurring at C5-6  Review of Systems: Patient complains of symptoms per HPI as well  as the following symptoms: no CP, no SOB. Pertinent negatives per HPI. All others negative.   Social History   Social History  . Marital status: Widowed    Spouse name:  N/A  . Number of children: N/A  . Years of education: N/A   Occupational History  . stay at home mom    Social History Main Topics  . Smoking status: Never Smoker  . Smokeless tobacco: Never Used  . Alcohol use No  . Drug use: No  . Sexual activity: No   Other Topics Concern  . Not on file   Social History Narrative   Lives at [facility] moved in 04/02/15   Widow   Never smoked   Alcohol none   Exercise walking, leg exercise in bed twice daily    Walks  Cane   POA, Living Will       Family History  Problem Relation Age of Onset  . Dementia Mother   . Cancer Father     pancreatic  . Hepatitis Brother     Past Medical History:  Diagnosis Date  . Allergic contact dermatitis   . Arthritis   . Closed fracture of neck of femur (HCC) 01/25/2016  . Cystitis, interstitial   . Femur fracture, right (HCC) 01/26/16  . Fracture of right inferior pubic ramus (HCC) 01/26/16  . Hepatitis    over 50 years ago  . Long-term use of high-risk medication   . Osteopenia   . Tremors of nervous system   . Vertigo     Past Surgical History:  Procedure Laterality Date  . APPENDECTOMY    . DILATATION & CURETTAGE/HYSTEROSCOPY WITH MYOSURE N/A 06/26/2015   Procedure: DILATATION & CURETTAGE/HYSTEROSCOPY WITH MYOSURE;  Surgeon: Richardean Chimera, MD;  Location: [facility];  Service: Gynecology;  Laterality: N/A;  . FRACTURE SURGERY  2015   left hip fracture  . HIP ORIF W/ CAPSULOTOMY Left 10/22/2013   [facility]   . RIGHT hip percutaneous pinning of impacted femoral neck fracture  01/26/16   [facility]--Dr. Darnell [facility]  . TONSILLECTOMY      Current Outpatient Prescriptions  Medication Sig Dispense Refill  . acetaminophen (TYLENOL) 325 MG tablet Take 650 mg by mouth every 6 (six) hours as needed (do not exceed 3 gm of APAP in 24 hours including tylenol).    . cephALEXin (KEFLEX) 250 MG capsule Take 250 mg by mouth daily before lunch.    . Cholecalciferol (D3 HIGH POTENCY) 1000 UNITS  capsule Take 1,000 Units by mouth daily. Take one tablet daily    . citalopram (CELEXA) 20 MG tablet Take 20 mg by mouth daily.    Marland Kitchen docusate sodium (COLACE) 100 MG capsule Take 200 mg by mouth daily.    . fluocinolone (VANOS) 0.01 % cream Apply 1 application topically 2 (two) times daily as needed (skin rash).    . mirabegron ER (MYRBETRIQ) 50 MG TB24 tablet Take 50 mg by mouth daily.    Bertram Gala Glycol-Propyl Glycol (SYSTANE OP) Place 1 drop into both eyes 4 (four) times daily.    . raloxifene (EVISTA) 60 MG tablet Take 1 tablet (60 mg total) by mouth daily. Take one table daily 90 tablet 3  . vitamin B-12 (CYANOCOBALAMIN) 1000 MCG tablet Take 1,000 mcg by mouth every other day.     No current facility-administered medications for this visit.     Allergies as of 10/12/2016 - Review Complete 09/21/2016  Allergen Reaction Noted  . Ham Other (See Comments)  02/02/2016  . Latex Rash 06/24/2015  . Other Other (See Comments) 02/02/2016  . Ciprofloxacin Other (See Comments) 04/27/2015  . Doxycycline Other (See Comments) 04/27/2015  . Erythromycin  02/02/2016  . Hydrocortisone  03/25/2016  . Levaquin [levofloxacin in d5w] Other (See Comments) 04/27/2015  . Lopid [gemfibrozil]  04/27/2015  . Macrobid [nitrofurantoin monohyd macro] Other (See Comments) 04/27/2015  . Sulfa antibiotics Other (See Comments) 04/27/2015    Vitals: There were no vitals taken for this visit. Last Weight:  Wt Readings from Last 1 Encounters:  09/21/16 105 lb (47.6 kg)   Last Height:   Ht Readings from Last 1 Encounters:  08/23/16 5' (1.524 m)    Physical exam: Exam: Gen: NAD, very conversant and tangential                   CV: RRR, no MRG. No Carotid Bruits. No peripheral edema, warm, nontender Eyes: Conjunctivae clear without exudates or hemorrhage  Neuro: Detailed Neurologic Exam  Speech:    Speech is normal; fluent and spontaneous Cognition:    The patient is oriented to person only    recent  and remote memory impaired ;     language fluent;     Impaired attention, concentration,    fund of knowledge Cranial Nerves:    The pupils are equal, round, and reactive to light. Attempted funduscopic exam could not visualize. Extraocular movements are intact. Trigeminal sensation is intact and the muscles of mastication are normal. The face is symmetric. The palate elevates in the midline. Hearing intact. Voice is normal. Shoulder shrug is normal. The tongue has normal motion without fasciculations.   Coordination:    No dysmetria   Gait:    Bradykinetic  Motor Observation:    Fine extremity and head tremor (likely essential tremor) Tone:    Normal muscle tone.    Posture:    Mildly stooped    Strength:    Strength is symmetrical in the upper and lower limbs without apparent weakness difficult exam due to cognition     Sensation: intact to LT     Reflex Exam:  DTR's:    Deep tendon reflexes in the upper and lower extremities are symmetrical bilaterally.   Toes:    The toes are equivocal bilaterally.   Clonus:    Clonus is absent.      Assessment/Plan:  83 year old female with dementia, musculoskeletal neck pain secondary to degenerative disk disease with resultant headaches  CT head Physical therapy  Voltaren gel If this doesn't work can try very low dose gabapentin such as '100mg'$  bid or tid which may help with tremor, neck pain and headches however there is risk of sedation and falls with any medication and I prefer to try PT and topical voltaren first ESR and crp were normal but can repeat to ensure they have not elevated.  Discussed the following:  To prevent or relieve headaches, try the following: Cool Compress. Lie down and place a cool compress on your head.  Avoid headache triggers. If certain foods or odors seem to have triggered your migraines in the past, avoid them. A headache diary might help you identify triggers.  Include physical activity in your  daily routine. Try a daily walk or other moderate aerobic exercise.  Manage stress. Find healthy ways to cope with the stressors, such as delegating tasks on your to-do list.  Practice relaxation techniques. Try deep breathing, yoga, massage and visualization.  Eat regularly. Eating regularly scheduled  meals and maintaining a healthy diet might help prevent headaches. Also, drink plenty of fluids.  Follow a regular sleep schedule. Sleep deprivation might contribute to headaches Consider biofeedback. With this mind-body technique, you learn to control certain bodily functions - such as muscle tension, heart rate and blood pressure - to prevent headaches or reduce headache pain.    Proceed to emergency room if you experience new or worsening symptoms or symptoms do not resolve, if you have new neurologic symptoms or if headache is severe, or for any concerning symptom.     Sarina Ill, MD  [facility] [address] [place], [place] [address]  Phone [phone] Fax [phone]

## 2016-10-12 NOTE — Patient Instructions (Signed)
Remember to drink plenty of fluid, eat healthy meals and do not skip any meals. Try to eat protein with a every meal and eat a healthy snack such as fruit or nuts in between meals. Try to keep a regular sleep-wake schedule and try to exercise daily, particularly in the form of walking, 20-30 minutes a day, if you can.   As far as your medications are concerned, I would like to suggest: Could try some very low-dose Gabapentin for tremor, neck pain and headache however this can cause sedation and risk of falls instead initially recommend PT and manual therapy, topical voltaren or other gel  As far as diagnostic testing: CT of the head  I would like to see you back in 3 minths, sooner if we need to. Please call us with any interim questions, concerns, problems, updates or refill requests.   Our phone number is [phone]. We also have an after hours call service for urgent matters and there is a physician on-call for urgent questions. For any emergencies you know to call 911 or go to the nearest emergency room  Gabapentin capsules or tablets  What should I tell my health care provider before I take this medicine? They need to know if you have any of these conditions: -kidney disease -suicidal thoughts, plans, or attempt; a previous suicide attempt by you or a family member -an unusual or allergic reaction to gabapentin, other medicines, foods, dyes, or preservatives -pregnant or trying to get pregnant -breast-feeding How should I use this medicine? Take this medicine by mouth with a glass of water. Follow the directions on the prescription label. You can take it with or without food. If it upsets your stomach, take it with food.Take your medicine at regular intervals. Do not take it more often than directed. Do not stop taking except on your doctor's advice. If you are directed to break the 600 or 800 mg tablets in half as part of your dose, the extra half tablet should be used for the next dose.  If you have not used the extra half tablet within 28 days, it should be thrown away. A special MedGuide will be given to you by the pharmacist with each prescription and refill. Be sure to read this information carefully each time. Talk to your pediatrician regarding the use of this medicine in children. Special care may be needed. Overdosage: If you think you have taken too much of this medicine contact a poison control center or emergency room at once. NOTE: This medicine is only for you. Do not share this medicine with others. What if I miss a dose? If you miss a dose, take it as soon as you can. If it is almost time for your next dose, take only that dose. Do not take double or extra doses. What may interact with this medicine? Do not take this medicine with any of the following medications: -other gabapentin products This medicine may also interact with the following medications: -alcohol -antacids -antihistamines for allergy, cough and cold -certain medicines for anxiety or sleep -certain medicines for depression or psychotic disturbances -homatropine; hydrocodone -naproxen -narcotic medicines (opiates) for pain -phenothiazines like chlorpromazine, mesoridazine, prochlorperazine, thioridazine This list may not describe all possible interactions. Give your health care provider a list of all the medicines, herbs, non-prescription drugs, or dietary supplements you use. Also tell them if you smoke, drink alcohol, or use illegal drugs. Some items may interact with your medicine. What should I watch for while using this  medicine? Visit your doctor or health care professional for regular checks on your progress. You may want to keep a record at home of how you feel your condition is responding to treatment. You may want to share this information with your doctor or health care professional at each visit. You should contact your doctor or health care professional if your seizures get worse or if  you have any new types of seizures. Do not stop taking this medicine or any of your seizure medicines unless instructed by your doctor or health care professional. Stopping your medicine suddenly can increase your seizures or their severity. Wear a medical identification bracelet or chain if you are taking this medicine for seizures, and carry a card that lists all your medications. You may get drowsy, dizzy, or have blurred vision. Do not drive, use machinery, or do anything that needs mental alertness until you know how this medicine affects you. To reduce dizzy or fainting spells, do not sit or stand up quickly, especially if you are an older patient. Alcohol can increase drowsiness and dizziness. Avoid alcoholic drinks. Your mouth may get dry. Chewing sugarless gum or sucking hard candy, and drinking plenty of water will help. The use of this medicine may increase the chance of suicidal thoughts or actions. Pay special attention to how you are responding while on this medicine. Any worsening of mood, or thoughts of suicide or dying should be reported to your health care professional right away. Women who become pregnant while using this medicine may enroll in the [place] Pregnancy Registry by calling [phone]. This registry collects information about the safety of antiepileptic drug use during pregnancy. What side effects may I notice from receiving this medicine? Side effects that you should report to your doctor or health care professional as soon as possible: -allergic reactions like skin rash, itching or hives, swelling of the face, lips, or tongue -worsening of mood, thoughts or actions of suicide or dying Side effects that usually do not require medical attention (report to your doctor or health care professional if they continue or are bothersome): -constipation -difficulty walking or controlling muscle movements -dizziness -nausea -slurred  speech -tiredness -tremors -weight gain This list may not describe all possible side effects. Call your doctor for medical advice about side effects. You may report side effects to FDA at 1-800-FDA-1088. Where should I keep my medicine? Keep out of reach of children. This medicine may cause accidental overdose and death if it taken by other adults, children, or pets. Mix any unused medicine with a substance like cat litter or coffee grounds. Then throw the medicine away in a sealed container like a sealed bag or a coffee can with a lid. Do not use the medicine after the expiration date. Store at room temperature between 15 and 30 degrees C (59 and 86 degrees F). NOTE: This sheet is a summary. It may not cover all possible information. If you have questions about this medicine, talk to your doctor, pharmacist, or health care provider.  2017 Elsevier/Gold Standard (2013-10-11 15:26:50)

## 2016-10-13 ENCOUNTER — Telehealth: Payer: Self-pay | Admitting: *Deleted

## 2016-10-13 ENCOUNTER — Telehealth: Payer: Self-pay | Admitting: Neurology

## 2016-10-13 LAB — C-REACTIVE PROTEIN: CRP: 0.9 mg/L (ref 0.0–4.9)

## 2016-10-13 LAB — SEDIMENTATION RATE: SED RATE: 4 mm/h (ref 0–40)

## 2016-10-13 NOTE — Telephone Encounter (Signed)
Melanie Freeman, I would like patient to go to physical therapy for her musculoskeletal neck pain and headache. She lives at [place] however. Can we find out how this can get done? Thank you!

## 2016-10-13 NOTE — Telephone Encounter (Signed)
Per Dr Jaynee Eagles, spoke with patient's daughter Eustaquio Maize, on [place] and informed her patient's labs are normal.  Inquired if the CT head was scheduled. Beth stated she was waiting for [facility] to call. Advised her she may call if she would like; she has number. She verbalized understanding, appreciation of call.

## 2016-10-13 NOTE — Telephone Encounter (Signed)
Delsa Sale, would you please fax my last office note to [facility] please as well as a prescription for the voltaren gel I prescribed? I have her info from [facility] and the provider form. Thank you!

## 2016-10-14 DIAGNOSIS — R296 Repeated falls: Secondary | ICD-10-CM | POA: Diagnosis not present

## 2016-10-14 DIAGNOSIS — R278 Other lack of coordination: Secondary | ICD-10-CM | POA: Diagnosis not present

## 2016-10-14 DIAGNOSIS — M542 Cervicalgia: Secondary | ICD-10-CM | POA: Diagnosis not present

## 2016-10-14 DIAGNOSIS — Z9181 History of falling: Secondary | ICD-10-CM | POA: Diagnosis not present

## 2016-10-14 NOTE — Telephone Encounter (Signed)
Spoke with [facility] where patient resides. Requested fax number to send office note and Rx per Dr Jaynee Eagles. Fax # received; notes and Rx faxed to [place].

## 2016-10-17 ENCOUNTER — Ambulatory Visit
Admission: RE | Admit: 2016-10-17 | Discharge: 2016-10-17 | Disposition: A | Discharge status: Home / Self Care | Payer: Medicare Other | Source: Ambulatory Visit | Attending: Neurology | Admitting: Neurology

## 2016-10-17 DIAGNOSIS — R278 Other lack of coordination: Secondary | ICD-10-CM | POA: Diagnosis not present

## 2016-10-17 DIAGNOSIS — M542 Cervicalgia: Secondary | ICD-10-CM | POA: Diagnosis not present

## 2016-10-17 DIAGNOSIS — R51 Headache: Secondary | ICD-10-CM | POA: Diagnosis not present

## 2016-10-17 DIAGNOSIS — Z9181 History of falling: Secondary | ICD-10-CM | POA: Diagnosis not present

## 2016-10-17 DIAGNOSIS — R519 Headache, unspecified: Secondary | ICD-10-CM

## 2016-10-17 DIAGNOSIS — R296 Repeated falls: Secondary | ICD-10-CM | POA: Diagnosis not present

## 2016-10-18 ENCOUNTER — Other Ambulatory Visit: Payer: Self-pay | Admitting: Neurology

## 2016-10-18 ENCOUNTER — Telehealth: Payer: Self-pay

## 2016-10-18 DIAGNOSIS — M542 Cervicalgia: Secondary | ICD-10-CM

## 2016-10-18 NOTE — Telephone Encounter (Signed)
Hi Dr. Jaynee Eagles Sorry for Delay Just please like regular Referral and I will fax to [facility]'s . Thanks Hinton Dyer.

## 2016-10-18 NOTE — Telephone Encounter (Signed)
Called pt w/ lab results. Verbalized understanding and appreciation for call. Would like PT referral sent to [facility] as discussed at [place].

## 2016-10-18 NOTE — Telephone Encounter (Signed)
Done, can you sent this stat? I saw her a week ago, need to jump on this and get it done thanks

## 2016-10-18 NOTE — Telephone Encounter (Signed)
-----   Message from Melvenia Beam, MD sent at 10/17/2016  8:30 PM EST ----- No cause for headaches seen in the CT head (which is good news) thanks

## 2016-10-18 NOTE — Telephone Encounter (Signed)
Order has been sent to [facility] [phone] . Received OK conformation.

## 2016-10-19 ENCOUNTER — Encounter: Payer: Self-pay | Admitting: Neurology

## 2016-10-19 DIAGNOSIS — R296 Repeated falls: Secondary | ICD-10-CM | POA: Diagnosis not present

## 2016-10-19 DIAGNOSIS — R278 Other lack of coordination: Secondary | ICD-10-CM | POA: Diagnosis not present

## 2016-10-19 DIAGNOSIS — Z9181 History of falling: Secondary | ICD-10-CM | POA: Diagnosis not present

## 2016-10-19 DIAGNOSIS — M542 Cervicalgia: Secondary | ICD-10-CM | POA: Diagnosis not present

## 2016-10-19 NOTE — Telephone Encounter (Signed)
Rob/[facility] [phone]   (f) [phone] called request PT to be changed to OT. He said pt is already being seen for OT for neck pain, but needs order changed to OT, everything else can remain the same.

## 2016-10-19 NOTE — Telephone Encounter (Signed)
Order placed for OT. Please perform both PT and OT if able.

## 2016-10-19 NOTE — Addendum Note (Signed)
Addended by: Monte Fantasia on: 10/19/2016 05:52 PM   Modules accepted: Orders

## 2016-10-20 ENCOUNTER — Telehealth: Payer: Self-pay | Admitting: Neurology

## 2016-10-20 NOTE — Telephone Encounter (Signed)
Melanie Freeman  From [facility] off occupational Therapy  Needs order to say occupational therapy only . Per Rob all neck Pain needs  can be done under occupational therapy . Spoke to Dr. Jaynee Eagles Verbal and she was good with this.   Telephone  [phone]- fax [phone].  Thanks Anderson Malta will you re- order for OT only.

## 2016-10-20 NOTE — Telephone Encounter (Signed)
Order for OT Sent [facility] F[facility] [phone]

## 2016-10-20 NOTE — Telephone Encounter (Signed)
That's great thanks.

## 2016-10-21 DIAGNOSIS — R296 Repeated falls: Secondary | ICD-10-CM | POA: Diagnosis not present

## 2016-10-21 DIAGNOSIS — M542 Cervicalgia: Secondary | ICD-10-CM | POA: Diagnosis not present

## 2016-10-21 DIAGNOSIS — R278 Other lack of coordination: Secondary | ICD-10-CM | POA: Diagnosis not present

## 2016-10-21 DIAGNOSIS — Z9181 History of falling: Secondary | ICD-10-CM | POA: Diagnosis not present

## 2016-10-24 DIAGNOSIS — R278 Other lack of coordination: Secondary | ICD-10-CM | POA: Diagnosis not present

## 2016-10-24 DIAGNOSIS — R296 Repeated falls: Secondary | ICD-10-CM | POA: Diagnosis not present

## 2016-10-24 DIAGNOSIS — Z9181 History of falling: Secondary | ICD-10-CM | POA: Diagnosis not present

## 2016-10-24 DIAGNOSIS — M542 Cervicalgia: Secondary | ICD-10-CM | POA: Diagnosis not present

## 2016-10-24 NOTE — Telephone Encounter (Signed)
Amy with [facility] is calling stating the order that was received for the patient was for PT and to add OT. The order should read OT only. Please fax order to  [phone].

## 2016-10-26 DIAGNOSIS — Z9181 History of falling: Secondary | ICD-10-CM | POA: Diagnosis not present

## 2016-10-26 DIAGNOSIS — R296 Repeated falls: Secondary | ICD-10-CM | POA: Diagnosis not present

## 2016-10-26 DIAGNOSIS — R278 Other lack of coordination: Secondary | ICD-10-CM | POA: Diagnosis not present

## 2016-10-26 DIAGNOSIS — M542 Cervicalgia: Secondary | ICD-10-CM | POA: Diagnosis not present

## 2016-10-28 DIAGNOSIS — Z9181 History of falling: Secondary | ICD-10-CM | POA: Diagnosis not present

## 2016-10-28 DIAGNOSIS — M542 Cervicalgia: Secondary | ICD-10-CM | POA: Diagnosis not present

## 2016-10-28 DIAGNOSIS — R296 Repeated falls: Secondary | ICD-10-CM | POA: Diagnosis not present

## 2016-10-28 DIAGNOSIS — R278 Other lack of coordination: Secondary | ICD-10-CM | POA: Diagnosis not present

## 2016-10-31 DIAGNOSIS — R296 Repeated falls: Secondary | ICD-10-CM | POA: Diagnosis not present

## 2016-10-31 DIAGNOSIS — Z9181 History of falling: Secondary | ICD-10-CM | POA: Diagnosis not present

## 2016-10-31 DIAGNOSIS — R278 Other lack of coordination: Secondary | ICD-10-CM | POA: Diagnosis not present

## 2016-10-31 DIAGNOSIS — M542 Cervicalgia: Secondary | ICD-10-CM | POA: Diagnosis not present

## 2016-11-01 DIAGNOSIS — R296 Repeated falls: Secondary | ICD-10-CM | POA: Diagnosis not present

## 2016-11-01 DIAGNOSIS — R278 Other lack of coordination: Secondary | ICD-10-CM | POA: Diagnosis not present

## 2016-11-01 DIAGNOSIS — M542 Cervicalgia: Secondary | ICD-10-CM | POA: Diagnosis not present

## 2016-11-01 DIAGNOSIS — Z9181 History of falling: Secondary | ICD-10-CM | POA: Diagnosis not present

## 2016-11-04 DIAGNOSIS — M542 Cervicalgia: Secondary | ICD-10-CM | POA: Diagnosis not present

## 2016-11-04 DIAGNOSIS — R296 Repeated falls: Secondary | ICD-10-CM | POA: Diagnosis not present

## 2016-11-04 DIAGNOSIS — Z9181 History of falling: Secondary | ICD-10-CM | POA: Diagnosis not present

## 2016-11-04 DIAGNOSIS — R278 Other lack of coordination: Secondary | ICD-10-CM | POA: Diagnosis not present

## 2016-11-08 DIAGNOSIS — R278 Other lack of coordination: Secondary | ICD-10-CM | POA: Diagnosis not present

## 2016-11-08 DIAGNOSIS — R296 Repeated falls: Secondary | ICD-10-CM | POA: Diagnosis not present

## 2016-11-08 DIAGNOSIS — M542 Cervicalgia: Secondary | ICD-10-CM | POA: Diagnosis not present

## 2016-11-08 DIAGNOSIS — Z9181 History of falling: Secondary | ICD-10-CM | POA: Diagnosis not present

## 2016-11-09 DIAGNOSIS — Z9181 History of falling: Secondary | ICD-10-CM | POA: Diagnosis not present

## 2016-11-09 DIAGNOSIS — R278 Other lack of coordination: Secondary | ICD-10-CM | POA: Diagnosis not present

## 2016-11-09 DIAGNOSIS — R296 Repeated falls: Secondary | ICD-10-CM | POA: Diagnosis not present

## 2016-11-09 DIAGNOSIS — M542 Cervicalgia: Secondary | ICD-10-CM | POA: Diagnosis not present

## 2016-11-10 DIAGNOSIS — R278 Other lack of coordination: Secondary | ICD-10-CM | POA: Diagnosis not present

## 2016-11-10 DIAGNOSIS — Z9181 History of falling: Secondary | ICD-10-CM | POA: Diagnosis not present

## 2016-11-10 DIAGNOSIS — M542 Cervicalgia: Secondary | ICD-10-CM | POA: Diagnosis not present

## 2016-11-10 DIAGNOSIS — Z8781 Personal history of (healed) traumatic fracture: Secondary | ICD-10-CM | POA: Diagnosis not present

## 2016-11-10 DIAGNOSIS — M87051 Idiopathic aseptic necrosis of right femur: Secondary | ICD-10-CM | POA: Diagnosis not present

## 2016-11-10 DIAGNOSIS — R296 Repeated falls: Secondary | ICD-10-CM | POA: Diagnosis not present

## 2016-11-14 DIAGNOSIS — M542 Cervicalgia: Secondary | ICD-10-CM | POA: Diagnosis not present

## 2016-11-14 DIAGNOSIS — R278 Other lack of coordination: Secondary | ICD-10-CM | POA: Diagnosis not present

## 2016-11-14 DIAGNOSIS — Z9181 History of falling: Secondary | ICD-10-CM | POA: Diagnosis not present

## 2016-11-14 DIAGNOSIS — R296 Repeated falls: Secondary | ICD-10-CM | POA: Diagnosis not present

## 2016-11-15 ENCOUNTER — Encounter: Payer: Self-pay | Admitting: Internal Medicine

## 2016-11-15 DIAGNOSIS — M1611 Unilateral primary osteoarthritis, right hip: Secondary | ICD-10-CM | POA: Diagnosis not present

## 2016-11-15 DIAGNOSIS — Z4789 Encounter for other orthopedic aftercare: Secondary | ICD-10-CM | POA: Diagnosis not present

## 2016-11-16 DIAGNOSIS — R296 Repeated falls: Secondary | ICD-10-CM | POA: Diagnosis not present

## 2016-11-16 DIAGNOSIS — M542 Cervicalgia: Secondary | ICD-10-CM | POA: Diagnosis not present

## 2016-11-16 DIAGNOSIS — Z9181 History of falling: Secondary | ICD-10-CM | POA: Diagnosis not present

## 2016-11-16 DIAGNOSIS — R278 Other lack of coordination: Secondary | ICD-10-CM | POA: Diagnosis not present

## 2016-11-18 DIAGNOSIS — R296 Repeated falls: Secondary | ICD-10-CM | POA: Diagnosis not present

## 2016-11-18 DIAGNOSIS — R278 Other lack of coordination: Secondary | ICD-10-CM | POA: Diagnosis not present

## 2016-11-18 DIAGNOSIS — M542 Cervicalgia: Secondary | ICD-10-CM | POA: Diagnosis not present

## 2016-11-18 DIAGNOSIS — Z9181 History of falling: Secondary | ICD-10-CM | POA: Diagnosis not present

## 2016-11-21 DIAGNOSIS — Z9181 History of falling: Secondary | ICD-10-CM | POA: Diagnosis not present

## 2016-11-21 DIAGNOSIS — M542 Cervicalgia: Secondary | ICD-10-CM | POA: Diagnosis not present

## 2016-11-21 DIAGNOSIS — R278 Other lack of coordination: Secondary | ICD-10-CM | POA: Diagnosis not present

## 2016-11-21 DIAGNOSIS — R296 Repeated falls: Secondary | ICD-10-CM | POA: Diagnosis not present

## 2016-11-23 DIAGNOSIS — R278 Other lack of coordination: Secondary | ICD-10-CM | POA: Diagnosis not present

## 2016-11-23 DIAGNOSIS — Z9181 History of falling: Secondary | ICD-10-CM | POA: Diagnosis not present

## 2016-11-23 DIAGNOSIS — R296 Repeated falls: Secondary | ICD-10-CM | POA: Diagnosis not present

## 2016-11-23 DIAGNOSIS — M542 Cervicalgia: Secondary | ICD-10-CM | POA: Diagnosis not present

## 2016-11-24 DIAGNOSIS — Z9181 History of falling: Secondary | ICD-10-CM | POA: Diagnosis not present

## 2016-11-24 DIAGNOSIS — M542 Cervicalgia: Secondary | ICD-10-CM | POA: Diagnosis not present

## 2016-11-24 DIAGNOSIS — R278 Other lack of coordination: Secondary | ICD-10-CM | POA: Diagnosis not present

## 2016-11-24 DIAGNOSIS — R296 Repeated falls: Secondary | ICD-10-CM | POA: Diagnosis not present

## 2016-11-30 ENCOUNTER — Encounter: Payer: Self-pay | Admitting: Internal Medicine

## 2016-11-30 DIAGNOSIS — Z9889 Other specified postprocedural states: Secondary | ICD-10-CM | POA: Diagnosis not present

## 2016-11-30 DIAGNOSIS — Z8781 Personal history of (healed) traumatic fracture: Secondary | ICD-10-CM | POA: Diagnosis not present

## 2016-11-30 DIAGNOSIS — M1611 Unilateral primary osteoarthritis, right hip: Secondary | ICD-10-CM | POA: Diagnosis not present

## 2016-11-30 DIAGNOSIS — M87051 Idiopathic aseptic necrosis of right femur: Secondary | ICD-10-CM | POA: Diagnosis not present

## 2016-12-01 DIAGNOSIS — M542 Cervicalgia: Secondary | ICD-10-CM | POA: Diagnosis not present

## 2016-12-01 DIAGNOSIS — R296 Repeated falls: Secondary | ICD-10-CM | POA: Diagnosis not present

## 2016-12-01 DIAGNOSIS — R278 Other lack of coordination: Secondary | ICD-10-CM | POA: Diagnosis not present

## 2016-12-01 DIAGNOSIS — Z9181 History of falling: Secondary | ICD-10-CM | POA: Diagnosis not present

## 2016-12-03 DIAGNOSIS — M542 Cervicalgia: Secondary | ICD-10-CM | POA: Diagnosis not present

## 2016-12-03 DIAGNOSIS — R278 Other lack of coordination: Secondary | ICD-10-CM | POA: Diagnosis not present

## 2016-12-03 DIAGNOSIS — R296 Repeated falls: Secondary | ICD-10-CM | POA: Diagnosis not present

## 2016-12-03 DIAGNOSIS — Z9181 History of falling: Secondary | ICD-10-CM | POA: Diagnosis not present

## 2016-12-05 DIAGNOSIS — R278 Other lack of coordination: Secondary | ICD-10-CM | POA: Diagnosis not present

## 2016-12-05 DIAGNOSIS — Z9181 History of falling: Secondary | ICD-10-CM | POA: Diagnosis not present

## 2016-12-05 DIAGNOSIS — M542 Cervicalgia: Secondary | ICD-10-CM | POA: Diagnosis not present

## 2016-12-05 DIAGNOSIS — R296 Repeated falls: Secondary | ICD-10-CM | POA: Diagnosis not present

## 2016-12-07 DIAGNOSIS — M542 Cervicalgia: Secondary | ICD-10-CM | POA: Diagnosis not present

## 2016-12-07 DIAGNOSIS — Z9181 History of falling: Secondary | ICD-10-CM | POA: Diagnosis not present

## 2016-12-07 DIAGNOSIS — R278 Other lack of coordination: Secondary | ICD-10-CM | POA: Diagnosis not present

## 2016-12-07 DIAGNOSIS — R296 Repeated falls: Secondary | ICD-10-CM | POA: Diagnosis not present

## 2016-12-09 DIAGNOSIS — R296 Repeated falls: Secondary | ICD-10-CM | POA: Diagnosis not present

## 2016-12-09 DIAGNOSIS — R278 Other lack of coordination: Secondary | ICD-10-CM | POA: Diagnosis not present

## 2016-12-09 DIAGNOSIS — Z9181 History of falling: Secondary | ICD-10-CM | POA: Diagnosis not present

## 2016-12-09 DIAGNOSIS — M542 Cervicalgia: Secondary | ICD-10-CM | POA: Diagnosis not present

## 2016-12-12 DIAGNOSIS — R296 Repeated falls: Secondary | ICD-10-CM | POA: Diagnosis not present

## 2016-12-12 DIAGNOSIS — M542 Cervicalgia: Secondary | ICD-10-CM | POA: Diagnosis not present

## 2016-12-12 DIAGNOSIS — R278 Other lack of coordination: Secondary | ICD-10-CM | POA: Diagnosis not present

## 2016-12-12 DIAGNOSIS — Z9181 History of falling: Secondary | ICD-10-CM | POA: Diagnosis not present

## 2016-12-13 DIAGNOSIS — M542 Cervicalgia: Secondary | ICD-10-CM | POA: Diagnosis not present

## 2016-12-13 DIAGNOSIS — Z9181 History of falling: Secondary | ICD-10-CM | POA: Diagnosis not present

## 2016-12-13 DIAGNOSIS — R296 Repeated falls: Secondary | ICD-10-CM | POA: Diagnosis not present

## 2016-12-13 DIAGNOSIS — R278 Other lack of coordination: Secondary | ICD-10-CM | POA: Diagnosis not present

## 2016-12-15 DIAGNOSIS — M542 Cervicalgia: Secondary | ICD-10-CM | POA: Diagnosis not present

## 2016-12-15 DIAGNOSIS — R278 Other lack of coordination: Secondary | ICD-10-CM | POA: Diagnosis not present

## 2016-12-15 DIAGNOSIS — R296 Repeated falls: Secondary | ICD-10-CM | POA: Diagnosis not present

## 2016-12-15 DIAGNOSIS — Z9181 History of falling: Secondary | ICD-10-CM | POA: Diagnosis not present

## 2016-12-19 DIAGNOSIS — R278 Other lack of coordination: Secondary | ICD-10-CM | POA: Diagnosis not present

## 2016-12-19 DIAGNOSIS — Z9181 History of falling: Secondary | ICD-10-CM | POA: Diagnosis not present

## 2016-12-19 DIAGNOSIS — M542 Cervicalgia: Secondary | ICD-10-CM | POA: Diagnosis not present

## 2016-12-19 DIAGNOSIS — R296 Repeated falls: Secondary | ICD-10-CM | POA: Diagnosis not present

## 2016-12-20 DIAGNOSIS — M1611 Unilateral primary osteoarthritis, right hip: Secondary | ICD-10-CM | POA: Diagnosis not present

## 2016-12-20 DIAGNOSIS — M87051 Idiopathic aseptic necrosis of right femur: Secondary | ICD-10-CM | POA: Diagnosis not present

## 2016-12-21 ENCOUNTER — Encounter: Payer: Self-pay | Admitting: Internal Medicine

## 2016-12-21 ENCOUNTER — Non-Acute Institutional Stay: Payer: Medicare Other | Admitting: Internal Medicine

## 2016-12-21 VITALS — BP 120/68 | HR 83 | Temp 98.3°F | Wt 102.0 lb

## 2016-12-21 DIAGNOSIS — M4802 Spinal stenosis, cervical region: Secondary | ICD-10-CM | POA: Diagnosis not present

## 2016-12-21 DIAGNOSIS — Z9181 History of falling: Secondary | ICD-10-CM | POA: Diagnosis not present

## 2016-12-21 DIAGNOSIS — R278 Other lack of coordination: Secondary | ICD-10-CM | POA: Diagnosis not present

## 2016-12-21 DIAGNOSIS — F0281 Dementia in other diseases classified elsewhere with behavioral disturbance: Secondary | ICD-10-CM

## 2016-12-21 DIAGNOSIS — G301 Alzheimer's disease with late onset: Secondary | ICD-10-CM

## 2016-12-21 DIAGNOSIS — R2681 Unsteadiness on feet: Secondary | ICD-10-CM | POA: Diagnosis not present

## 2016-12-21 DIAGNOSIS — M542 Cervicalgia: Secondary | ICD-10-CM | POA: Diagnosis not present

## 2016-12-21 DIAGNOSIS — F02818 Dementia in other diseases classified elsewhere, unspecified severity, with other behavioral disturbance: Secondary | ICD-10-CM

## 2016-12-21 DIAGNOSIS — R296 Repeated falls: Secondary | ICD-10-CM | POA: Diagnosis not present

## 2016-12-21 MED ORDER — DICLOFENAC SODIUM 1 % TD GEL: 2.0000 g | Freq: Four times a day (QID) | TRANSDERMAL | 12 refills | Status: DC

## 2016-12-21 MED ORDER — MEMANTINE HCL ER 7 MG PO CP24: 7.0000 mg | ORAL_CAPSULE | Freq: Every day | ORAL | 5 refills | Status: DC

## 2016-12-21 NOTE — Progress Notes (Signed)
Location:  [facility]up[facility] psychologist of Service:  Clinic (12)  Provider: Ahamed Hofland L. Mariea Clonts, D.O., C.M.D.  Code Status: DNR Goals of Care:  Advanced Directives 12/21/2016  Does Patient Have a Medical Advance Directive? Yes  Type of Advance Directive Out of facility DNR (pink MOST or yellow form);[place];Living will  Does patient want to make changes to medical advance directive? -  Copy of [place] in Chart? Yes  Would patient like information on creating a medical advance directive? -  Pre-existing out of facility DNR order (yellow form or pink MOST form) Yellow form placed in chart (order not valid for inpatient use)   Chief Complaint  Patient presents with  . Medical Management of Chronic Issues    93mth follow-up    HPI: Patient is a 83 y.o. female seen today for medical management of chronic diseases.    Has tylenol q 6 hrs for pain in right hip.  Can't get her leg up to put her right leg up.  Right hip hurts in groin area.    Has her cervical stenosis. Magda Paganini from OT is working with her on it.  Has a neck support.    Says she was allergic to the cream for her neck.  But, the bed bugs she had caused a rash and voltaren was held b/c it might irritate the open areas.  Pt said she had the bed bug crawling on her face and showed it to staff.    Has been using biofreeze some recently with Magda Paganini.  It feels good, but it doesn't last.   Her daughter asks about treatment for her dementia.  We reviewed options and side effects, benefits.   Past Medical History:  Diagnosis Date  . Allergic contact dermatitis   . Anxiety   . Arthritis   . Closed fracture of neck of femur ([place]) 01/25/2016  . Cystitis, interstitial   . Depression   . Femur fracture, right ([place]) 01/26/16  . Fracture of right inferior pubic ramus ([place]) 01/26/16  . Hepatitis    over 50 years ago  . Long-term use of high-risk medication   . Osteopenia   . Tremors  of nervous system   . Vertigo     Past Surgical History:  Procedure Laterality Date  . APPENDECTOMY    . DILATATION & CURETTAGE/HYSTEROSCOPY WITH MYOSURE N/A 06/26/2015   Procedure: DILATATION & CURETTAGE/HYSTEROSCOPY WITH MYOSURE;  Surgeon: Arvella Nigh, MD;  Location: [facility];  Service: Gynecology;  Laterality: N/A;  . FRACTURE SURGERY  2015   left hip fracture  . HIP ORIF W/ CAPSULOTOMY Left 10/22/2013   [name]   . RIGHT hip percutaneous pinning of impacted femoral neck fracture  01/26/16   [facility]--Dr. Jaynee Eagles orthopedics  . TONSILLECTOMY      Allergies  Allergen Reactions  . Ham Other (See Comments)  . Latex Rash  . Other Other (See Comments)    Pt reports was seen by neurologist for tremors and was allergic to all medications prescribed for tremors, but does not know names or reaction. Patient reports being allergic to polyester. Does not know what reaction would be. States she was seen by a doctor who tested her for multiple allergies and she had positive results.  . Ciprofloxacin Other (See Comments)    Doesn't work  . Doxycycline Other (See Comments)    unknown  . Erythromycin     Noted from Neurologist in [place]  . Hydrocortisone   .  Levaquin [Levofloxacin In D5w] Other (See Comments)    unknown  . Lopid [Gemfibrozil]   . Macrobid [employer] Macro] Other (See Comments)    Doesn't work  . Sulfa Antibiotics Other (See Comments)    unknown    Allergies as of 12/21/2016      Reactions   Ham Other (See Comments)   Latex Rash   Other Other (See Comments)   Pt reports was seen by neurologist for tremors and was allergic to all medications prescribed for tremors, but does not know names or reaction. Patient reports being allergic to polyester. Does not know what reaction would be. States she was seen by a doctor who tested her for multiple allergies and she had positive results.   Ciprofloxacin Other (See Comments)   Doesn't work   Doxycycline  Other (See Comments)   unknown   Erythromycin    Noted from Neurologist in [place]   Hydrocortisone    Levaquin [levofloxacin In D5w] Other (See Comments)   unknown   Lopid [gemfibrozil]    Macrobid [nitrofurantoin Monohyd Macro] Other (See Comments)   Doesn't work   Sulfa Antibiotics Other (See Comments)   unknown      Medication List       Accurate as of 12/21/16  3:49 PM. Always use your most recent med list.          acetaminophen 325 MG tablet Commonly known as:  TYLENOL Take 650 mg by mouth every 6 (six) hours as needed (do not exceed 3 gm of APAP in 24 hours including tylenol).   cephALEXin 250 MG capsule Commonly known as:  KEFLEX Take 250 mg by mouth daily before lunch.   citalopram 20 MG tablet Commonly known as:  CELEXA Take 20 mg by mouth daily.   D3 HIGH POTENCY 1000 units capsule Generic drug:  Cholecalciferol Take 1,000 Units by mouth daily. Take one tablet daily   diclofenac sodium 1 % Gel Commonly known as:  VOLTAREN Apply 2 g topically 4 (four) times daily. Apply for neck pain.   docusate sodium 100 MG capsule Commonly known as:  COLACE Take 200 mg by mouth daily.   feeding supplement Liqd Take 1 Container by mouth daily.   fluocinolone 0.01 % cream Commonly known as:  VANOS Apply 1 application topically 2 (two) times daily as needed (skin rash).   LORazepam 0.5 MG tablet Commonly known as:  ATIVAN Take 0.5 mg by mouth as needed (before procedures).   MYRBETRIQ 50 MG Tb24 tablet Generic drug:  mirabegron ER Take 50 mg by mouth daily.   raloxifene 60 MG tablet Commonly known as:  EVISTA Take 1 tablet (60 mg total) by mouth daily. Take one table daily   SYSTANE OP Place 1 drop into both eyes 4 (four) times daily.   vitamin B-12 1000 MCG tablet Commonly known as:  CYANOCOBALAMIN Take 1,000 mcg by mouth every other day.       Review of Systems:  Review of Systems  Constitutional: Negative for chills and fever.  HENT: Positive  for hearing loss. Negative for congestion.   Eyes: Negative for blurred vision.  Respiratory: Negative for shortness of breath.   Cardiovascular: Negative for chest pain, palpitations and leg swelling.  Genitourinary: Positive for urgency. Negative for dysuria and frequency.       Leakage apparently b/c wears cotton pads  Musculoskeletal: Positive for joint pain, myalgias and neck pain. Negative for falls.  Skin: Positive for itching.  Had bed bugs  Neurological: Positive for tingling and sensory change. Negative for dizziness and loss of consciousness.  Endo/Heme/Allergies: Bruises/bleeds easily.  Psychiatric/Behavioral: Negative for depression and memory loss. The patient is not nervous/anxious.     Health Maintenance  Topic Date Due  . DEXA SCAN  07/20/1996  . TETANUS/TDAP  08/29/2018 (Originally 07/20/1948)  . PNA vac Low Risk Adult (2 of 2 - PPSV23) 08/29/2018 (Originally 09/28/2015)  . INFLUENZA VACCINE  03/29/2017    Physical Exam: Vitals:   12/21/16 1536  BP: 120/68  Pulse: 83  Temp: 98.3 F (36.8 C)  TempSrc: Oral  SpO2: 95%  Weight: 102 lb (46.3 kg)   Body mass index is 19.92 kg/m. Physical Exam  Constitutional: She appears well-developed and well-nourished. No distress.  HENT:  Head: Normocephalic and atraumatic.  Cardiovascular: Normal rate, regular rhythm, normal heart sounds and intact distal pulses.   Pulmonary/Chest: Effort normal and breath sounds normal. No respiratory distress.  Abdominal: Bowel sounds are normal.  Musculoskeletal: She exhibits tenderness.  Right groin and hip tenderness; right neck paravertebral muscle pain and trapezius muscle pain  Neurological: She is alert.  Oriented to person and that she's at [place]  Skin: Skin is warm and dry. Capillary refill takes less than 2 seconds.  Psychiatric: She has a normal mood and affect.    Labs reviewed: Basic Metabolic Panel:  Recent Labs  01/28/16 03/04/16 0400 08/23/16 1434    NA 140  --  137  K 4.4  --  4.6  CL  --   --  104  CO2  --   --  26  GLUCOSE  --   --  101*  BUN 26*  --  22  CREATININE 1.2*  --  1.13*  CALCIUM  --   --  9.1  TSH  --  2.19  --    Liver Function Tests:  Recent Labs  08/23/16 1434  AST 21  ALT 7  ALKPHOS 49  BILITOT 0.5  PROT 7.0  ALBUMIN 4.2   No results for input(s): LIPASE, AMYLASE in the last 8760 hours. No results for input(s): AMMONIA in the last 8760 hours. CBC:  Recent Labs  01/28/16 02/01/16 0500 08/23/16 1434  WBC 6.7 5.3 5.8  NEUTROABS  --   --  3,016  HGB 11.8* 12.5 13.7  HCT 37 42 42.1  MCV  --   --  94.0  PLT 134* 184 210   Dr. Tawanna Sat notes reviewed  Assessment/Plan 1. Cervical spinal stenosis Tylenol 650mg  po qid scheduled for right hip pain voltaren gel 2g qid to right neck and 4g qid to right hip biofreeze with OT and stretches  2. Late onset Alzheimer's disease with behavioral disturbance namenda XR 7mg  po daily starting today and f/u in 6 weeks  3. Unsteady gait Ongoing, cont use of walker, unable to wash her feet on her own so will ask for assistance with showers by CNA--she does not really want this but needs it for safety  Labs/tests ordered:  No orders of the defined types were placed in this encounter.  Next appt:  02/01/2017  Lauranne Beyersdorf L. Kelcy Baeten, D.O. [facility] [address], [place] [address] Cell Phone (Mon-Fri 8am-5pm):  [phone] On Call:  [phone] & follow prompts after 5pm & weekends Office Phone:  [phone] Office Fax:  [phone]

## 2016-12-23 DIAGNOSIS — Z9181 History of falling: Secondary | ICD-10-CM | POA: Diagnosis not present

## 2016-12-23 DIAGNOSIS — M542 Cervicalgia: Secondary | ICD-10-CM | POA: Diagnosis not present

## 2016-12-23 DIAGNOSIS — R296 Repeated falls: Secondary | ICD-10-CM | POA: Diagnosis not present

## 2016-12-23 DIAGNOSIS — R278 Other lack of coordination: Secondary | ICD-10-CM | POA: Diagnosis not present

## 2016-12-26 DIAGNOSIS — M542 Cervicalgia: Secondary | ICD-10-CM | POA: Diagnosis not present

## 2016-12-26 DIAGNOSIS — R296 Repeated falls: Secondary | ICD-10-CM | POA: Diagnosis not present

## 2016-12-26 DIAGNOSIS — R278 Other lack of coordination: Secondary | ICD-10-CM | POA: Diagnosis not present

## 2016-12-26 DIAGNOSIS — Z9181 History of falling: Secondary | ICD-10-CM | POA: Diagnosis not present

## 2016-12-28 DIAGNOSIS — M542 Cervicalgia: Secondary | ICD-10-CM | POA: Diagnosis not present

## 2016-12-28 DIAGNOSIS — Z9181 History of falling: Secondary | ICD-10-CM | POA: Diagnosis not present

## 2016-12-28 DIAGNOSIS — R278 Other lack of coordination: Secondary | ICD-10-CM | POA: Diagnosis not present

## 2016-12-28 DIAGNOSIS — R296 Repeated falls: Secondary | ICD-10-CM | POA: Diagnosis not present

## 2016-12-30 DIAGNOSIS — M542 Cervicalgia: Secondary | ICD-10-CM | POA: Diagnosis not present

## 2016-12-30 DIAGNOSIS — Z9181 History of falling: Secondary | ICD-10-CM | POA: Diagnosis not present

## 2016-12-30 DIAGNOSIS — R296 Repeated falls: Secondary | ICD-10-CM | POA: Diagnosis not present

## 2016-12-30 DIAGNOSIS — R278 Other lack of coordination: Secondary | ICD-10-CM | POA: Diagnosis not present

## 2017-01-02 DIAGNOSIS — Z9181 History of falling: Secondary | ICD-10-CM | POA: Diagnosis not present

## 2017-01-02 DIAGNOSIS — R296 Repeated falls: Secondary | ICD-10-CM | POA: Diagnosis not present

## 2017-01-02 DIAGNOSIS — R278 Other lack of coordination: Secondary | ICD-10-CM | POA: Diagnosis not present

## 2017-01-02 DIAGNOSIS — M542 Cervicalgia: Secondary | ICD-10-CM | POA: Diagnosis not present

## 2017-01-03 DIAGNOSIS — Z9181 History of falling: Secondary | ICD-10-CM | POA: Diagnosis not present

## 2017-01-03 DIAGNOSIS — M542 Cervicalgia: Secondary | ICD-10-CM | POA: Diagnosis not present

## 2017-01-03 DIAGNOSIS — R296 Repeated falls: Secondary | ICD-10-CM | POA: Diagnosis not present

## 2017-01-03 DIAGNOSIS — R278 Other lack of coordination: Secondary | ICD-10-CM | POA: Diagnosis not present

## 2017-01-06 DIAGNOSIS — R296 Repeated falls: Secondary | ICD-10-CM | POA: Diagnosis not present

## 2017-01-06 DIAGNOSIS — Z9181 History of falling: Secondary | ICD-10-CM | POA: Diagnosis not present

## 2017-01-06 DIAGNOSIS — R278 Other lack of coordination: Secondary | ICD-10-CM | POA: Diagnosis not present

## 2017-01-06 DIAGNOSIS — M542 Cervicalgia: Secondary | ICD-10-CM | POA: Diagnosis not present

## 2017-01-09 DIAGNOSIS — R278 Other lack of coordination: Secondary | ICD-10-CM | POA: Diagnosis not present

## 2017-01-09 DIAGNOSIS — M542 Cervicalgia: Secondary | ICD-10-CM | POA: Diagnosis not present

## 2017-01-09 DIAGNOSIS — Z9181 History of falling: Secondary | ICD-10-CM | POA: Diagnosis not present

## 2017-01-09 DIAGNOSIS — R296 Repeated falls: Secondary | ICD-10-CM | POA: Diagnosis not present

## 2017-01-11 DIAGNOSIS — R278 Other lack of coordination: Secondary | ICD-10-CM | POA: Diagnosis not present

## 2017-01-11 DIAGNOSIS — Z9181 History of falling: Secondary | ICD-10-CM | POA: Diagnosis not present

## 2017-01-11 DIAGNOSIS — R296 Repeated falls: Secondary | ICD-10-CM | POA: Diagnosis not present

## 2017-01-11 DIAGNOSIS — M542 Cervicalgia: Secondary | ICD-10-CM | POA: Diagnosis not present

## 2017-01-13 DIAGNOSIS — Z9181 History of falling: Secondary | ICD-10-CM | POA: Diagnosis not present

## 2017-01-13 DIAGNOSIS — R296 Repeated falls: Secondary | ICD-10-CM | POA: Diagnosis not present

## 2017-01-13 DIAGNOSIS — R278 Other lack of coordination: Secondary | ICD-10-CM | POA: Diagnosis not present

## 2017-01-13 DIAGNOSIS — M542 Cervicalgia: Secondary | ICD-10-CM | POA: Diagnosis not present

## 2017-01-16 DIAGNOSIS — M542 Cervicalgia: Secondary | ICD-10-CM | POA: Diagnosis not present

## 2017-01-16 DIAGNOSIS — Z9181 History of falling: Secondary | ICD-10-CM | POA: Diagnosis not present

## 2017-01-16 DIAGNOSIS — R278 Other lack of coordination: Secondary | ICD-10-CM | POA: Diagnosis not present

## 2017-01-16 DIAGNOSIS — R296 Repeated falls: Secondary | ICD-10-CM | POA: Diagnosis not present

## 2017-01-17 DIAGNOSIS — Z9181 History of falling: Secondary | ICD-10-CM | POA: Diagnosis not present

## 2017-01-17 DIAGNOSIS — M1611 Unilateral primary osteoarthritis, right hip: Secondary | ICD-10-CM | POA: Diagnosis not present

## 2017-01-17 DIAGNOSIS — R278 Other lack of coordination: Secondary | ICD-10-CM | POA: Diagnosis not present

## 2017-01-17 DIAGNOSIS — M87051 Idiopathic aseptic necrosis of right femur: Secondary | ICD-10-CM | POA: Diagnosis not present

## 2017-01-17 DIAGNOSIS — R296 Repeated falls: Secondary | ICD-10-CM | POA: Diagnosis not present

## 2017-01-17 DIAGNOSIS — M542 Cervicalgia: Secondary | ICD-10-CM | POA: Diagnosis not present

## 2017-01-20 DIAGNOSIS — M542 Cervicalgia: Secondary | ICD-10-CM | POA: Diagnosis not present

## 2017-01-20 DIAGNOSIS — R296 Repeated falls: Secondary | ICD-10-CM | POA: Diagnosis not present

## 2017-01-20 DIAGNOSIS — R278 Other lack of coordination: Secondary | ICD-10-CM | POA: Diagnosis not present

## 2017-01-20 DIAGNOSIS — Z9181 History of falling: Secondary | ICD-10-CM | POA: Diagnosis not present

## 2017-01-23 DIAGNOSIS — M542 Cervicalgia: Secondary | ICD-10-CM | POA: Diagnosis not present

## 2017-01-23 DIAGNOSIS — Z9181 History of falling: Secondary | ICD-10-CM | POA: Diagnosis not present

## 2017-01-23 DIAGNOSIS — R296 Repeated falls: Secondary | ICD-10-CM | POA: Diagnosis not present

## 2017-01-23 DIAGNOSIS — R278 Other lack of coordination: Secondary | ICD-10-CM | POA: Diagnosis not present

## 2017-01-24 DIAGNOSIS — R296 Repeated falls: Secondary | ICD-10-CM | POA: Diagnosis not present

## 2017-01-24 DIAGNOSIS — M542 Cervicalgia: Secondary | ICD-10-CM | POA: Diagnosis not present

## 2017-01-24 DIAGNOSIS — R278 Other lack of coordination: Secondary | ICD-10-CM | POA: Diagnosis not present

## 2017-01-24 DIAGNOSIS — Z9181 History of falling: Secondary | ICD-10-CM | POA: Diagnosis not present

## 2017-01-26 DIAGNOSIS — R296 Repeated falls: Secondary | ICD-10-CM | POA: Diagnosis not present

## 2017-01-26 DIAGNOSIS — R278 Other lack of coordination: Secondary | ICD-10-CM | POA: Diagnosis not present

## 2017-01-26 DIAGNOSIS — Z9181 History of falling: Secondary | ICD-10-CM | POA: Diagnosis not present

## 2017-01-26 DIAGNOSIS — M542 Cervicalgia: Secondary | ICD-10-CM | POA: Diagnosis not present

## 2017-01-30 DIAGNOSIS — R296 Repeated falls: Secondary | ICD-10-CM | POA: Diagnosis not present

## 2017-01-30 DIAGNOSIS — M542 Cervicalgia: Secondary | ICD-10-CM | POA: Diagnosis not present

## 2017-01-30 DIAGNOSIS — Z9181 History of falling: Secondary | ICD-10-CM | POA: Diagnosis not present

## 2017-01-30 DIAGNOSIS — R278 Other lack of coordination: Secondary | ICD-10-CM | POA: Diagnosis not present

## 2017-02-01 ENCOUNTER — Non-Acute Institutional Stay: Payer: Medicare Other | Admitting: Internal Medicine

## 2017-02-01 ENCOUNTER — Encounter: Payer: Self-pay | Admitting: Internal Medicine

## 2017-02-01 VITALS — BP 128/80 | HR 80 | Temp 98.4°F | Wt 103.0 lb

## 2017-02-01 DIAGNOSIS — Z9181 History of falling: Secondary | ICD-10-CM | POA: Diagnosis not present

## 2017-02-01 DIAGNOSIS — R278 Other lack of coordination: Secondary | ICD-10-CM | POA: Diagnosis not present

## 2017-02-01 DIAGNOSIS — M810 Age-related osteoporosis without current pathological fracture: Secondary | ICD-10-CM

## 2017-02-01 DIAGNOSIS — R636 Underweight: Secondary | ICD-10-CM | POA: Diagnosis not present

## 2017-02-01 DIAGNOSIS — F0281 Dementia in other diseases classified elsewhere with behavioral disturbance: Secondary | ICD-10-CM

## 2017-02-01 DIAGNOSIS — F422 Mixed obsessional thoughts and acts: Secondary | ICD-10-CM

## 2017-02-01 DIAGNOSIS — M542 Cervicalgia: Secondary | ICD-10-CM | POA: Diagnosis not present

## 2017-02-01 DIAGNOSIS — G301 Alzheimer's disease with late onset: Secondary | ICD-10-CM | POA: Diagnosis not present

## 2017-02-01 DIAGNOSIS — R296 Repeated falls: Secondary | ICD-10-CM | POA: Diagnosis not present

## 2017-02-01 DIAGNOSIS — M1611 Unilateral primary osteoarthritis, right hip: Secondary | ICD-10-CM | POA: Diagnosis not present

## 2017-02-01 DIAGNOSIS — N3281 Overactive bladder: Secondary | ICD-10-CM | POA: Diagnosis not present

## 2017-02-01 DIAGNOSIS — F02818 Dementia in other diseases classified elsewhere, unspecified severity, with other behavioral disturbance: Secondary | ICD-10-CM

## 2017-02-01 NOTE — Progress Notes (Signed)
Location:  Occupational psychologist of Service:  Clinic (12)  Provider: Raudel Bazen L. Mariea Clonts, D.O., C.M.D.  Code Status: DNR Goals of Care:  Advanced Directives 02/01/2017  Does Patient Have a Medical Advance Directive? Yes  Type of Advance Directive Out of facility DNR (pink MOST or yellow form);[place];Living will  Does patient want to make changes to medical advance directive? -  Copy of [place] in Chart? Yes  Would patient like information on creating a medical advance directive? -  Pre-existing out of facility DNR order (yellow form or pink MOST form) Yellow form placed in chart (order not valid for inpatient use)     Chief Complaint  Patient presents with  . Medical Management of Chronic Issues    6 week follow-up    HPI: Patient is a 83 y.o. female seen today for medical management of chronic diseases.    Right hip pain worse, she reports.  See email from her daughter. Says she can't move in bed well b/c of the pain.  It's in her right groin.  Helped with biofreeze.  Says her pain limits her from getting up and going to the dining room.  Says her pain was worse after the doctor's appt and walking a lot.    No incontinence.  Is usually getting to the restroom in time.  On myrbetriq.  Bowels are moving ok with miralax and colace.  Osteoporosis on evista.  Density had bene stable.    Anxiety/OCD:  Has her daily lorazepam as needed.  Fixated on her hip pain.  On celexa also.    Not drinking boost (chocolate)--says she doesn't like it.  Discussed adding ice cream to it.    Past Medical History:  Diagnosis Date  . Allergic contact dermatitis   . Anxiety   . Arthritis   . Closed fracture of neck of femur ([place]) 01/25/2016  . Cystitis, interstitial   . Depression   . Femur fracture, right ([place]) 01/26/16  . Fracture of right inferior pubic ramus ([place]) 01/26/16  . Hepatitis    over 50 years ago  . Long-term use of  high-risk medication   . Osteopenia   . Tremors of nervous system   . Vertigo     Past Surgical History:  Procedure Laterality Date  . APPENDECTOMY    . DILATATION & CURETTAGE/HYSTEROSCOPY WITH MYOSURE N/A 06/26/2015   Procedure: DILATATION & CURETTAGE/HYSTEROSCOPY WITH MYOSURE;  Surgeon: Arvella Nigh, MD;  Location: [facility];  Service: Gynecology;  Laterality: N/A;  . FRACTURE SURGERY  2015   left hip fracture  . HIP ORIF W/ CAPSULOTOMY Left 10/22/2013   [name]   . RIGHT hip percutaneous pinning of impacted femoral neck fracture  01/26/16   [facility]--Dr. Jaynee Eagles orthopedics  . TONSILLECTOMY      Allergies  Allergen Reactions  . Ham Other (See Comments)  . Latex Rash  . Other Other (See Comments)    Pt reports was seen by neurologist for tremors and was allergic to all medications prescribed for tremors, but does not know names or reaction. Patient reports being allergic to polyester. Does not know what reaction would be. States she was seen by a doctor who tested her for multiple allergies and she had positive results.  . Ciprofloxacin Other (See Comments)    Doesn't work  . Doxycycline Other (See Comments)    unknown  . Erythromycin     Noted from Neurologist in [place]  .  Hydrocortisone   . Levaquin [Levofloxacin In D5w] Other (See Comments)    unknown  . Lopid [Gemfibrozil]   . Macrobid [employer] Macro] Other (See Comments)    Doesn't work  . Sulfa Antibiotics Other (See Comments)    unknown    Allergies as of 02/01/2017      Reactions   Ham Other (See Comments)   Latex Rash   Other Other (See Comments)   Pt reports was seen by neurologist for tremors and was allergic to all medications prescribed for tremors, but does not know names or reaction. Patient reports being allergic to polyester. Does not know what reaction would be. States she was seen by a doctor who tested her for multiple allergies and she had positive results.   Ciprofloxacin Other  (See Comments)   Doesn't work   Doxycycline Other (See Comments)   unknown   Erythromycin    Noted from Neurologist in [place]   Hydrocortisone    Levaquin [levofloxacin In D5w] Other (See Comments)   unknown   Lopid [gemfibrozil]    Macrobid [nitrofurantoin Monohyd Macro] Other (See Comments)   Doesn't work   Sulfa Antibiotics Other (See Comments)   unknown      Medication List       Accurate as of 02/01/17  3:51 PM. Always use your most recent med list.          acetaminophen 325 MG tablet Commonly known as:  TYLENOL Take 650 mg by mouth 4 (four) times daily.   cephALEXin 250 MG capsule Commonly known as:  KEFLEX Take 250 mg by mouth daily before lunch.   citalopram 20 MG tablet Commonly known as:  CELEXA Take 20 mg by mouth daily.   D3 HIGH POTENCY 1000 units capsule Generic drug:  Cholecalciferol Take 1,000 Units by mouth daily. Take one tablet daily   diclofenac sodium 1 % Gel Commonly known as:  VOLTAREN Apply 2 g topically 4 (four) times daily. Apply for neck pain and apply 4g qid for right hip pain   docusate sodium 100 MG capsule Commonly known as:  COLACE Take 200 mg by mouth daily.   feeding supplement Liqd Take 1 Container by mouth daily.   fluocinolone 0.01 % cream Commonly known as:  VANOS Apply 1 application topically 2 (two) times daily as needed (skin rash).   LORazepam 0.5 MG tablet Commonly known as:  ATIVAN Take 0.5 mg by mouth as needed (before procedures).   memantine 7 MG Cp24 24 hr capsule Commonly known as:  NAMENDA XR Take 1 capsule (7 mg total) by mouth daily.   MYRBETRIQ 50 MG Tb24 tablet Generic drug:  mirabegron ER Take 50 mg by mouth daily.   raloxifene 60 MG tablet Commonly known as:  EVISTA Take 1 tablet (60 mg total) by mouth daily. Take one table daily   SYSTANE OP Place 1 drop into both eyes 4 (four) times daily.   vitamin B-12 1000 MCG tablet Commonly known as:  CYANOCOBALAMIN Take 1,000 mcg by mouth every  other day.       Review of Systems:  Review of Systems  Constitutional: Positive for malaise/fatigue and weight loss. Negative for chills and fever.  HENT: Positive for hearing loss. Negative for congestion.   Eyes: Negative for blurred vision.  Respiratory: Negative for cough and shortness of breath.   Cardiovascular: Negative for chest pain, palpitations and leg swelling.  Gastrointestinal: Positive for constipation. Negative for abdominal pain, blood in stool, diarrhea and melena.  Genitourinary: Positive for urgency. Negative for dysuria and frequency.  Musculoskeletal: Positive for falls, joint pain, myalgias and neck pain.  Skin: Positive for itching and rash.       A little on neck/shoulders  Neurological: Negative for dizziness, loss of consciousness and weakness.  Endo/Heme/Allergies: Does not bruise/bleed easily.  Psychiatric/Behavioral: Positive for depression and memory loss. The patient is nervous/anxious. The patient does not have insomnia.     Health Maintenance  Topic Date Due  . DEXA SCAN  07/20/1996  . TETANUS/TDAP  08/29/2018 (Originally 07/20/1948)  . PNA vac Low Risk Adult (2 of 2 - PPSV23) 08/29/2018 (Originally 09/28/2015)  . INFLUENZA VACCINE  03/29/2017    Physical Exam: Vitals:   02/01/17 1531  BP: 128/80  Pulse: 80  Temp: 98.4 F (36.9 C)  TempSrc: Oral  SpO2: 96%  Weight: 103 lb (46.7 kg)   Body mass index is 20.12 kg/m. Physical Exam  Constitutional: No distress.  Cardiovascular: Normal rate, regular rhythm, normal heart sounds and intact distal pulses.   Pulmonary/Chest: Effort normal and breath sounds normal. No respiratory distress.  Musculoskeletal: She exhibits no tenderness.  Walks with walker, unsteady, no tenderness detected in right hip on exam when she was talking about something else  Neurological: She is alert.  Skin: Skin is warm and dry.  Psychiatric: She has a normal mood and affect.    Labs reviewed: Basic Metabolic  Panel:  Recent Labs  03/04/16 0400 08/23/16 1434  NA  --  137  K  --  4.6  CL  --  104  CO2  --  26  GLUCOSE  --  101*  BUN  --  22  CREATININE  --  1.13*  CALCIUM  --  9.1  TSH 2.19  --    Liver Function Tests:  Recent Labs  08/23/16 1434  AST 21  ALT 7  ALKPHOS 49  BILITOT 0.5  PROT 7.0  ALBUMIN 4.2   No results for input(s): LIPASE, AMYLASE in the last 8760 hours. No results for input(s): AMMONIA in the last 8760 hours. CBC:  Recent Labs  08/23/16 1434  WBC 5.8  NEUTROABS 3,016  HGB 13.7  HCT 42.1  MCV 94.0  PLT 210    Assessment/Plan 1. Primary osteoarthritis of right hip -pt expresses severe pain, but exam does not seem consistent -is obsessing with this now after declining surgery when offered by ortho -discussed risks of this at her age and dementia status -her daughter and I seem to be in agreement to defer surgery unless her function is affected which does not seem to legitimately be the case  2. Late onset Alzheimer's disease with behavioral disturbance -gradually progressing, on low dose namenda XR which can be gradually titrated up to 28mg --will increase to 14mg  daily now -she did not tolerate higher doses before so I'm being very gradual with it this time -agree with additional 2 hrs of home care in the ams as her daughter was considering  3. Mixed obsessional thoughts and acts -ongoing, now about hip, has historically been about headaches, neck aches, scabies rash, vision, etc. -hard to get pt to change subject -on celexa for this and depression/anxiety, also on lorazepam for severe anxiety episodes  4. Senile osteoporosis -ongoing, continues on evista--we previously checked her bone density and noted stability though she'd had her left hip fx -cont current regimen with vitamin D, this and weightbearing exercise   5. Underweight -ordered for staff to add ice cream to her  boost and she says she'll try it again with that  6. OAB  (overactive bladder) -cont myrbetriq, sounds like it has helped best I can tell in AL level of care  Labs/tests ordered:  No new Next appt:  05/10/2017  Tailor Westfall L. Daneil Beem, D.O. [facility] [address], [place] [address] Cell Phone (Mon-Fri 8am-5pm):  [phone] On Call:  [phone] & follow prompts after 5pm & weekends Office Phone:  [phone] Office Fax:  [phone]

## 2017-02-02 DIAGNOSIS — R278 Other lack of coordination: Secondary | ICD-10-CM | POA: Diagnosis not present

## 2017-02-02 DIAGNOSIS — Z9181 History of falling: Secondary | ICD-10-CM | POA: Diagnosis not present

## 2017-02-02 DIAGNOSIS — R296 Repeated falls: Secondary | ICD-10-CM | POA: Diagnosis not present

## 2017-02-02 DIAGNOSIS — M542 Cervicalgia: Secondary | ICD-10-CM | POA: Diagnosis not present

## 2017-02-07 DIAGNOSIS — M542 Cervicalgia: Secondary | ICD-10-CM | POA: Diagnosis not present

## 2017-02-07 DIAGNOSIS — R278 Other lack of coordination: Secondary | ICD-10-CM | POA: Diagnosis not present

## 2017-02-07 DIAGNOSIS — R296 Repeated falls: Secondary | ICD-10-CM | POA: Diagnosis not present

## 2017-02-07 DIAGNOSIS — Z9181 History of falling: Secondary | ICD-10-CM | POA: Diagnosis not present

## 2017-02-08 DIAGNOSIS — R278 Other lack of coordination: Secondary | ICD-10-CM | POA: Diagnosis not present

## 2017-02-08 DIAGNOSIS — M542 Cervicalgia: Secondary | ICD-10-CM | POA: Diagnosis not present

## 2017-02-08 DIAGNOSIS — R296 Repeated falls: Secondary | ICD-10-CM | POA: Diagnosis not present

## 2017-02-08 DIAGNOSIS — Z9181 History of falling: Secondary | ICD-10-CM | POA: Diagnosis not present

## 2017-02-14 ENCOUNTER — Non-Acute Institutional Stay: Payer: Medicare Other

## 2017-02-14 DIAGNOSIS — R296 Repeated falls: Secondary | ICD-10-CM | POA: Diagnosis not present

## 2017-02-14 DIAGNOSIS — Z0000 Encounter for general adult medical examination without abnormal findings: Secondary | ICD-10-CM

## 2017-02-14 DIAGNOSIS — Z9181 History of falling: Secondary | ICD-10-CM | POA: Diagnosis not present

## 2017-02-14 DIAGNOSIS — R278 Other lack of coordination: Secondary | ICD-10-CM | POA: Diagnosis not present

## 2017-02-14 DIAGNOSIS — M542 Cervicalgia: Secondary | ICD-10-CM | POA: Diagnosis not present

## 2017-02-14 NOTE — Progress Notes (Signed)
Subjective:   Melanie Freeman is a 83 y.o. female who presents for an Initial Medicare Annual Wellness Visit at [facility]       Objective:    Today's Vitals   02/14/17 1507  BP: 120/62  Pulse: 67  Temp: 97.4 F (36.3 C)  TempSrc: Oral  SpO2: 94%  Weight: 103 lb (46.7 kg)  Height: 5' (1.524 m)   Body mass index is 20.12 kg/m.   Current Medications (verified) Outpatient Encounter Prescriptions as of 02/14/2017  Medication Sig  . acetaminophen (TYLENOL) 325 MG tablet Take 650 mg by mouth 4 (four) times daily.  . cephALEXin (KEFLEX) 250 MG capsule Take 250 mg by mouth daily before lunch.  . Cholecalciferol (D3 HIGH POTENCY) 1000 UNITS capsule Take 1,000 Units by mouth daily. Take one tablet daily  . citalopram (CELEXA) 20 MG tablet Take 20 mg by mouth daily.  . diclofenac sodium (VOLTAREN) 1 % GEL Apply 2 g topically 4 (four) times daily. Apply for neck pain and apply 4g qid for right hip pain  . docusate sodium (COLACE) 100 MG capsule Take 200 mg by mouth daily.  . feeding supplement (BOOST HIGH PROTEIN) LIQD Take 1 Container by mouth daily.  . fluocinolone (VANOS) 0.01 % cream Apply 1 application topically 2 (two) times daily as needed (skin rash).  . LORazepam (ATIVAN) 0.5 MG tablet Take 0.5 mg by mouth as needed (before procedures).  . memantine (NAMENDA XR) 7 MG CP24 24 hr capsule Take 1 capsule (7 mg total) by mouth daily.  . mirabegron ER (MYRBETRIQ) 50 MG TB24 tablet Take 50 mg by mouth daily.  [name] Glycol-Propyl Glycol (SYSTANE OP) Place 1 drop into both eyes 4 (four) times daily.  . raloxifene (EVISTA) 60 MG tablet Take 1 tablet (60 mg total) by mouth daily. Take one table daily  . vitamin B-12 (CYANOCOBALAMIN) 1000 MCG tablet Take 1,000 mcg by mouth every other day.   No facility-administered encounter medications on file as of 02/14/2017.     Allergies (verified) Ham; Latex; Other; Ciprofloxacin; Doxycycline; Erythromycin; Hydrocortisone;  Levaquin [levofloxacin in d5w]; Lopid [gemfibrozil]; Macrobid [nitrofurantoin monohyd macro]; and Sulfa antibiotics   History: Past Medical History:  Diagnosis Date  . Allergic contact dermatitis   . Anxiety   . Arthritis   . Closed fracture of neck of femur ([place]) 01/25/2016  . Cystitis, interstitial   . Depression   . Femur fracture, right ([place]) 01/26/16  . Fracture of right inferior pubic ramus ([place]) 01/26/16  . Hepatitis    over 50 years ago  . Long-term use of high-risk medication   . Osteopenia   . Tremors of nervous system   . Vertigo    Past Surgical History:  Procedure Laterality Date  . APPENDECTOMY    . DILATATION & CURETTAGE/HYSTEROSCOPY WITH MYOSURE N/A 06/26/2015   Procedure: DILATATION & CURETTAGE/HYSTEROSCOPY WITH MYOSURE;  Surgeon: Arvella Nigh, MD;  Location: [facility];  Service: Gynecology;  Laterality: N/A;  . FRACTURE SURGERY  2015   left hip fracture  . HIP ORIF W/ CAPSULOTOMY Left 10/22/2013   [name]   . RIGHT hip percutaneous pinning of impacted femoral neck fracture  01/26/16   [facility]--Dr. Jaynee Eagles orthopedics  . TONSILLECTOMY     Family History  Problem Relation Age of Onset  . Dementia Mother   . Cancer Father        pancreatic  . Hepatitis Brother    Social History   Occupational History  .  stay at home mom    Social History Main Topics  . Smoking status: Former Smoker    Years: 5.00  . Smokeless tobacco: Never Used  . Alcohol use No  . Drug use: No  . Sexual activity: No    Tobacco Counseling Counseling given: Not Answered   Activities of Daily Living In your present state of health, do you have any difficulty performing the following activities: 02/14/2017 03/03/2016  Hearing? N N  Vision? N N  Difficulty concentrating or making decisions? N N  Walking or climbing stairs? Y Y  Dressing or bathing? Y N  Doing errands, shopping? Tempie Donning  Preparing Food and eating ? Y -  Using the Toilet? Y -  In the past six months, have you  accidently leaked urine? N -  Do you have problems with loss of bowel control? N -  Managing your Medications? Y -  Managing your Finances? Y -  Housekeeping or managing your Housekeeping? Y -  Some recent data might be hidden    Immunizations and Health Maintenance Immunization History  Administered Date(s) Administered  . Influenza Inj Mdck Quad Pf 06/16/2016  . Influenza-Unspecified 06/18/2015  . Pneumococcal Conjugate-13 09/27/2014   Health Maintenance Due  Topic Date Due  . DEXA SCAN  07/20/1996    Patient Care Team: Gayland Curry, DO as PCP - General (Geriatric Medicine) Bjorn Loser, MD as Consulting Physician (Urology)  Indicate any recent Medical Services you may have received from other than [facility] providers in the past year (date may be approximate).     Assessment:   This is a routine wellness examination for [place].   Hearing/Vision screen No exam data present  Dietary issues and exercise activities discussed: Current Exercise Habits: Structured exercise class, Type of exercise: Other - see comments (PT), Time (Minutes): 30, Frequency (Times/Week): 7, Weekly Exercise (Minutes/Week): 210, Intensity: Mild, Exercise limited by: orthopedic condition(s)  Goals    . Maintain Lifestyle          Pt will maintain lifestyle.       Depression Screen PHQ 2/9 Scores 02/14/2017 04/29/2015  PHQ - 2 Score 0 0    Fall Risk Fall Risk  02/14/2017 08/23/2016 11/25/2015 04/29/2015  Falls in the past year? Yes Yes No Yes  Number falls in past yr: 2 or more 2 or more - 1  Injury with Fall? Yes No - Yes    Cognitive Function: MMSE - Mini Mental State Exam 02/14/2017 08/12/2015  Orientation to time 2 4  Orientation to Place 4 5  Registration 3 3  Attention/ Calculation 5 5  Recall 0 3  Language- name 2 objects 2 2  Language- repeat 1 1  Language- follow 3 step command 3 3  Language- read & follow direction 1 1  Write a sentence 1 1  Copy design 0 1  Total score  22 29        Screening Tests Health Maintenance  Topic Date Due  . DEXA SCAN  07/20/1996  . TETANUS/TDAP  08/29/2018 (Originally 07/20/1948)  . PNA vac Low Risk Adult (2 of 2 - PPSV23) 08/29/2018 (Originally 09/28/2015)  . INFLUENZA VACCINE  03/29/2017      Plan:    I have personally reviewed and addressed the Medicare Annual Wellness questionnaire and have noted the following in the patient's chart:  A. Medical and social history B. Use of alcohol, tobacco or illicit drugs  C. Current medications and supplements D. Functional ability and status  E.  Nutritional status F.  Physical activity G. Advance directives H. List of other physicians I.  Hospitalizations, surgeries, and ER visits in previous 12 months J.  [place] to include hearing, vision, cognitive, depression L. Referrals and appointments - none  In addition, I have reviewed and discussed with patient certain preventive protocols, quality metrics, and best practice recommendations. A written personalized care plan for preventive services as well as general preventive health recommendations were provided to patient.  See attached scanned questionnaire for additional information.   Signed,   Rich Reining, RN Nurse Health Advisor   Quick Notes   Health Maintenance: DEXA, TDAP, PNA 23 due     Abnormal Screen: MMSE 22/30. Did not pass clock drawing     Patient Concerns: Unsure about surgery, would like to discuss     Nurse Concerns: None

## 2017-02-14 NOTE — Patient Instructions (Addendum)
Melanie Freeman , Thank you for taking time to come for your Medicare Wellness Visit. I appreciate your ongoing commitment to your health goals. Please review the following plan we discussed and let me know if I can assist you in the future.   Screening recommendations/referrals: Colonoscopy up to date, pt is over age 81 Mammogram up to date, pt is over age 109 Bone Density due Recommended yearly ophthalmology/optometry visit for glaucoma screening and checkup Recommended yearly dental visit for hygiene and checkup  Vaccinations: Influenza vaccine due 06/16/17 Pneumococcal vaccine 23 due Tdap vaccine due Shingles vaccine not in records    Advanced directives: In chart  Conditions/risks identified: none  Next appointment: Dr. Mariea Clonts 05/10/17 @ 3:30pm   Preventive Care 65 Years and Older, Female Preventive care refers to lifestyle choices and visits with your health care provider that can promote health and wellness. What does preventive care include?  A yearly physical exam. This is also called an annual well check.  Dental exams once or twice a year.  Routine eye exams. Ask your health care provider how often you should have your eyes checked.  Personal lifestyle choices, including:  Daily care of your teeth and gums.  Regular physical activity.  Eating a healthy diet.  Avoiding tobacco and drug use.  Limiting alcohol use.  Practicing safe sex.  Taking low-dose aspirin every day.  Taking vitamin and mineral supplements as recommended by your health care provider. What happens during an annual well check? The services and screenings done by your health care provider during your annual well check will depend on your age, overall health, lifestyle risk factors, and family history of disease. Counseling  Your health care provider may ask you questions about your:  Alcohol use.  Tobacco use.  Drug use.  Emotional well-being.  Home and relationship well-being.  Sexual  activity.  Eating habits.  History of falls.  Memory and ability to understand (cognition).  Work and work Statistician.  Reproductive health. Screening  You may have the following tests or measurements:  Height, weight, and BMI.  Blood pressure.  Lipid and cholesterol levels. These may be checked every 5 years, or more frequently if you are over 26 years old.  Skin check.  Lung cancer screening. You may have this screening every year starting at age 61 if you have a 30-pack-year history of smoking and currently smoke or have quit within the past 15 years.  Fecal occult blood test (FOBT) of the stool. You may have this test every year starting at age 71.  Flexible sigmoidoscopy or colonoscopy. You may have a sigmoidoscopy every 5 years or a colonoscopy every 10 years starting at age 33.  Hepatitis C blood test.  Hepatitis B blood test.  Sexually transmitted disease (STD) testing.  Diabetes screening. This is done by checking your blood sugar (glucose) after you have not eaten for a while (fasting). You may have this done every 1-3 years.  Bone density scan. This is done to screen for osteoporosis. You may have this done starting at age 37.  Mammogram. This may be done every 1-2 years. Talk to your health care provider about how often you should have regular mammograms. Talk with your health care provider about your test results, treatment options, and if necessary, the need for more tests. Vaccines  Your health care provider may recommend certain vaccines, such as:  Influenza vaccine. This is recommended every year.  Tetanus, diphtheria, and acellular pertussis (Tdap, Td) vaccine. You may need  a Td booster every 10 years.  Zoster vaccine. You may need this after age 80.  Pneumococcal 13-valent conjugate (PCV13) vaccine. One dose is recommended after age 48.  Pneumococcal polysaccharide (PPSV23) vaccine. One dose is recommended after age 43. Talk to your health care  provider about which screenings and vaccines you need and how often you need them. This information is not intended to replace advice given to you by your health care provider. Make sure you discuss any questions you have with your health care provider. Document Released: 09/11/2015 Document Revised: 05/04/2016 Document Reviewed: 06/16/2015 Elsevier Interactive Patient Education  2017 [place] Prevention in the Home Falls can cause injuries. They can happen to people of all ages. There are many things you can do to make your home safe and to help prevent falls. What can I do on the outside of my home?  Regularly fix the edges of walkways and driveways and fix any cracks.  Remove anything that might make you trip as you walk through a door, such as a raised step or threshold.  Trim any bushes or trees on the path to your home.  Use bright outdoor lighting.  Clear any walking paths of anything that might make someone trip, such as rocks or tools.  Regularly check to see if handrails are loose or broken. Make sure that both sides of any steps have handrails.  Any raised decks and porches should have guardrails on the edges.  Have any leaves, snow, or ice cleared regularly.  Use sand or salt on walking paths during winter.  Clean up any spills in your garage right away. This includes oil or grease spills. What can I do in the bathroom?  Use night lights.  Install grab bars by the toilet and in the tub and shower. Do not use towel bars as grab bars.  Use non-skid mats or decals in the tub or shower.  If you need to sit down in the shower, use a plastic, non-slip stool.  Keep the floor dry. Clean up any water that spills on the floor as soon as it happens.  Remove soap buildup in the tub or shower regularly.  Attach bath mats securely with double-sided non-slip rug tape.  Do not have throw rugs and other things on the floor that can make you trip. What can I do in  the bedroom?  Use night lights.  Make sure that you have a light by your bed that is easy to reach.  Do not use any sheets or blankets that are too big for your bed. They should not hang down onto the floor.  Have a firm chair that has side arms. You can use this for support while you get dressed.  Do not have throw rugs and other things on the floor that can make you trip. What can I do in the kitchen?  Clean up any spills right away.  Avoid walking on wet floors.  Keep items that you use a lot in easy-to-reach places.  If you need to reach something above you, use a strong step stool that has a grab bar.  Keep electrical cords out of the way.  Do not use floor polish or wax that makes floors slippery. If you must use wax, use non-skid floor wax.  Do not have throw rugs and other things on the floor that can make you trip. What can I do with my stairs?  Do not leave any items on the  stairs.  Make sure that there are handrails on both sides of the stairs and use them. Fix handrails that are broken or loose. Make sure that handrails are as long as the stairways.  Check any carpeting to make sure that it is firmly attached to the stairs. Fix any carpet that is loose or worn.  Avoid having throw rugs at the top or bottom of the stairs. If you do have throw rugs, attach them to the floor with carpet tape.  Make sure that you have a light switch at the top of the stairs and the bottom of the stairs. If you do not have them, ask someone to add them for you. What else can I do to help prevent falls?  Wear shoes that:  Do not have high heels.  Have rubber bottoms.  Are comfortable and fit you well.  Are closed at the toe. Do not wear sandals.  If you use a stepladder:  Make sure that it is fully opened. Do not climb a closed stepladder.  Make sure that both sides of the stepladder are locked into place.  Ask someone to hold it for you, if possible.  Clearly mark and  make sure that you can see:  Any grab bars or handrails.  First and last steps.  Where the edge of each step is.  Use tools that help you move around (mobility aids) if they are needed. These include:  Canes.  Walkers.  Scooters.  Crutches.  Turn on the lights when you go into a dark area. Replace any light bulbs as soon as they burn out.  Set up your furniture so you have a clear path. Avoid moving your furniture around.  If any of your floors are uneven, fix them.  If there are any pets around you, be aware of where they are.  Review your medicines with your doctor. Some medicines can make you feel dizzy. This can increase your chance of falling. Ask your doctor what other things that you can do to help prevent falls. This information is not intended to replace advice given to you by your health care provider. Make sure you discuss any questions you have with your health care provider. Document Released: 06/11/2009 Document Revised: 01/21/2016 Document Reviewed: 09/19/2014 Elsevier Interactive Patient Education  2017 [employer].

## 2017-02-16 ENCOUNTER — Other Ambulatory Visit: Payer: Medicare Other

## 2017-02-16 ENCOUNTER — Other Ambulatory Visit: Payer: Self-pay | Admitting: Internal Medicine

## 2017-02-16 DIAGNOSIS — M810 Age-related osteoporosis without current pathological fracture: Secondary | ICD-10-CM

## 2017-02-21 DIAGNOSIS — R296 Repeated falls: Secondary | ICD-10-CM | POA: Diagnosis not present

## 2017-02-21 DIAGNOSIS — M542 Cervicalgia: Secondary | ICD-10-CM | POA: Diagnosis not present

## 2017-02-21 DIAGNOSIS — R278 Other lack of coordination: Secondary | ICD-10-CM | POA: Diagnosis not present

## 2017-02-21 DIAGNOSIS — Z9181 History of falling: Secondary | ICD-10-CM | POA: Diagnosis not present

## 2017-02-24 ENCOUNTER — Other Ambulatory Visit: Payer: Medicare Other

## 2017-02-28 ENCOUNTER — Telehealth: Payer: Self-pay | Admitting: *Deleted

## 2017-02-28 NOTE — Telephone Encounter (Signed)
Prior auth for Myrbetriq was approved 02/25/2017 until 02/28/2018, done thru cover my meds. Copy gave to assisted living at [facility].

## 2017-03-15 ENCOUNTER — Encounter: Payer: Self-pay | Admitting: Internal Medicine

## 2017-03-24 ENCOUNTER — Ambulatory Visit
Admission: RE | Admit: 2017-03-24 | Discharge: 2017-03-24 | Disposition: A | Discharge status: Home / Self Care | Payer: Medicare Other | Source: Ambulatory Visit | Attending: Internal Medicine | Admitting: Internal Medicine

## 2017-03-24 DIAGNOSIS — Z780 Asymptomatic menopausal state: Secondary | ICD-10-CM | POA: Diagnosis not present

## 2017-03-24 DIAGNOSIS — M810 Age-related osteoporosis without current pathological fracture: Secondary | ICD-10-CM

## 2017-03-28 DIAGNOSIS — L2389 Allergic contact dermatitis due to other agents: Secondary | ICD-10-CM | POA: Diagnosis not present

## 2017-03-28 DIAGNOSIS — L821 Other seborrheic keratosis: Secondary | ICD-10-CM | POA: Diagnosis not present

## 2017-03-28 DIAGNOSIS — L814 Other melanin hyperpigmentation: Secondary | ICD-10-CM | POA: Diagnosis not present

## 2017-05-10 ENCOUNTER — Encounter: Payer: Self-pay | Admitting: Internal Medicine

## 2017-05-16 DIAGNOSIS — L2389 Allergic contact dermatitis due to other agents: Secondary | ICD-10-CM | POA: Diagnosis not present

## 2017-05-24 ENCOUNTER — Non-Acute Institutional Stay: Payer: Medicare Other | Admitting: Internal Medicine

## 2017-05-24 ENCOUNTER — Encounter: Payer: Self-pay | Admitting: Internal Medicine

## 2017-05-24 VITALS — BP 120/70 | HR 88 | Temp 98.3°F | Wt 103.0 lb

## 2017-05-24 DIAGNOSIS — G301 Alzheimer's disease with late onset: Secondary | ICD-10-CM

## 2017-05-24 DIAGNOSIS — M810 Age-related osteoporosis without current pathological fracture: Secondary | ICD-10-CM | POA: Diagnosis not present

## 2017-05-24 DIAGNOSIS — N3281 Overactive bladder: Secondary | ICD-10-CM | POA: Diagnosis not present

## 2017-05-24 DIAGNOSIS — L853 Xerosis cutis: Secondary | ICD-10-CM

## 2017-05-24 DIAGNOSIS — F0280 Dementia in other diseases classified elsewhere without behavioral disturbance: Secondary | ICD-10-CM | POA: Diagnosis not present

## 2017-05-24 DIAGNOSIS — F324 Major depressive disorder, single episode, in partial remission: Secondary | ICD-10-CM

## 2017-05-24 DIAGNOSIS — K5909 Other constipation: Secondary | ICD-10-CM | POA: Diagnosis not present

## 2017-05-24 NOTE — Progress Notes (Signed)
Location:  Occupational psychologist of Service:  Clinic (12)  Provider: Islah Eve L. Mariea Clonts, D.O., C.M.D.  Code Status: DNR Goals of Care:  Advanced Directives 05/24/2017  Does Patient Have a Medical Advance Directive? Yes  Type of Advance Directive Out of facility DNR (pink MOST or yellow form);[place];Living will  Does patient want to make changes to medical advance directive? -  Copy of [place] in Chart? Yes  Would patient like information on creating a medical advance directive? -  Pre-existing out of facility DNR order (yellow form or pink MOST form) Yellow form placed in chart (order not valid for inpatient use)   Chief Complaint  Patient presents with  . Medical Management of Chronic Issues    89mth follow-up    HPI: Patient is a 83 y.o. female seen today for medical management of chronic diseases.    Says her legs are bad.  The nurses put biofreeze on it in the morning.  She tells me about her old hip fracture.  [place] on her knee last year sometime.  Reports falling when standing at the kitchen sink after getting back into her apt.  Says she was too far from the emergency button so she grabbed the door and slammed it and yelled help.  Says she's had a headache ever since and that was over a month ago.  She did hit her head and she had a yellow bruise when she went to the hairdresser.    Dementia:  Tells me about things from a year ago.    Says the tylenol doesn't do anything for the headaches which she can't be consistent with where they hurt.  Was getting therapy for her neck.  Got neck brace from therapy.  She's no longer getting therapy thru home care b/c home care aid changed again.    Dermatologist is treating her itching on her back.  Two creams were turned down, third one bothered her and now she's on an ointment that has helped.  On allegra.    Constipation:  Sometimes bowels move well, but not other times.  Eats  breakfast and that's about it.  She says she drinks boost, but apparently they are stockpiled in the fridge per her son in law.  Stopped going to lunch and dinner.    Overactive bladder:  Says she is making it to the bathroom in time.  On myrbetriq.  Says it takes an effort to transfer.    Past Medical History:  Diagnosis Date  . Allergic contact dermatitis   . Anxiety   . Arthritis   . Closed fracture of neck of femur ([place]) 01/25/2016  . Cystitis, interstitial   . Depression   . Femur fracture, right ([place]) 01/26/16  . Fracture of right inferior pubic ramus ([place]) 01/26/16  . Hepatitis    over 50 years ago  . Long-term use of high-risk medication   . Osteopenia   . Tremors of nervous system   . Vertigo     Past Surgical History:  Procedure Laterality Date  . APPENDECTOMY    . DILATATION & CURETTAGE/HYSTEROSCOPY WITH MYOSURE N/A 06/26/2015   Procedure: DILATATION & CURETTAGE/HYSTEROSCOPY WITH MYOSURE;  Surgeon: Arvella Nigh, MD;  Location: [facility];  Service: Gynecology;  Laterality: N/A;  . FRACTURE SURGERY  2015   left hip fracture  . HIP ORIF W/ CAPSULOTOMY Left 10/22/2013   [name]   . RIGHT hip percutaneous pinning of impacted femoral  neck fracture  01/26/16   [facility]--Dr. Jaynee Eagles orthopedics  . TONSILLECTOMY      Allergies  Allergen Reactions  . Ham Other (See Comments)  . Latex Rash  . Other Other (See Comments)    Pt reports was seen by neurologist for tremors and was allergic to all medications prescribed for tremors, but does not know names or reaction. Patient reports being allergic to polyester. Does not know what reaction would be. States she was seen by a doctor who tested her for multiple allergies and she had positive results.  . Ciprofloxacin Other (See Comments)    Doesn't work  . Doxycycline Other (See Comments)    unknown  . Erythromycin     Noted from Neurologist in [place]  . Hydrocortisone   . Levaquin [Levofloxacin In D5w] Other (See Comments)      unknown  . Lopid [Gemfibrozil]   . Macrobid [employer] Macro] Other (See Comments)    Doesn't work  . Sulfa Antibiotics Other (See Comments)    unknown    Outpatient Encounter Prescriptions as of 05/24/2017  Medication Sig  . acetaminophen (TYLENOL) 325 MG tablet Take 650 mg by mouth 4 (four) times daily.  . cephALEXin (KEFLEX) 250 MG capsule Take 250 mg by mouth daily before lunch.  . Cholecalciferol (D3 HIGH POTENCY) 1000 UNITS capsule Take 1,000 Units by mouth daily. Take one tablet daily  . citalopram (CELEXA) 20 MG tablet Take 20 mg by mouth daily.  . diclofenac sodium (VOLTAREN) 1 % GEL Apply 2 g topically 4 (four) times daily. Apply for neck pain and apply 4g qid for right hip pain  . docusate sodium (COLACE) 100 MG capsule Take 200 mg by mouth daily.  . feeding supplement (BOOST HIGH PROTEIN) LIQD Take 1 Container by mouth daily.  . fexofenadine (ALLEGRA) 60 MG tablet Take 60 mg by mouth daily.  . fluocinolone (VANOS) 0.01 % cream Apply 1 application topically 2 (two) times daily as needed (skin rash).  . memantine (NAMENDA XR) 7 MG CP24 24 hr capsule Take 1 capsule (7 mg total) by mouth daily.  . Menthol, Topical Analgesic, (BIOFREEZE EX) Apply topically 2 (two) times daily.  . mirabegron ER (MYRBETRIQ) 50 MG TB24 tablet Take 50 mg by mouth daily.  [name] Glycol-Propyl Glycol (SYSTANE OP) Place 1 drop into both eyes 4 (four) times daily.  . raloxifene (EVISTA) 60 MG tablet Take 1 tablet (60 mg total) by mouth daily. Take one table daily  . vitamin B-12 (CYANOCOBALAMIN) 1000 MCG tablet Take 1,000 mcg by mouth every other day.  . [DISCONTINUED] LORazepam (ATIVAN) 0.5 MG tablet Take 0.5 mg by mouth as needed (before procedures).   No facility-administered encounter medications on file as of 05/24/2017.     Review of Systems:  Review of Systems  Constitutional: Positive for malaise/fatigue. Negative for chills and fever.  HENT: Positive for hearing loss.  Negative for congestion.   Eyes: Negative for blurred vision.       Glasses  Respiratory: Negative for shortness of breath.   Cardiovascular: Negative for chest pain, palpitations and leg swelling.       Says feet are swollen, but do not appear that way  Gastrointestinal: Positive for constipation. Negative for abdominal pain, blood in stool and melena.  Genitourinary: Positive for urgency. Negative for dysuria and frequency.  Musculoskeletal: Positive for falls, joint pain, myalgias and neck pain.       Right hip  Skin: Positive for itching. Negative for rash.  Neurological: Positive for tremors and weakness. Negative for dizziness and loss of consciousness.  Endo/Heme/Allergies: Bruises/bleeds easily.  Psychiatric/Behavioral: Positive for memory loss.    Health Maintenance  Topic Date Due  . INFLUENZA VACCINE  03/29/2017  . TETANUS/TDAP  08/29/2018 (Originally 07/20/1948)  . PNA vac Low Risk Adult (2 of 2 - PPSV23) 08/29/2018 (Originally 09/28/2015)  . DEXA SCAN  Completed    Physical Exam: Vitals:   05/24/17 1403  BP: 120/70  Pulse: 88  Temp: 98.3 F (36.8 C)  TempSrc: Oral  SpO2: 93%  Weight: 103 lb (46.7 kg)   Body mass index is 20.12 kg/m. Physical Exam  Constitutional: No distress.  Cardiovascular: Normal rate, regular rhythm, normal heart sounds and intact distal pulses.   Pulmonary/Chest: Effort normal and breath sounds normal. No respiratory distress.  Abdominal: Bowel sounds are normal.  Musculoskeletal:  Tenderness of right groin area  Neurological: She is alert.  Oriented to person, place, not time  Skin: Skin is warm and dry.    Labs reviewed: Basic Metabolic Panel:  Recent Labs  08/23/16 1434  NA 137  K 4.6  CL 104  CO2 26  GLUCOSE 101*  BUN 22  CREATININE 1.13*  CALCIUM 9.1   Liver Function Tests:  Recent Labs  08/23/16 1434  AST 21  ALT 7  ALKPHOS 49  BILITOT 0.5  PROT 7.0  ALBUMIN 4.2   No results for input(s): LIPASE,  AMYLASE in the last 8760 hours. No results for input(s): AMMONIA in the last 8760 hours. CBC:  Recent Labs  08/23/16 1434  WBC 5.8  NEUTROABS 3,016  HGB 13.7  HCT 42.1  MCV 94.0  PLT 210   Assessment/Plan 1. Late onset Alzheimer's disease without behavioral disturbance -cont namenda XR low dose  2. Chronic constipation -cont colace  3. Senile osteoporosis -cont vitamin D therapy, evista at her request though recommended at change due to recent fractures  4. Overactive bladder -cont myrbetriq which has helped  5. Major depressive disorder with single episode, in partial remission ([place]) -cont celexa therapy, has some OCD as well  6. Xerosis cutis -cont creams per dermatology for dry skin, dermatitis and itching  Labs/tests ordered:  Cbc, cmp, flp before next visit Next appt:  09/27/2017 med mgt, labs before  Monti Villers L. Orel Hord, D.O. [facility] [address], [place] [address] Cell Phone (Mon-Fri 8am-5pm):  [phone] On Call:  [phone] & follow prompts after 5pm & weekends Office Phone:  [phone] Office Fax:  [phone]

## 2017-06-22 DIAGNOSIS — Z23 Encounter for immunization: Secondary | ICD-10-CM | POA: Diagnosis not present

## 2017-06-27 ENCOUNTER — Encounter (HOSPITAL_COMMUNITY): Payer: Self-pay

## 2017-06-27 ENCOUNTER — Emergency Department (HOSPITAL_COMMUNITY)
Admission: EM | Admit: 2017-06-27 | Discharge: 2017-06-27 | Disposition: A | Discharge status: Skilled Nursing Facility | Payer: Medicare Other | Attending: Emergency Medicine | Admitting: Emergency Medicine

## 2017-06-27 DIAGNOSIS — Z91040 Latex allergy status: Secondary | ICD-10-CM | POA: Diagnosis not present

## 2017-06-27 DIAGNOSIS — Z79899 Other long term (current) drug therapy: Secondary | ICD-10-CM | POA: Diagnosis not present

## 2017-06-27 DIAGNOSIS — Z87891 Personal history of nicotine dependence: Secondary | ICD-10-CM | POA: Diagnosis not present

## 2017-06-27 DIAGNOSIS — W19XXXA Unspecified fall, initial encounter: Secondary | ICD-10-CM

## 2017-06-27 DIAGNOSIS — Z043 Encounter for examination and observation following other accident: Secondary | ICD-10-CM | POA: Diagnosis not present

## 2017-06-27 DIAGNOSIS — R51 Headache: Secondary | ICD-10-CM | POA: Diagnosis not present

## 2017-06-27 DIAGNOSIS — R251 Tremor, unspecified: Secondary | ICD-10-CM | POA: Diagnosis not present

## 2017-06-27 DIAGNOSIS — G8929 Other chronic pain: Secondary | ICD-10-CM | POA: Diagnosis not present

## 2017-06-27 DIAGNOSIS — I6789 Other cerebrovascular disease: Secondary | ICD-10-CM | POA: Diagnosis not present

## 2017-06-27 DIAGNOSIS — M542 Cervicalgia: Secondary | ICD-10-CM | POA: Diagnosis not present

## 2017-06-27 NOTE — ED Notes (Signed)
Bed: [facility] Expected date:  Expected time:  Means of arrival:  Comments: EMS-fall-no injury

## 2017-06-27 NOTE — ED Provider Notes (Signed)
[facility] DEPT Provider Note   CSN: [id number] Arrival date & time: 06/27/17  1602     History   Chief Complaint Chief Complaint  Patient presents with  . Fall    HPI Melanie Freeman is a 83 y.o. female.  HPI  83 year old female presents after a fall at her facility.  She denies any new pain or injuries from today.  She states that she was on her walker and was sitting down but then the break was not working and she was not strong enough to squeeze it because of right hand arthritis.  [place] off the walker and landed on her left side.  She was laying on her left side but states she has no left-sided leg pain or hip pain.  She has a chronic history of headaches according to the daughter at the bedside and complains of this but did not hit her head today.  But no loss of consciousness.  No vomiting.  She denies any current neck pain but does get physical therapy for chronic neck pain.  She has had both hips replaced and denies pain in either.  No shoulder or extremity pain.  No abdominal pain/chest pain.  No back pain. Did not want to come but facility sent her for evaluation.  Past Medical History:  Diagnosis Date  . Allergic contact dermatitis   . Anxiety   . Arthritis   . Closed fracture of neck of femur ([place]) 01/25/2016  . Cystitis, interstitial   . Depression   . Femur fracture, right ([place]) 01/26/16  . Fracture of right inferior pubic ramus ([place]) 01/26/16  . Hepatitis    over 50 years ago  . Long-term use of high-risk medication   . Osteopenia   . Tremors of nervous system   . Vertigo     Patient Active Problem List   Diagnosis Date Noted  . Depression 04/28/2016  . Status post-operative repair of closed hip fracture 01/27/2016  . Closed fracture of single pubic ramus of pelvis ([place]) 01/25/2016  . Closed fracture of neck of femur ([place]) 01/25/2016  . Fall on same level from slipping, tripping and stumbling without subsequent striking against  object, initial encounter 01/25/2016  . Anxiety state 08/12/2015  . OCD (obsessive compulsive disorder) 08/12/2015  . Peripheral neuropathy 08/12/2015  . Dementia 08/12/2015  . Postmenopausal vaginal bleeding 04/29/2015  . Recurrent UTI 04/29/2015  . Overactive bladder 04/29/2015  . Unsteady gait 04/29/2015  . Allergic contact dermatitis due to multiple agents 04/29/2015  . Benign essential tremor 04/29/2015  . Senile osteoporosis 04/29/2015  . Chronic constipation 04/29/2015    Past Surgical History:  Procedure Laterality Date  . APPENDECTOMY    . DILATATION & CURETTAGE/HYSTEROSCOPY WITH MYOSURE N/A 06/26/2015   Procedure: DILATATION & CURETTAGE/HYSTEROSCOPY WITH MYOSURE;  Surgeon: Arvella Nigh, MD;  Location: [facility];  Service: Gynecology;  Laterality: N/A;  . FRACTURE SURGERY  2015   left hip fracture  . HIP ORIF W/ CAPSULOTOMY Left 10/22/2013   [name]   . RIGHT hip percutaneous pinning of impacted femoral neck fracture  01/26/16   [facility]--Dr. Jaynee Eagles orthopedics  . TONSILLECTOMY      OB History    No data available       Home Medications    Prior to Admission medications   Medication Sig Start Date End Date Taking? Authorizing Provider  acetaminophen (TYLENOL) 325 MG tablet Take 650 mg by mouth 4 (four) times daily.    [provider]  cephALEXin (KEFLEX) 250 MG capsule Take 250 mg by mouth daily before lunch.    [provider]  Cholecalciferol (D3 HIGH POTENCY) 1000 UNITS capsule Take 1,000 Units by mouth daily. Take one tablet daily    [provider]  citalopram (CELEXA) 20 MG tablet Take 20 mg by mouth daily.    [provider]  diclofenac sodium (VOLTAREN) 1 % GEL Apply 2 g topically 4 (four) times daily. Apply for neck pain and apply 4g qid for right hip pain 12/21/16   Reed, Tiffany L, DO  docusate sodium (COLACE) 100 MG capsule Take 200 mg by mouth daily.    [provider]  feeding supplement (BOOST HIGH  PROTEIN) LIQD Take 1 Container by mouth daily.    [provider]  fexofenadine (ALLEGRA) 60 MG tablet Take 60 mg by mouth daily. 05/18/17 06/15/17  [provider]  fluocinolone (VANOS) 0.01 % cream Apply 1 application topically 2 (two) times daily as needed (skin rash).    [provider]  memantine (NAMENDA XR) 7 MG CP24 24 hr capsule Take 1 capsule (7 mg total) by mouth daily. 12/21/16   Reed, Tiffany L, DO  Menthol, Topical Analgesic, (BIOFREEZE EX) Apply topically 2 (two) times daily.    [provider]  mirabegron ER (MYRBETRIQ) 50 MG TB24 tablet Take 50 mg by mouth daily.    [provider]  Polyethyl Glycol-Propyl Glycol (SYSTANE OP) Place 1 drop into both eyes 4 (four) times daily.    [provider]  raloxifene (EVISTA) 60 MG tablet Take 1 tablet (60 mg total) by mouth daily. Take one table daily 11/25/15   Reed, Tiffany L, DO  vitamin B-12 (CYANOCOBALAMIN) 1000 MCG tablet Take 1,000 mcg by mouth every other day.    [provider]    Family History Family History  Problem Relation Age of Onset  . Dementia Mother   . Cancer Father        pancreatic  . Hepatitis Brother     Social History Social History  Substance Use Topics  . Smoking status: Former Smoker    Years: 5.00  . Smokeless tobacco: Never Used  . Alcohol use No     Allergies   Ham; Latex; Other; Ciprofloxacin; Doxycycline; Erythromycin; Hydrocortisone; Levaquin [levofloxacin in d5w]; Lopid [gemfibrozil]; Macrobid [nitrofurantoin monohyd macro]; and Sulfa antibiotics   Review of Systems Review of Systems  Constitutional: Negative for fever.  Gastrointestinal: Negative for vomiting.  Musculoskeletal: Positive for neck pain (chronic). Negative for arthralgias.  Neurological: Positive for headaches.  All other systems reviewed and are negative.    Physical Exam Updated Vital Signs BP (!) 158/101 (BP Location: Left Arm)   Pulse 78   Temp 98.5  F (36.9 C) (Oral)   Resp 18   Wt 46.7 kg (103 lb)   SpO2 95%   BMI 20.12 kg/m   Physical Exam  Constitutional: She appears well-developed and well-nourished. No distress.  HENT:  Head: Normocephalic and atraumatic.  Right Ear: External ear normal.  Left Ear: External ear normal.  Nose: Nose normal.  Eyes: EOM are normal. Right eye exhibits no discharge. Left eye exhibits no discharge.  Neck: Normal range of motion. Neck supple. No spinous process tenderness and no muscular tenderness present.  Cardiovascular: Normal rate, regular rhythm and normal heart sounds.   Pulmonary/Chest: Effort normal and breath sounds normal.  Abdominal: Soft. She exhibits no distension. There is no tenderness.  Musculoskeletal:  Right shoulder: She exhibits normal range of motion and no tenderness.       Left shoulder: She exhibits normal range of motion and no tenderness.       Right hip: She exhibits normal range of motion and no tenderness.       Left hip: She exhibits normal range of motion and no tenderness.       Cervical back: She exhibits no tenderness.       Thoracic back: She exhibits no tenderness.       Lumbar back: She exhibits no tenderness.  No focal joint swelling or tenderness in her extremities  Neurological: She is alert.  CN 3-12 grossly intact. 5/5 strength in all 4 extremities. Grossly normal sensation.   Skin: Skin is warm and dry. She is not diaphoretic.  Nursing note and vitals reviewed.    ED Treatments / Results  Labs (all labs ordered are listed, but only abnormal results are displayed) Labs Reviewed - No data to display  EKG  EKG Interpretation None       Radiology No results found.  Procedures Procedures (including critical care time)  Medications Ordered in ED Medications - No data to display   Initial Impression / Assessment and Plan / ED Course  I have reviewed the triage vital signs and the nursing notes.  Pertinent labs & imaging results  that were available during my care of the patient were reviewed by me and considered in my medical decision making (see chart for details).     Patient denies any new injuries or new pain.  Appears to be a mechanical fall. She overall appears well.  She is able to ambulate without difficulty.  I see no indication for acute imaging or signs of an acute injury or fracture.  Discussed with patient and daughter, they are comfortable going home, return if any symptoms develop or recurrent falls.  Return precautions.  Final Clinical Impressions(s) / ED Diagnoses   Final diagnoses:  Fall, initial encounter    New Prescriptions New Prescriptions   No medications on file     Sherwood Gambler, MD 06/27/17 [phone]

## 2017-06-27 NOTE — ED Notes (Signed)
Pt ambulated down hall to secretary station and back with walker without assistance

## 2017-06-27 NOTE — ED Triage Notes (Signed)
Pt came via [facility] from assisted living, unwitnessed fall today out of her walker, c/o of shoulder pain, no LOC.  Pt has had multiple falls in past 3 months with chronic pain in neck/back of head and knee, pt wanted to come to ED to be checked out.

## 2017-07-02 IMAGING — MR MR CERVICAL SPINE W/O CM
4 of 6 series · 26 of 48 positions shown · non-contrast
Comparison: None.

CLINICAL DATA: Headaches and right-sided neck pain.

EXAM:
MRI CERVICAL SPINE WITHOUT CONTRAST
TECHNIQUE: Multiplanar, multisequence MR imaging of the cervical spine was
performed. No intravenous contrast was administered.

[Series 5: T2 · sagittal · 3.5mm · 0.35mm/px · 5 of 13 slices shown (1 of 2)]
[im 1/13]
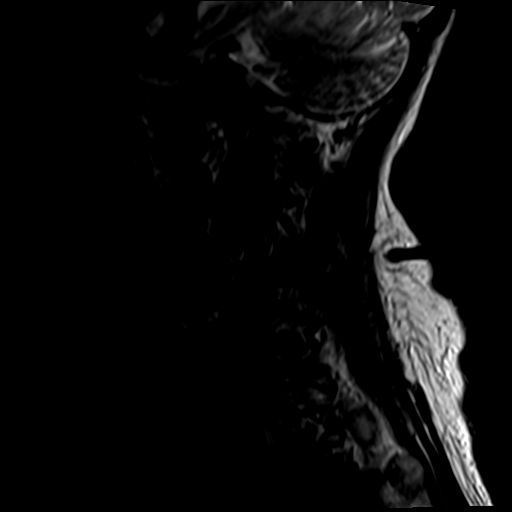
[im 4/13]
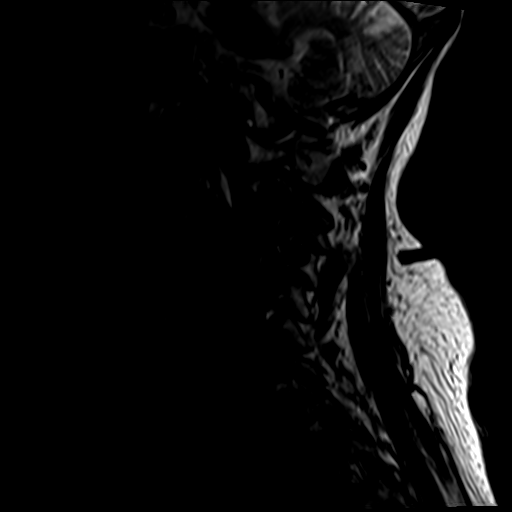
[im 7/13]
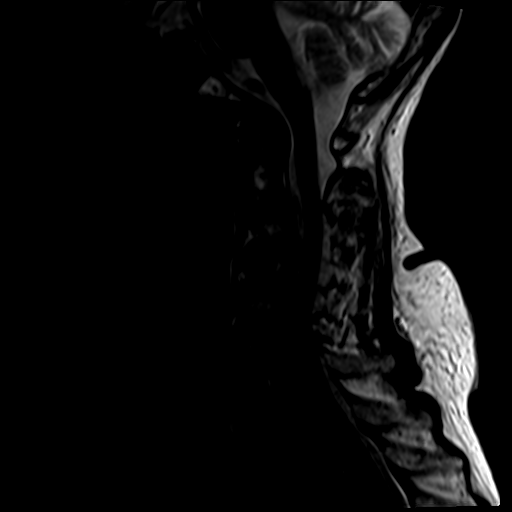
[im 10/13]
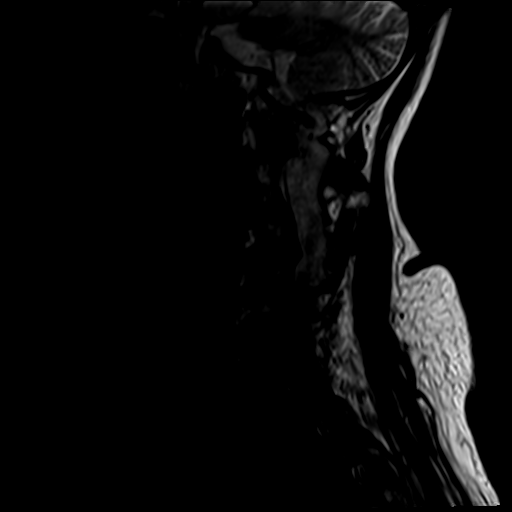
[im 13/13]
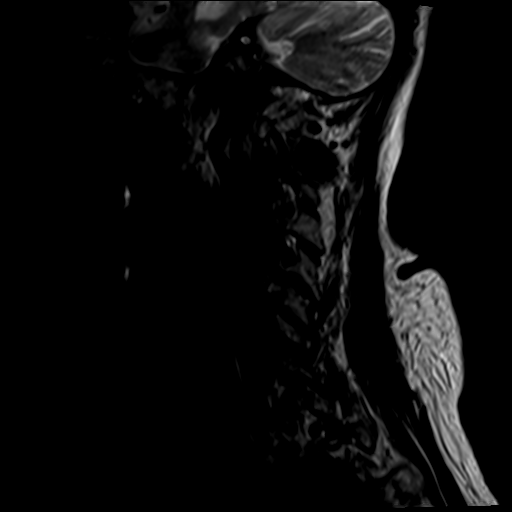

[Series 7: T1 · sagittal · 3.5mm · 0.35mm/px · 5 of 12 slices shown (1 of 2)]
[im 1/12]
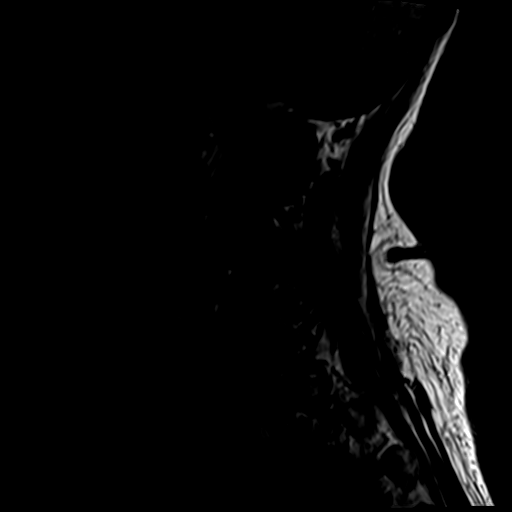
[im 3/12]
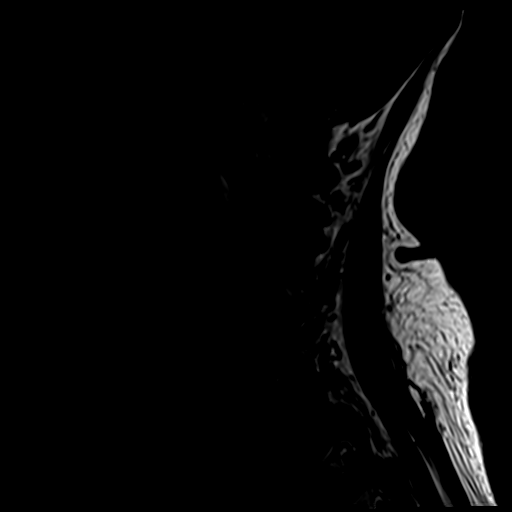
[im 6/12]
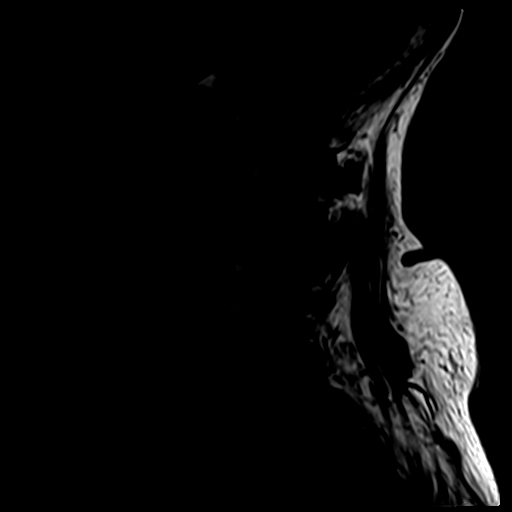
[im 9/12]
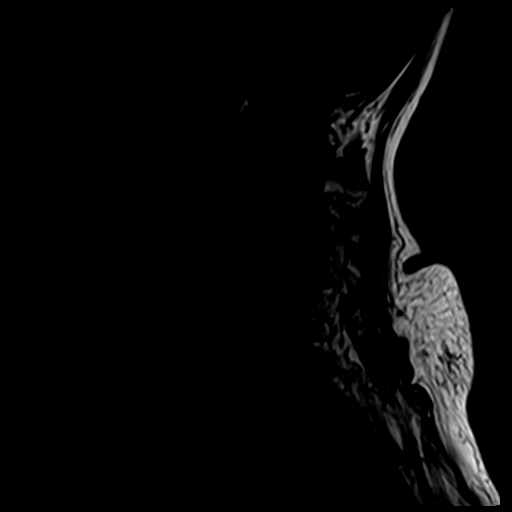
[im 12/12]
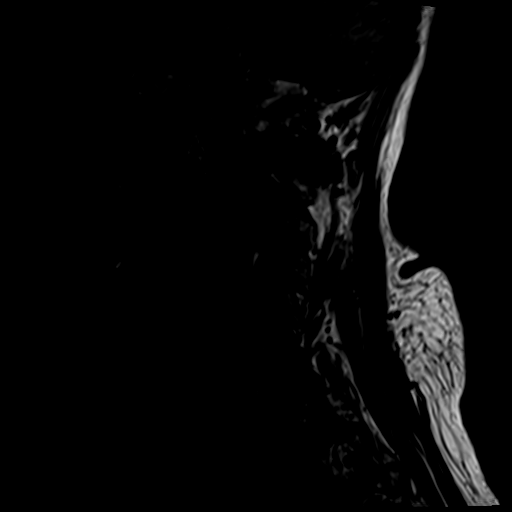

[Series 8: T2 · axial · 3.0mm · 0.39mm/px · z∈[-32,+50]mm · 8 of 24 slices shown (2 of 2)]
[im 1/24]
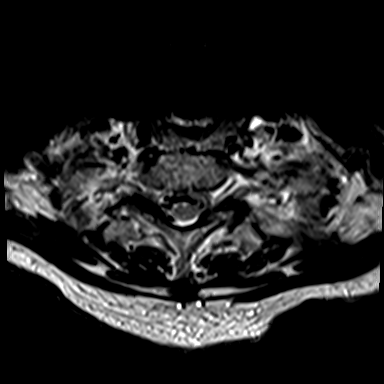
[im 3/24]
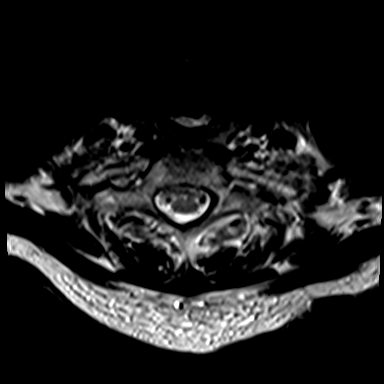
[im 8/24]
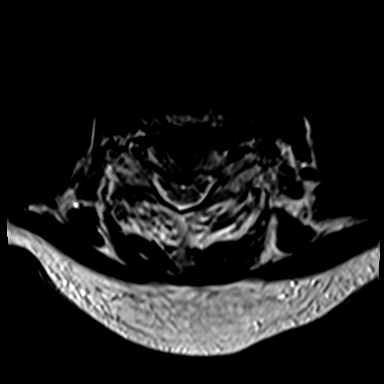
[im 11/24]
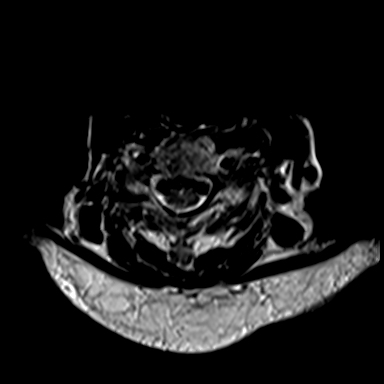
[im 13/24]
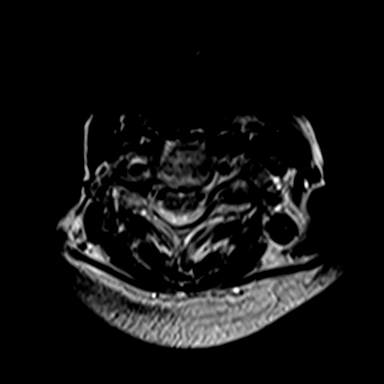
[im 16/24]
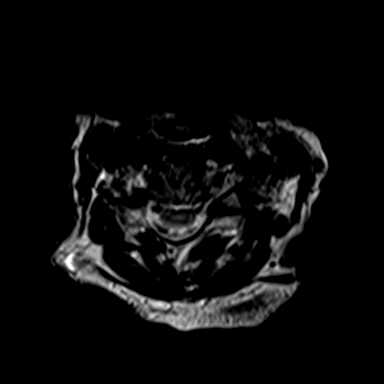
[im 21/24]
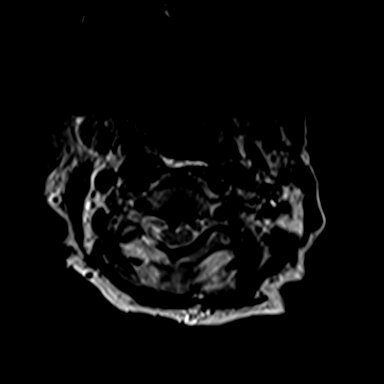
[im 24/24]
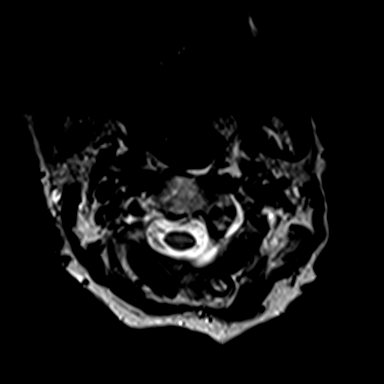

[Series 9: T1 · axial · 3.0mm · 0.47mm/px · z∈[-36,+36]mm · 8 of 25 slices shown (2 of 2)]
[im 1/25]
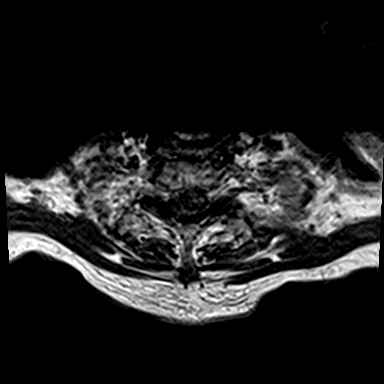
[im 3/25]
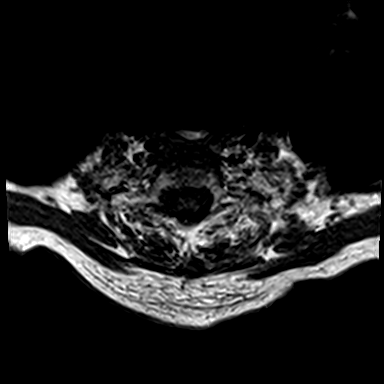
[im 5/25]
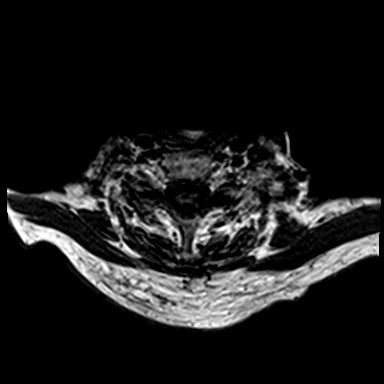
[im 8/25]
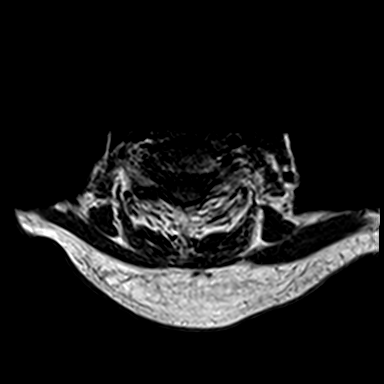
[im 10/25]
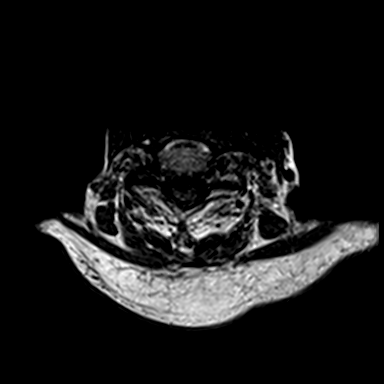
[im 13/25]
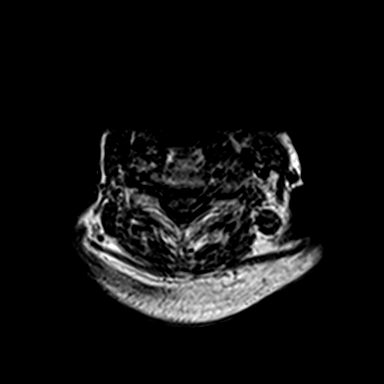
[im 15/25]
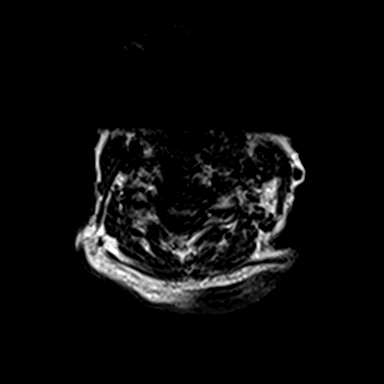
[im 22/25]
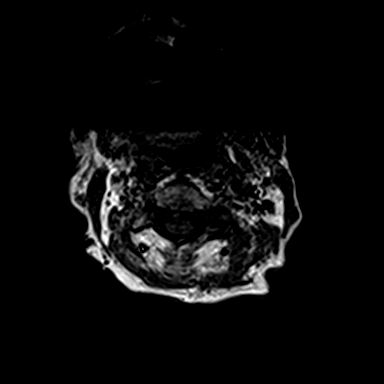

[26 of 48 positions shown; findings below may reference images not displayed]

FINDINGS: Examination is moderately motion degraded due to patient tremors.

Alignment: Slight anterolisthesis of C2 on C3, C7 on T1, T1 on T2,
and T2 on T3. Slight retrolisthesis of C3 on C4, C4 on C5, and C5 on
C6, degenerative in appearance.

Vertebrae: No evidence of fracture, destructive osseous lesion, or
significant marrow edema. Mild degenerative endplate marrow changes
C3-C6.

Cord: No definite spinal cord signal abnormality within limitations
of motion artifact.

Posterior Fossa, vertebral arteries, paraspinal tissues:
Unremarkable.

Disc levels:

C2-3: Small to moderate-sized right central disc protrusion contacts
and moderately indents the ventral spinal cord. The AP diameter of
the canal is narrowed to 7 mm focally in the midline due to the disc
protrusion. Left uncovertebral spurring results in mild left neural
foraminal stenosis.

C3-4: Severe disc space narrowing. Mild disc bulging and
uncovertebral spurring without stenosis.

C4-5: Severe disc space narrowing. Osteophytic ridging without
significant stenosis.

C5-6: Severe disc space narrowing. Diffuse posterior spurring
results in mild spinal stenosis with a slight impression on the
ventral spinal cord. No definite neural foraminal stenosis.

C6-7: Broad-based posterior disc osteophyte complex without
significant stenosis.

C7-T1:  Right greater than left facet arthrosis without stenosis.
IMPRESSION: 1. Motion degraded examination.
2. Central C2-3 disc protrusion with moderate spinal cord
flattening.
3. Severe disc degeneration from C3-4 to C5-6. Mild spinal stenosis
due to spurring at C5-6.

## 2017-07-07 DIAGNOSIS — Z9181 History of falling: Secondary | ICD-10-CM | POA: Diagnosis not present

## 2017-07-07 DIAGNOSIS — R2689 Other abnormalities of gait and mobility: Secondary | ICD-10-CM | POA: Diagnosis not present

## 2017-07-07 DIAGNOSIS — F0281 Dementia in other diseases classified elsewhere with behavioral disturbance: Secondary | ICD-10-CM | POA: Diagnosis not present

## 2017-07-17 DIAGNOSIS — Z9181 History of falling: Secondary | ICD-10-CM | POA: Diagnosis not present

## 2017-07-17 DIAGNOSIS — M84451A Pathological fracture, right femur, initial encounter for fracture: Secondary | ICD-10-CM | POA: Diagnosis not present

## 2017-07-17 DIAGNOSIS — R296 Repeated falls: Secondary | ICD-10-CM | POA: Diagnosis not present

## 2017-07-17 DIAGNOSIS — M25551 Pain in right hip: Secondary | ICD-10-CM | POA: Diagnosis not present

## 2017-07-17 DIAGNOSIS — D485 Neoplasm of uncertain behavior of skin: Secondary | ICD-10-CM | POA: Diagnosis not present

## 2017-07-17 DIAGNOSIS — L2389 Allergic contact dermatitis due to other agents: Secondary | ICD-10-CM | POA: Diagnosis not present

## 2017-07-17 DIAGNOSIS — L821 Other seborrheic keratosis: Secondary | ICD-10-CM | POA: Diagnosis not present

## 2017-07-17 DIAGNOSIS — R2689 Other abnormalities of gait and mobility: Secondary | ICD-10-CM | POA: Diagnosis not present

## 2017-07-17 DIAGNOSIS — F0281 Dementia in other diseases classified elsewhere with behavioral disturbance: Secondary | ICD-10-CM | POA: Diagnosis not present

## 2017-07-18 DIAGNOSIS — M25551 Pain in right hip: Secondary | ICD-10-CM | POA: Diagnosis not present

## 2017-07-18 DIAGNOSIS — M84451A Pathological fracture, right femur, initial encounter for fracture: Secondary | ICD-10-CM | POA: Diagnosis not present

## 2017-07-18 DIAGNOSIS — R296 Repeated falls: Secondary | ICD-10-CM | POA: Diagnosis not present

## 2017-07-18 DIAGNOSIS — F0281 Dementia in other diseases classified elsewhere with behavioral disturbance: Secondary | ICD-10-CM | POA: Diagnosis not present

## 2017-07-18 DIAGNOSIS — R2689 Other abnormalities of gait and mobility: Secondary | ICD-10-CM | POA: Diagnosis not present

## 2017-07-18 DIAGNOSIS — Z9181 History of falling: Secondary | ICD-10-CM | POA: Diagnosis not present

## 2017-07-19 DIAGNOSIS — F0281 Dementia in other diseases classified elsewhere with behavioral disturbance: Secondary | ICD-10-CM | POA: Diagnosis not present

## 2017-07-19 DIAGNOSIS — Z9181 History of falling: Secondary | ICD-10-CM | POA: Diagnosis not present

## 2017-07-19 DIAGNOSIS — R296 Repeated falls: Secondary | ICD-10-CM | POA: Diagnosis not present

## 2017-07-19 DIAGNOSIS — M84451A Pathological fracture, right femur, initial encounter for fracture: Secondary | ICD-10-CM | POA: Diagnosis not present

## 2017-07-19 DIAGNOSIS — M25551 Pain in right hip: Secondary | ICD-10-CM | POA: Diagnosis not present

## 2017-07-19 DIAGNOSIS — R2689 Other abnormalities of gait and mobility: Secondary | ICD-10-CM | POA: Diagnosis not present

## 2017-07-24 DIAGNOSIS — F0281 Dementia in other diseases classified elsewhere with behavioral disturbance: Secondary | ICD-10-CM | POA: Diagnosis not present

## 2017-07-24 DIAGNOSIS — M84451A Pathological fracture, right femur, initial encounter for fracture: Secondary | ICD-10-CM | POA: Diagnosis not present

## 2017-07-24 DIAGNOSIS — M25551 Pain in right hip: Secondary | ICD-10-CM | POA: Diagnosis not present

## 2017-07-24 DIAGNOSIS — Z9181 History of falling: Secondary | ICD-10-CM | POA: Diagnosis not present

## 2017-07-24 DIAGNOSIS — R2689 Other abnormalities of gait and mobility: Secondary | ICD-10-CM | POA: Diagnosis not present

## 2017-07-24 DIAGNOSIS — R296 Repeated falls: Secondary | ICD-10-CM | POA: Diagnosis not present

## 2017-07-26 DIAGNOSIS — F0281 Dementia in other diseases classified elsewhere with behavioral disturbance: Secondary | ICD-10-CM | POA: Diagnosis not present

## 2017-07-26 DIAGNOSIS — R296 Repeated falls: Secondary | ICD-10-CM | POA: Diagnosis not present

## 2017-07-26 DIAGNOSIS — R2689 Other abnormalities of gait and mobility: Secondary | ICD-10-CM | POA: Diagnosis not present

## 2017-07-26 DIAGNOSIS — Z9181 History of falling: Secondary | ICD-10-CM | POA: Diagnosis not present

## 2017-07-26 DIAGNOSIS — M84451A Pathological fracture, right femur, initial encounter for fracture: Secondary | ICD-10-CM | POA: Diagnosis not present

## 2017-07-26 DIAGNOSIS — M25551 Pain in right hip: Secondary | ICD-10-CM | POA: Diagnosis not present

## 2017-07-28 DIAGNOSIS — Z9181 History of falling: Secondary | ICD-10-CM | POA: Diagnosis not present

## 2017-07-28 DIAGNOSIS — M84451A Pathological fracture, right femur, initial encounter for fracture: Secondary | ICD-10-CM | POA: Diagnosis not present

## 2017-07-28 DIAGNOSIS — R2689 Other abnormalities of gait and mobility: Secondary | ICD-10-CM | POA: Diagnosis not present

## 2017-07-28 DIAGNOSIS — F0281 Dementia in other diseases classified elsewhere with behavioral disturbance: Secondary | ICD-10-CM | POA: Diagnosis not present

## 2017-07-28 DIAGNOSIS — R296 Repeated falls: Secondary | ICD-10-CM | POA: Diagnosis not present

## 2017-07-28 DIAGNOSIS — M25551 Pain in right hip: Secondary | ICD-10-CM | POA: Diagnosis not present

## 2017-08-01 DIAGNOSIS — R2689 Other abnormalities of gait and mobility: Secondary | ICD-10-CM | POA: Diagnosis not present

## 2017-08-01 DIAGNOSIS — Z9181 History of falling: Secondary | ICD-10-CM | POA: Diagnosis not present

## 2017-08-01 DIAGNOSIS — M84451A Pathological fracture, right femur, initial encounter for fracture: Secondary | ICD-10-CM | POA: Diagnosis not present

## 2017-08-01 DIAGNOSIS — F0281 Dementia in other diseases classified elsewhere with behavioral disturbance: Secondary | ICD-10-CM | POA: Diagnosis not present

## 2017-08-01 DIAGNOSIS — M25551 Pain in right hip: Secondary | ICD-10-CM | POA: Diagnosis not present

## 2017-08-01 DIAGNOSIS — R296 Repeated falls: Secondary | ICD-10-CM | POA: Diagnosis not present

## 2017-08-02 DIAGNOSIS — M25551 Pain in right hip: Secondary | ICD-10-CM | POA: Diagnosis not present

## 2017-08-02 DIAGNOSIS — R2689 Other abnormalities of gait and mobility: Secondary | ICD-10-CM | POA: Diagnosis not present

## 2017-08-02 DIAGNOSIS — Z9181 History of falling: Secondary | ICD-10-CM | POA: Diagnosis not present

## 2017-08-02 DIAGNOSIS — M84451A Pathological fracture, right femur, initial encounter for fracture: Secondary | ICD-10-CM | POA: Diagnosis not present

## 2017-08-02 DIAGNOSIS — R296 Repeated falls: Secondary | ICD-10-CM | POA: Diagnosis not present

## 2017-08-02 DIAGNOSIS — F0281 Dementia in other diseases classified elsewhere with behavioral disturbance: Secondary | ICD-10-CM | POA: Diagnosis not present

## 2017-08-03 DIAGNOSIS — M84451A Pathological fracture, right femur, initial encounter for fracture: Secondary | ICD-10-CM | POA: Diagnosis not present

## 2017-08-03 DIAGNOSIS — R296 Repeated falls: Secondary | ICD-10-CM | POA: Diagnosis not present

## 2017-08-03 DIAGNOSIS — Z9181 History of falling: Secondary | ICD-10-CM | POA: Diagnosis not present

## 2017-08-03 DIAGNOSIS — F0281 Dementia in other diseases classified elsewhere with behavioral disturbance: Secondary | ICD-10-CM | POA: Diagnosis not present

## 2017-08-03 DIAGNOSIS — R2689 Other abnormalities of gait and mobility: Secondary | ICD-10-CM | POA: Diagnosis not present

## 2017-08-03 DIAGNOSIS — M25551 Pain in right hip: Secondary | ICD-10-CM | POA: Diagnosis not present

## 2017-08-07 DIAGNOSIS — N390 Urinary tract infection, site not specified: Secondary | ICD-10-CM | POA: Diagnosis not present

## 2017-08-07 DIAGNOSIS — R319 Hematuria, unspecified: Secondary | ICD-10-CM | POA: Diagnosis not present

## 2017-08-07 DIAGNOSIS — R300 Dysuria: Secondary | ICD-10-CM | POA: Diagnosis not present

## 2017-08-08 DIAGNOSIS — M25551 Pain in right hip: Secondary | ICD-10-CM | POA: Diagnosis not present

## 2017-08-08 DIAGNOSIS — R296 Repeated falls: Secondary | ICD-10-CM | POA: Diagnosis not present

## 2017-08-08 DIAGNOSIS — F0281 Dementia in other diseases classified elsewhere with behavioral disturbance: Secondary | ICD-10-CM | POA: Diagnosis not present

## 2017-08-08 DIAGNOSIS — M84451A Pathological fracture, right femur, initial encounter for fracture: Secondary | ICD-10-CM | POA: Diagnosis not present

## 2017-08-08 DIAGNOSIS — Z9181 History of falling: Secondary | ICD-10-CM | POA: Diagnosis not present

## 2017-08-08 DIAGNOSIS — R2689 Other abnormalities of gait and mobility: Secondary | ICD-10-CM | POA: Diagnosis not present

## 2017-08-09 DIAGNOSIS — M25551 Pain in right hip: Secondary | ICD-10-CM | POA: Diagnosis not present

## 2017-08-09 DIAGNOSIS — R2689 Other abnormalities of gait and mobility: Secondary | ICD-10-CM | POA: Diagnosis not present

## 2017-08-09 DIAGNOSIS — R296 Repeated falls: Secondary | ICD-10-CM | POA: Diagnosis not present

## 2017-08-09 DIAGNOSIS — Z9181 History of falling: Secondary | ICD-10-CM | POA: Diagnosis not present

## 2017-08-09 DIAGNOSIS — M84451A Pathological fracture, right femur, initial encounter for fracture: Secondary | ICD-10-CM | POA: Diagnosis not present

## 2017-08-09 DIAGNOSIS — F0281 Dementia in other diseases classified elsewhere with behavioral disturbance: Secondary | ICD-10-CM | POA: Diagnosis not present

## 2017-08-10 DIAGNOSIS — M25551 Pain in right hip: Secondary | ICD-10-CM | POA: Diagnosis not present

## 2017-08-10 DIAGNOSIS — Z9181 History of falling: Secondary | ICD-10-CM | POA: Diagnosis not present

## 2017-08-10 DIAGNOSIS — M84451A Pathological fracture, right femur, initial encounter for fracture: Secondary | ICD-10-CM | POA: Diagnosis not present

## 2017-08-10 DIAGNOSIS — R296 Repeated falls: Secondary | ICD-10-CM | POA: Diagnosis not present

## 2017-08-10 DIAGNOSIS — R2689 Other abnormalities of gait and mobility: Secondary | ICD-10-CM | POA: Diagnosis not present

## 2017-08-10 DIAGNOSIS — F0281 Dementia in other diseases classified elsewhere with behavioral disturbance: Secondary | ICD-10-CM | POA: Diagnosis not present

## 2017-08-15 ENCOUNTER — Encounter: Payer: Self-pay | Admitting: Internal Medicine

## 2017-08-16 DIAGNOSIS — M84451A Pathological fracture, right femur, initial encounter for fracture: Secondary | ICD-10-CM | POA: Diagnosis not present

## 2017-08-16 DIAGNOSIS — R296 Repeated falls: Secondary | ICD-10-CM | POA: Diagnosis not present

## 2017-08-16 DIAGNOSIS — F0281 Dementia in other diseases classified elsewhere with behavioral disturbance: Secondary | ICD-10-CM | POA: Diagnosis not present

## 2017-08-16 DIAGNOSIS — M25551 Pain in right hip: Secondary | ICD-10-CM | POA: Diagnosis not present

## 2017-08-16 DIAGNOSIS — Z9181 History of falling: Secondary | ICD-10-CM | POA: Diagnosis not present

## 2017-08-16 DIAGNOSIS — R2689 Other abnormalities of gait and mobility: Secondary | ICD-10-CM | POA: Diagnosis not present

## 2017-08-17 DIAGNOSIS — R2689 Other abnormalities of gait and mobility: Secondary | ICD-10-CM | POA: Diagnosis not present

## 2017-08-17 DIAGNOSIS — F0281 Dementia in other diseases classified elsewhere with behavioral disturbance: Secondary | ICD-10-CM | POA: Diagnosis not present

## 2017-08-17 DIAGNOSIS — Z9181 History of falling: Secondary | ICD-10-CM | POA: Diagnosis not present

## 2017-08-17 DIAGNOSIS — R296 Repeated falls: Secondary | ICD-10-CM | POA: Diagnosis not present

## 2017-08-17 DIAGNOSIS — M25551 Pain in right hip: Secondary | ICD-10-CM | POA: Diagnosis not present

## 2017-08-17 DIAGNOSIS — M84451A Pathological fracture, right femur, initial encounter for fracture: Secondary | ICD-10-CM | POA: Diagnosis not present

## 2017-08-20 DIAGNOSIS — F0281 Dementia in other diseases classified elsewhere with behavioral disturbance: Secondary | ICD-10-CM | POA: Diagnosis not present

## 2017-08-20 DIAGNOSIS — M84451A Pathological fracture, right femur, initial encounter for fracture: Secondary | ICD-10-CM | POA: Diagnosis not present

## 2017-08-20 DIAGNOSIS — R296 Repeated falls: Secondary | ICD-10-CM | POA: Diagnosis not present

## 2017-08-20 DIAGNOSIS — R2689 Other abnormalities of gait and mobility: Secondary | ICD-10-CM | POA: Diagnosis not present

## 2017-08-20 DIAGNOSIS — Z9181 History of falling: Secondary | ICD-10-CM | POA: Diagnosis not present

## 2017-08-20 DIAGNOSIS — M25551 Pain in right hip: Secondary | ICD-10-CM | POA: Diagnosis not present

## 2017-08-24 ENCOUNTER — Encounter: Payer: Medicare Other | Attending: Physical Medicine & Rehabilitation | Admitting: Physical Medicine & Rehabilitation

## 2017-08-24 ENCOUNTER — Other Ambulatory Visit: Payer: Self-pay

## 2017-08-24 ENCOUNTER — Encounter: Payer: Self-pay | Admitting: Physical Medicine & Rehabilitation

## 2017-08-24 VITALS — BP 157/79 | HR 71

## 2017-08-24 DIAGNOSIS — M8580 Other specified disorders of bone density and structure, unspecified site: Secondary | ICD-10-CM | POA: Insufficient documentation

## 2017-08-24 DIAGNOSIS — M810 Age-related osteoporosis without current pathological fracture: Secondary | ICD-10-CM

## 2017-08-24 DIAGNOSIS — M791 Myalgia, unspecified site: Secondary | ICD-10-CM | POA: Diagnosis not present

## 2017-08-24 DIAGNOSIS — G8929 Other chronic pain: Secondary | ICD-10-CM | POA: Diagnosis not present

## 2017-08-24 DIAGNOSIS — M542 Cervicalgia: Secondary | ICD-10-CM | POA: Diagnosis not present

## 2017-08-24 DIAGNOSIS — F329 Major depressive disorder, single episode, unspecified: Secondary | ICD-10-CM | POA: Diagnosis not present

## 2017-08-24 DIAGNOSIS — G243 Spasmodic torticollis: Secondary | ICD-10-CM

## 2017-08-24 DIAGNOSIS — R251 Tremor, unspecified: Secondary | ICD-10-CM

## 2017-08-24 DIAGNOSIS — M4722 Other spondylosis with radiculopathy, cervical region: Secondary | ICD-10-CM | POA: Diagnosis not present

## 2017-08-24 DIAGNOSIS — Z87891 Personal history of nicotine dependence: Secondary | ICD-10-CM | POA: Diagnosis not present

## 2017-08-24 DIAGNOSIS — R269 Unspecified abnormalities of gait and mobility: Secondary | ICD-10-CM

## 2017-08-24 DIAGNOSIS — G479 Sleep disorder, unspecified: Secondary | ICD-10-CM

## 2017-08-24 DIAGNOSIS — F0280 Dementia in other diseases classified elsewhere without behavioral disturbance: Secondary | ICD-10-CM | POA: Diagnosis not present

## 2017-08-24 DIAGNOSIS — G309 Alzheimer's disease, unspecified: Secondary | ICD-10-CM | POA: Insufficient documentation

## 2017-08-24 MED ORDER — DICLOFENAC SODIUM 1 % TD GEL
2.0000 g | Freq: Four times a day (QID) | TRANSDERMAL | 1 refills | Status: DC
Start: 2017-08-24 — End: 2017-10-03

## 2017-08-24 MED ORDER — BACLOFEN 10 MG PO TABS: 5.0000 mg | ORAL_TABLET | Freq: Three times a day (TID) | ORAL | 1 refills | Status: DC

## 2017-08-24 NOTE — Addendum Note (Signed)
Addended by: Delice Lesch A on: 08/24/2017 11:23 AM   Modules accepted: Level of Service

## 2017-08-24 NOTE — Progress Notes (Addendum)
Subjective:    Patient ID: Melanie Freeman, female    DOB: 1929/01/17, 83 y.o.   MRN: [id number]  HPI 83 y/o female with pmh of cervical spondylosis with radiculopathy, alzheimer, depression/anxiety, OA presents with neck pain.  Daughter present, who provides some of history.  Started ~2017.  Denies inciting event.  Getting worse. Denies alleviating factors.  ROM exacerbates symptoms.  Non-radiating.  Constant for the most part.  Heat helps temporarily. PT/OT with no benefit.  Associated headaches and tingling.  Several falls due to balance.  She recently started using wheelchair.  Pain makes life uncomfortable.  Currently resides in ALF.    Pain Inventory Average Pain did not answer.  Pain is in neck trapezius area, headaches, unable to turn head Pain Right Now not answered My pain is constant  In the last 24 hours, has pain interfered with the following? General activity 8 Relation with others 8 Enjoyment of life 8 What TIME of day is your pain at its worst? morning Sleep (in general) Poor  Pain is worse with: walking, bending, standing and trying to turn head Pain improves with: heat/ice and biofreeze Relief from Meds: 2  Mobility use a walker ability to climb steps?  no do you drive?  no use a wheelchair needs help with transfers  Function retired I need assistance with the following:  dressing, bathing, meal prep, household duties and shopping  Neuro/Psych tremor trouble walking confusion depression anxiety  Prior Studies Any changes since last visit?  no  Physicians involved in your care Any changes since last visit?  no Primary care Dr Mariea Clonts Neurologist Dr Jaynee Eagles Orthopedist Dr Laurance Flatten   Family History  Problem Relation Age of Onset  . Dementia Mother   . Cancer Father        pancreatic  . Hepatitis Brother    Social History   Socioeconomic History  . Marital status: Widowed    Spouse name: None  . Number of children: 2  . Years of education: 63    . Highest education level: None  Social Needs  . Financial resource strain: None  . Food insecurity - worry: None  . Food insecurity - inability: None  . Transportation needs - medical: None  . Transportation needs - non-medical: None  Occupational History  . Occupation: stay at home mom  Tobacco Use  . Smoking status: Former Smoker    Years: 5.00  . Smokeless tobacco: Never Used  Substance and Sexual Activity  . Alcohol use: No  . Drug use: No  . Sexual activity: No  Other Topics Concern  . None  Social History Narrative   Lives at [place] moved in 04/02/15   Widow   Never smoked   Alcohol none   Exercise walking, leg exercise in bed twice daily    Walks w/ cane/walker   POA, Living Will   Left-handed   No caffeine   Past Surgical History:  Procedure Laterality Date  . APPENDECTOMY    . DILATATION & CURETTAGE/HYSTEROSCOPY WITH MYOSURE N/A 06/26/2015   Procedure: DILATATION & CURETTAGE/HYSTEROSCOPY WITH MYOSURE;  Surgeon: Arvella Nigh, MD;  Location: [facility];  Service: Gynecology;  Laterality: N/A;  . FRACTURE SURGERY  2015   left hip fracture  . HIP ORIF W/ CAPSULOTOMY Left 10/22/2013   [name]   . RIGHT hip percutaneous pinning of impacted femoral neck fracture  01/26/16   [facility]--Dr. Jaynee Eagles orthopedics  . TONSILLECTOMY     Past Medical History:  Diagnosis  Date  . Allergic contact dermatitis   . Anxiety   . Arthritis   . Closed fracture of neck of femur ([place]) 01/25/2016  . Cystitis, interstitial   . Depression   . Femur fracture, right ([place]) 01/26/16  . Fracture of right inferior pubic ramus ([place]) 01/26/16  . Hepatitis    over 50 years ago  . Long-term use of high-risk medication   . Osteopenia   . Tremors of nervous system   . Vertigo    BP (!) 157/79   Pulse 71   SpO2 92%   Opioid Risk Score:   Fall Risk Score:  `1  Depression screen PHQ 2/9  Depression screen [facility] 2/9 08/24/2017 02/14/2017 04/29/2015  Decreased Interest 3 0 0  Down,  Depressed, Hopeless 1 0 0  PHQ - 2 Score 4 0 0  Altered sleeping 2 - -  Tired, decreased energy 3 - -  Change in appetite 3 - -  Feeling bad or failure about yourself  0 - -  Trouble concentrating 0 - -  Moving slowly or fidgety/restless 0 - -  Suicidal thoughts 0 - -  PHQ-9 Score 12 - -    Review of Systems  Constitutional: Positive for appetite change and unexpected weight change.       Poor appetite  HENT: Negative.   Eyes: Negative.   Respiratory: Negative.   Cardiovascular: Negative.   Gastrointestinal: Negative.   Endocrine: Negative.   Genitourinary: Negative.   Musculoskeletal: Positive for gait problem, neck pain and neck stiffness.  Skin: Negative.   Allergic/Immunologic: Negative.   Neurological: Positive for tremors.  Hematological: Negative.   Psychiatric/Behavioral: Positive for confusion and dysphoric mood. The patient is nervous/anxious.   All other systems reviewed and are negative.      Objective:   Physical Exam Gen: NAD. Vital signs reviewed HENT: Normocephalic, Atraumatic Eyes: EOMI. No discharge.  Cardio: RRR. No JVD. Pulm: B/l clear to auscultation.  Effort normal Abd: Soft, BS+ MSK:     TTP right neck >> SCM.    No edema.   Limitted ROM in neck, >>limitation with rotation on right  Taut right SCM Neuro: CN II-XII grossly intact.    Sensation intact to light touch in all UE dermatomes  Reflexes > RUE vs. LUE  Strength  4+/5 in all LUE myotomes    4-/5 in all RUE myotomes  Neg hoffman's b/l  Neg Spurling's b/l  Head/hand tremor Skin: Warm and Dry. Intact    Assessment & Plan:  83 y/o female with pmh of cervical spondylosis with radiculopathy, alzheimer, depression/anxiety, OA presents with neck pain.  1. Chronic neck pain  Multifactorial due to spinal stensosis/DDD, and spasmodic torticollis  MRI from 09/13/2016 reviewed, showing C2-3 disc protrusion with spinal cord Impingement and disc degeneration C3-6.  Labs reviewed  Referral  information reviewed  Cont Heat  Will consider retrial with PT  Will order Voltaren gel  Will order Baclofen 5 TID  Will order Vitamin D level  Will consider Gabapentin  Will consider TENS  Will schedule for Botox injection   2. Gait abnormality  Cont wheelchair for safety  Will consider retrial PT  Will order Vitamin B12  3. Sleep disturbance  See #1  4. Myalgia   Will consider tigger point injections, however, I do not believe this is predominant etiology of pain, see #1  5. Tremors  In head and hands  Will consider trial of medication in future  >60 minutes spent with patient  and daughter with >50 minutes in counseling regarding pain, etiology, treatment plans

## 2017-08-25 DIAGNOSIS — R296 Repeated falls: Secondary | ICD-10-CM | POA: Diagnosis not present

## 2017-08-25 DIAGNOSIS — R2689 Other abnormalities of gait and mobility: Secondary | ICD-10-CM | POA: Diagnosis not present

## 2017-08-25 DIAGNOSIS — M25551 Pain in right hip: Secondary | ICD-10-CM | POA: Diagnosis not present

## 2017-08-25 DIAGNOSIS — M84451A Pathological fracture, right femur, initial encounter for fracture: Secondary | ICD-10-CM | POA: Diagnosis not present

## 2017-08-25 DIAGNOSIS — F0281 Dementia in other diseases classified elsewhere with behavioral disturbance: Secondary | ICD-10-CM | POA: Diagnosis not present

## 2017-08-25 DIAGNOSIS — Z9181 History of falling: Secondary | ICD-10-CM | POA: Diagnosis not present

## 2017-08-25 LAB — VITAMIN B12: Vitamin B-12: 1901 pg/mL — ABNORMAL HIGH (ref 232–1245)

## 2017-08-25 LAB — VITAMIN D 25 HYDROXY (VIT D DEFICIENCY, FRACTURES): Vit D, 25-Hydroxy: 40.4 ng/mL (ref 30.0–100.0)

## 2017-08-30 ENCOUNTER — Other Ambulatory Visit: Payer: Self-pay

## 2017-08-30 ENCOUNTER — Non-Acute Institutional Stay (SKILLED_NURSING_FACILITY): Payer: Medicare Other | Admitting: Internal Medicine

## 2017-08-30 ENCOUNTER — Telehealth: Payer: Self-pay

## 2017-08-30 ENCOUNTER — Encounter: Payer: Self-pay | Admitting: Internal Medicine

## 2017-08-30 DIAGNOSIS — F0281 Dementia in other diseases classified elsewhere with behavioral disturbance: Secondary | ICD-10-CM | POA: Diagnosis not present

## 2017-08-30 DIAGNOSIS — Z9181 History of falling: Secondary | ICD-10-CM | POA: Diagnosis not present

## 2017-08-30 DIAGNOSIS — F324 Major depressive disorder, single episode, in partial remission: Secondary | ICD-10-CM

## 2017-08-30 DIAGNOSIS — G301 Alzheimer's disease with late onset: Secondary | ICD-10-CM | POA: Diagnosis not present

## 2017-08-30 DIAGNOSIS — F0280 Dementia in other diseases classified elsewhere without behavioral disturbance: Secondary | ICD-10-CM | POA: Diagnosis not present

## 2017-08-30 DIAGNOSIS — M25551 Pain in right hip: Secondary | ICD-10-CM | POA: Diagnosis not present

## 2017-08-30 DIAGNOSIS — T50905A Adverse effect of unspecified drugs, medicaments and biological substances, initial encounter: Secondary | ICD-10-CM

## 2017-08-30 DIAGNOSIS — M84451A Pathological fracture, right femur, initial encounter for fracture: Secondary | ICD-10-CM | POA: Diagnosis not present

## 2017-08-30 DIAGNOSIS — R2689 Other abnormalities of gait and mobility: Secondary | ICD-10-CM | POA: Diagnosis not present

## 2017-08-30 DIAGNOSIS — F19921 Other psychoactive substance use, unspecified with intoxication with delirium: Secondary | ICD-10-CM

## 2017-08-30 DIAGNOSIS — R410 Disorientation, unspecified: Secondary | ICD-10-CM

## 2017-08-30 DIAGNOSIS — R296 Repeated falls: Secondary | ICD-10-CM | POA: Diagnosis not present

## 2017-08-30 NOTE — Progress Notes (Signed)
Patient ID: Melanie Freeman, female   DOB: 05-30-1929, 84 y.o.   MRN: [id number]  Provider:  Rexene Edison. Mariea Clonts, D.O., C.M.D. Location:  [facility] Room Number: [place] of Service:     PCP: Gayland Curry, DO Patient Care Team: Gayland Curry, DO as PCP - General (Geriatric Medicine) Bjorn Loser, MD as Consulting Physician (Urology)  Extended Emergency Contact Information Primary Emergency Contact: [facility] Address: [address]          [place], [place] [address] Johnnette Litter of [place] Work Phone: [phone] Mobile Phone: [phone] Relation: Daughter Secondary Emergency Contact: [facility] Address: [address]          [place], [place] [address] Johnnette Litter of [place] Mobile Phone: [phone] Relation: Other  Code Status: DNR Goals of Care: Advanced Directive information Advanced Directives 06/27/2017  Does Patient Have a Medical Advance Directive? Yes  Type of Advance Directive [place]  Does patient want to make changes to medical advance directive? Yes (Inpatient - patient requests chaplain consult to change a medical advance directive)  Copy of [place] in Chart? No - copy requested  Would patient like information on creating a medical advance directive? No - Patient declined  Pre-existing out of facility DNR order (yellow form or pink MOST form) -   Chief Complaint  Patient presents with  . New Admit To SNF    Rehab admission    HPI: Patient is a 84 y.o. female seen today for admission to [facility] rehab from AL due to delirium from muscle relaxer for neck pain and increased need for assistance even with feeding at the time of transfer.  She was apparently removing her clothes and acting inappropriately.  Baclofen was discontinued and pt has returned to baseline.  Overall, her dementia has been progressive and she needs a change in level of care to SNF anyway.      When seen, she was lying on an angle across the single bed of the snf room with her head almost off the side and not on a pillow (? Odd posture affecting the neck pain to start with) Past Medical History:  Diagnosis Date  . Allergic contact dermatitis   . Anxiety   . Arthritis   . Closed fracture of neck of femur ([place]) 01/25/2016  . Cystitis, interstitial   . Depression   . Femur fracture, right ([place]) 01/26/16  . Fracture of right inferior pubic ramus ([place]) 01/26/16  . Hepatitis    over 50 years ago  . Long-term use of high-risk medication   . Osteopenia   . Tremors of nervous system   . Vertigo    Past Surgical History:  Procedure Laterality Date  . APPENDECTOMY    . DILATATION & CURETTAGE/HYSTEROSCOPY WITH MYOSURE N/A 06/26/2015   Procedure: DILATATION & CURETTAGE/HYSTEROSCOPY WITH MYOSURE;  Surgeon: Arvella Nigh, MD;  Location: [facility];  Service: Gynecology;  Laterality: N/A;  . FRACTURE SURGERY  2015   left hip fracture  . HIP ORIF W/ CAPSULOTOMY Left 10/22/2013   [name]   . RIGHT hip percutaneous pinning of impacted femoral neck fracture  01/26/16   [facility]--Dr. Jaynee Eagles orthopedics  . TONSILLECTOMY      reports that she has quit smoking. She quit after 5.00 years of use. she has never used smokeless tobacco. She reports that she does not drink alcohol or use drugs. Social History   Socioeconomic History  . Marital status: Widowed  Spouse name: Not on file  . Number of children: 2  . Years of education: 40  . Highest education level: Not on file  Social Needs  . Financial resource strain: Not on file  . Food insecurity - worry: Not on file  . Food insecurity - inability: Not on file  . Transportation needs - medical: Not on file  . Transportation needs - non-medical: Not on file  Occupational History  . Occupation: stay at home mom  Tobacco Use  . Smoking status: Former Smoker    Years: 5.00  . Smokeless tobacco: Never Used  Substance and Sexual  Activity  . Alcohol use: No  . Drug use: No  . Sexual activity: No  Other Topics Concern  . Not on file  Social History Narrative   Lives at [place] moved in 04/02/15   Widow   Never smoked   Alcohol none   Exercise walking, leg exercise in bed twice daily    Walks w/ cane/walker   POA, Living Will   Left-handed   No caffeine    Functional Status Survey:    Family History  Problem Relation Age of Onset  . Dementia Mother   . Cancer Father        pancreatic  . Hepatitis Brother     Health Maintenance  Topic Date Due  . TETANUS/TDAP  08/29/2018 (Originally 07/20/1948)  . PNA vac Low Risk Adult (2 of 2 - PPSV23) 08/29/2018 (Originally 09/28/2015)  . INFLUENZA VACCINE  Completed  . DEXA SCAN  Completed    Allergies  Allergen Reactions  . Ham Other (See Comments)  . Latex Rash  . Other Other (See Comments)    Pt reports was seen by neurologist for tremors and was allergic to all medications prescribed for tremors, but does not know names or reaction. Patient reports being allergic to polyester. Does not know what reaction would be. States she was seen by a doctor who tested her for multiple allergies and she had positive results.  . Ciprofloxacin Other (See Comments)    Doesn't work  . Doxycycline Other (See Comments)    unknown  . Erythromycin     Noted from Neurologist in [place]  . Hydrocortisone   . Levaquin [Levofloxacin In D5w] Other (See Comments)    unknown  . Lopid [Gemfibrozil]   . Macrobid [employer] Macro] Other (See Comments)    Doesn't work  . Sulfa Antibiotics Other (See Comments)    unknown    Outpatient Encounter Medications as of 08/30/2017  Medication Sig  . acetaminophen (TYLENOL) 325 MG tablet Take 650 mg by mouth 4 (four) times daily.  . cephALEXin (KEFLEX) 250 MG capsule Take 250 mg by mouth daily before lunch.  . Cholecalciferol (D3 HIGH POTENCY) 1000 UNITS capsule Take 1,000 Units by mouth daily.   . citalopram (CELEXA)  20 MG tablet Take 20 mg by mouth daily.  . diclofenac sodium (VOLTAREN) 1 % GEL Apply 2 g topically 4 (four) times daily.  Marland Kitchen docusate sodium (COLACE) 100 MG capsule Take 200 mg by mouth daily.  . feeding supplement (BOOST HIGH PROTEIN) LIQD Take 1 Container by mouth daily.  . fluocinolone (VANOS) 0.01 % cream Apply 1 application topically 2 (two) times daily as needed (skin rash).  . memantine (NAMENDA XR) 7 MG CP24 24 hr capsule Take 1 capsule (7 mg total) by mouth daily.  . Menthol, Topical Analgesic, (BIOFREEZE EX) Apply topically 2 (two) times daily.  . mirabegron ER (MYRBETRIQ)  50 MG TB24 tablet Take 50 mg by mouth daily.  [name] Glycol-Propyl Glycol (SYSTANE OP) Place 1 drop into both eyes 4 (four) times daily.  . raloxifene (EVISTA) 60 MG tablet Take 60 mg by mouth daily.  . vitamin B-12 (CYANOCOBALAMIN) 1000 MCG tablet Take 1,000 mcg by mouth every other day.  . [DISCONTINUED] baclofen (LIORESAL) 10 MG tablet Take 0.5 tablets (5 mg total) by mouth 3 (three) times daily.  . [DISCONTINUED] fexofenadine (ALLEGRA) 60 MG tablet Take 60 mg by mouth daily.  . [DISCONTINUED] raloxifene (EVISTA) 60 MG tablet Take 1 tablet (60 mg total) by mouth daily. Take one table daily (Patient taking differently: Take 60 mg by mouth daily. )   No facility-administered encounter medications on file as of 08/30/2017.     Review of Systems  Constitutional: Positive for activity change. Negative for appetite change, chills and fever.  HENT: Negative for congestion.   Eyes: Negative for visual disturbance.  Respiratory: Negative for chest tightness and shortness of breath.   Cardiovascular: Negative for chest pain and leg swelling.  Gastrointestinal: Negative for abdominal distention, abdominal pain and constipation.  Genitourinary: Negative for dysuria.  Musculoskeletal: Positive for arthralgias, gait problem, myalgias and neck pain.  Skin: Negative for color change.  Neurological: Positive for  headaches. Negative for dizziness and weakness.  Psychiatric/Behavioral: Positive for confusion. The patient is nervous/anxious.     Vitals:   08/30/17 1557  BP: 140/68  Pulse: 74  Resp: 18  Temp: 97.9 F (36.6 C)  TempSrc: Oral  SpO2: 95%  Weight: 102 lb (46.3 kg)   Body mass index is 19.92 kg/m. Physical Exam  Constitutional:  Frail white female  Cardiovascular: Normal rate, regular rhythm, normal heart sounds and intact distal pulses.  Pulmonary/Chest: Effort normal and breath sounds normal. No respiratory distress.  Abdominal: Soft. Bowel sounds are normal. She exhibits no distension. There is no tenderness.  Musculoskeletal: Normal range of motion.  Uses walker to get around, but weaker, getting PT, OT now  Neurological: She is alert.  Oriented to person, not place or time at present  Skin: Skin is warm and dry.  Psychiatric: She has a normal mood and affect.    Labs reviewed: Basic Metabolic Panel: No results for input(s): NA, K, CL, CO2, GLUCOSE, BUN, CREATININE, CALCIUM, MG, PHOS in the last 8760 hours. Liver Function Tests: No results for input(s): AST, ALT, ALKPHOS, BILITOT, PROT, ALBUMIN in the last 8760 hours. No results for input(s): LIPASE, AMYLASE in the last 8760 hours. No results for input(s): AMMONIA in the last 8760 hours. CBC: No results for input(s): WBC, NEUTROABS, HGB, HCT, MCV, PLT in the last 8760 hours. Cardiac Enzymes: No results for input(s): CKTOTAL, CKMB, CKMBINDEX, TROPONINI in the last 8760 hours. BNP: Invalid input(s): POCBNP No results found for: HGBA1C Lab Results  Component Value Date   TSH 2.19 03/04/2016   Lab Results  Component Value Date   VITAMINB12 1,901 (H) 08/24/2017    Assessment/Plan 1. Acute hyperactive medication-induced delirium ([place]) -resolved with stopping baclofen  2. Major depressive disorder with single episode, in partial remission (HCC) -stable mood, cont celexa  3. Late onset Alzheimer's disease  without behavioral disturbance -cont namenda XR at this point, needs SNF level of care now--if possible, will be moved directly to SNF, but if takes too long, may need to return to AL with 24 hr care short term first, then move  Family/ staff Communication: been communicating with dtr, Beth via mychart, discussed with rehab  nurse and nurse manager  Labs/tests ordered:  No new  Belkis Norbeck L. Tywon Niday, D.O. [facility] [address], [place] [address] Cell Phone (Mon-Fri 8am-5pm):  [phone] On Call:  [phone] & follow prompts after 5pm & weekends Office Phone:  [phone] Office Fax:  [phone]

## 2017-08-30 NOTE — Telephone Encounter (Signed)
Recieved electronic prior auth request for this patient for voltaren gel, started today and recieved approved status today. 08-30-17

## 2017-08-31 ENCOUNTER — Encounter: Payer: Self-pay | Admitting: Internal Medicine

## 2017-08-31 DIAGNOSIS — F0281 Dementia in other diseases classified elsewhere with behavioral disturbance: Secondary | ICD-10-CM | POA: Diagnosis not present

## 2017-08-31 DIAGNOSIS — Z9181 History of falling: Secondary | ICD-10-CM | POA: Diagnosis not present

## 2017-08-31 DIAGNOSIS — M25551 Pain in right hip: Secondary | ICD-10-CM | POA: Diagnosis not present

## 2017-08-31 DIAGNOSIS — M84451A Pathological fracture, right femur, initial encounter for fracture: Secondary | ICD-10-CM | POA: Diagnosis not present

## 2017-08-31 DIAGNOSIS — R296 Repeated falls: Secondary | ICD-10-CM | POA: Diagnosis not present

## 2017-08-31 DIAGNOSIS — R2689 Other abnormalities of gait and mobility: Secondary | ICD-10-CM | POA: Diagnosis not present

## 2017-09-01 DIAGNOSIS — R2689 Other abnormalities of gait and mobility: Secondary | ICD-10-CM | POA: Diagnosis not present

## 2017-09-01 DIAGNOSIS — Z9181 History of falling: Secondary | ICD-10-CM | POA: Diagnosis not present

## 2017-09-01 DIAGNOSIS — F0281 Dementia in other diseases classified elsewhere with behavioral disturbance: Secondary | ICD-10-CM | POA: Diagnosis not present

## 2017-09-01 DIAGNOSIS — R296 Repeated falls: Secondary | ICD-10-CM | POA: Diagnosis not present

## 2017-09-01 DIAGNOSIS — M25551 Pain in right hip: Secondary | ICD-10-CM | POA: Diagnosis not present

## 2017-09-01 DIAGNOSIS — M84451A Pathological fracture, right femur, initial encounter for fracture: Secondary | ICD-10-CM | POA: Diagnosis not present

## 2017-09-05 DIAGNOSIS — R296 Repeated falls: Secondary | ICD-10-CM | POA: Diagnosis not present

## 2017-09-05 DIAGNOSIS — R2689 Other abnormalities of gait and mobility: Secondary | ICD-10-CM | POA: Diagnosis not present

## 2017-09-05 DIAGNOSIS — M25551 Pain in right hip: Secondary | ICD-10-CM | POA: Diagnosis not present

## 2017-09-05 DIAGNOSIS — M84451A Pathological fracture, right femur, initial encounter for fracture: Secondary | ICD-10-CM | POA: Diagnosis not present

## 2017-09-05 DIAGNOSIS — F0281 Dementia in other diseases classified elsewhere with behavioral disturbance: Secondary | ICD-10-CM | POA: Diagnosis not present

## 2017-09-05 DIAGNOSIS — Z9181 History of falling: Secondary | ICD-10-CM | POA: Diagnosis not present

## 2017-09-06 DIAGNOSIS — F0281 Dementia in other diseases classified elsewhere with behavioral disturbance: Secondary | ICD-10-CM | POA: Diagnosis not present

## 2017-09-06 DIAGNOSIS — M25551 Pain in right hip: Secondary | ICD-10-CM | POA: Diagnosis not present

## 2017-09-06 DIAGNOSIS — Z9181 History of falling: Secondary | ICD-10-CM | POA: Diagnosis not present

## 2017-09-06 DIAGNOSIS — R2689 Other abnormalities of gait and mobility: Secondary | ICD-10-CM | POA: Diagnosis not present

## 2017-09-06 DIAGNOSIS — F324 Major depressive disorder, single episode, in partial remission: Secondary | ICD-10-CM | POA: Insufficient documentation

## 2017-09-06 DIAGNOSIS — M84451A Pathological fracture, right femur, initial encounter for fracture: Secondary | ICD-10-CM | POA: Diagnosis not present

## 2017-09-06 DIAGNOSIS — R296 Repeated falls: Secondary | ICD-10-CM | POA: Diagnosis not present

## 2017-09-07 ENCOUNTER — Encounter: Payer: Medicare Other | Attending: Physical Medicine & Rehabilitation | Admitting: Physical Medicine & Rehabilitation

## 2017-09-07 ENCOUNTER — Encounter: Payer: Self-pay | Admitting: Physical Medicine & Rehabilitation

## 2017-09-07 VITALS — BP 122/74 | HR 79

## 2017-09-07 DIAGNOSIS — F329 Major depressive disorder, single episode, unspecified: Secondary | ICD-10-CM | POA: Insufficient documentation

## 2017-09-07 DIAGNOSIS — M531 Cervicobrachial syndrome: Secondary | ICD-10-CM | POA: Diagnosis not present

## 2017-09-07 DIAGNOSIS — M791 Myalgia, unspecified site: Secondary | ICD-10-CM | POA: Insufficient documentation

## 2017-09-07 DIAGNOSIS — F0280 Dementia in other diseases classified elsewhere without behavioral disturbance: Secondary | ICD-10-CM | POA: Insufficient documentation

## 2017-09-07 DIAGNOSIS — G309 Alzheimer's disease, unspecified: Secondary | ICD-10-CM | POA: Diagnosis not present

## 2017-09-07 DIAGNOSIS — M542 Cervicalgia: Secondary | ICD-10-CM | POA: Diagnosis not present

## 2017-09-07 DIAGNOSIS — M8580 Other specified disorders of bone density and structure, unspecified site: Secondary | ICD-10-CM | POA: Diagnosis not present

## 2017-09-07 DIAGNOSIS — Z87891 Personal history of nicotine dependence: Secondary | ICD-10-CM | POA: Insufficient documentation

## 2017-09-07 DIAGNOSIS — R269 Unspecified abnormalities of gait and mobility: Secondary | ICD-10-CM | POA: Diagnosis not present

## 2017-09-07 DIAGNOSIS — M4722 Other spondylosis with radiculopathy, cervical region: Secondary | ICD-10-CM | POA: Insufficient documentation

## 2017-09-07 DIAGNOSIS — G8929 Other chronic pain: Secondary | ICD-10-CM | POA: Insufficient documentation

## 2017-09-07 DIAGNOSIS — G243 Spasmodic torticollis: Secondary | ICD-10-CM | POA: Insufficient documentation

## 2017-09-07 DIAGNOSIS — G248 Other dystonia: Secondary | ICD-10-CM

## 2017-09-07 DIAGNOSIS — G479 Sleep disorder, unspecified: Secondary | ICD-10-CM | POA: Insufficient documentation

## 2017-09-07 NOTE — Progress Notes (Signed)
Botox: Procedure Note Patient Name: Melanie Freeman DOB: May 19, 1929 MRN: [id number]   Procedure: Botulinum toxin administration Guidance: EMG Diagnosis: Cervical dystonia/Laterocollis Attending: Delice Lesch, MD  Date: 09/07/17  Informed consent: Risks, benefits & options of the procedure are explained to the patient (and/or family). The patient elects to proceed with procedure. Risks include but are not limited to weakness, respiratory distress, dry mouth, ptosis, antibody formation, worsening of some areas of function. Benefits include decreased abnormal muscle tone, improved hygiene and positioning, decreased skin breakdown and, in some cases, decreased pain. Options include conservative management with oral antispasticity agents, phenol chemodenervation of nerve or at motor nerve branches. More invasive options include intrathecal balcofen adminstration for appropriate candidates. Surgical options may include tendon lengthening or transposition or, rarely, dorsal rhizotomy.   History/Physical Examination: 84 y.o. with pmh of cervical spondylosis with radiculopathy, alzheimer, depression/anxiety, OA presents with cervical dystonia  Previous Treatments: Range of motion Indication for guidance: Target active muscules  Procedure: Botulinum toxin was mixed with preservative free saline with a dilution of 2cc to 100 units. Targeted limb and muscles were identified. The skin was prepped with alcohol swabs and placement of needle tip in targeted muscle was confirmed using appropriate guidance. Prior to injection, positioning of needle tip outside of blood vessel was determined by pulling back on syringe plunger.  MUSCLE UNITS Right SCM 30 Units   Total units used: 30 70 Units discarded due to unavoidable waste  Complications: None  Plan: Follow up in 6 weeks  Julie Nay Anil Ra Pfiester 3:10 PM

## 2017-09-08 DIAGNOSIS — M25551 Pain in right hip: Secondary | ICD-10-CM | POA: Diagnosis not present

## 2017-09-08 DIAGNOSIS — M84451A Pathological fracture, right femur, initial encounter for fracture: Secondary | ICD-10-CM | POA: Diagnosis not present

## 2017-09-08 DIAGNOSIS — F0281 Dementia in other diseases classified elsewhere with behavioral disturbance: Secondary | ICD-10-CM | POA: Diagnosis not present

## 2017-09-08 DIAGNOSIS — R296 Repeated falls: Secondary | ICD-10-CM | POA: Diagnosis not present

## 2017-09-08 DIAGNOSIS — R2689 Other abnormalities of gait and mobility: Secondary | ICD-10-CM | POA: Diagnosis not present

## 2017-09-08 DIAGNOSIS — Z9181 History of falling: Secondary | ICD-10-CM | POA: Diagnosis not present

## 2017-09-12 DIAGNOSIS — Z9181 History of falling: Secondary | ICD-10-CM | POA: Diagnosis not present

## 2017-09-12 DIAGNOSIS — M84451A Pathological fracture, right femur, initial encounter for fracture: Secondary | ICD-10-CM | POA: Diagnosis not present

## 2017-09-12 DIAGNOSIS — R296 Repeated falls: Secondary | ICD-10-CM | POA: Diagnosis not present

## 2017-09-12 DIAGNOSIS — F0281 Dementia in other diseases classified elsewhere with behavioral disturbance: Secondary | ICD-10-CM | POA: Diagnosis not present

## 2017-09-12 DIAGNOSIS — R2689 Other abnormalities of gait and mobility: Secondary | ICD-10-CM | POA: Diagnosis not present

## 2017-09-12 DIAGNOSIS — M25551 Pain in right hip: Secondary | ICD-10-CM | POA: Diagnosis not present

## 2017-09-13 DIAGNOSIS — F0281 Dementia in other diseases classified elsewhere with behavioral disturbance: Secondary | ICD-10-CM | POA: Diagnosis not present

## 2017-09-13 DIAGNOSIS — R296 Repeated falls: Secondary | ICD-10-CM | POA: Diagnosis not present

## 2017-09-13 DIAGNOSIS — R2689 Other abnormalities of gait and mobility: Secondary | ICD-10-CM | POA: Diagnosis not present

## 2017-09-13 DIAGNOSIS — M25551 Pain in right hip: Secondary | ICD-10-CM | POA: Diagnosis not present

## 2017-09-13 DIAGNOSIS — M84451A Pathological fracture, right femur, initial encounter for fracture: Secondary | ICD-10-CM | POA: Diagnosis not present

## 2017-09-13 DIAGNOSIS — Z9181 History of falling: Secondary | ICD-10-CM | POA: Diagnosis not present

## 2017-09-19 ENCOUNTER — Encounter: Payer: Self-pay | Admitting: Internal Medicine

## 2017-09-19 DIAGNOSIS — F0281 Dementia in other diseases classified elsewhere with behavioral disturbance: Secondary | ICD-10-CM | POA: Diagnosis not present

## 2017-09-19 DIAGNOSIS — M25551 Pain in right hip: Secondary | ICD-10-CM | POA: Diagnosis not present

## 2017-09-19 DIAGNOSIS — M84451A Pathological fracture, right femur, initial encounter for fracture: Secondary | ICD-10-CM | POA: Diagnosis not present

## 2017-09-19 DIAGNOSIS — R296 Repeated falls: Secondary | ICD-10-CM | POA: Diagnosis not present

## 2017-09-19 DIAGNOSIS — Z9181 History of falling: Secondary | ICD-10-CM | POA: Diagnosis not present

## 2017-09-19 DIAGNOSIS — R2689 Other abnormalities of gait and mobility: Secondary | ICD-10-CM | POA: Diagnosis not present

## 2017-09-20 ENCOUNTER — Encounter: Payer: Self-pay | Admitting: Internal Medicine

## 2017-09-25 DIAGNOSIS — Z9181 History of falling: Secondary | ICD-10-CM | POA: Diagnosis not present

## 2017-09-25 DIAGNOSIS — D649 Anemia, unspecified: Secondary | ICD-10-CM | POA: Diagnosis not present

## 2017-09-25 DIAGNOSIS — F0281 Dementia in other diseases classified elsewhere with behavioral disturbance: Secondary | ICD-10-CM | POA: Diagnosis not present

## 2017-09-25 DIAGNOSIS — R300 Dysuria: Secondary | ICD-10-CM | POA: Diagnosis not present

## 2017-09-25 DIAGNOSIS — R634 Abnormal weight loss: Secondary | ICD-10-CM | POA: Diagnosis not present

## 2017-09-25 DIAGNOSIS — D72829 Elevated white blood cell count, unspecified: Secondary | ICD-10-CM | POA: Diagnosis not present

## 2017-09-25 DIAGNOSIS — R296 Repeated falls: Secondary | ICD-10-CM | POA: Diagnosis not present

## 2017-09-25 DIAGNOSIS — E785 Hyperlipidemia, unspecified: Secondary | ICD-10-CM | POA: Diagnosis not present

## 2017-09-25 DIAGNOSIS — M25551 Pain in right hip: Secondary | ICD-10-CM | POA: Diagnosis not present

## 2017-09-25 DIAGNOSIS — M84451A Pathological fracture, right femur, initial encounter for fracture: Secondary | ICD-10-CM | POA: Diagnosis not present

## 2017-09-25 DIAGNOSIS — R350 Frequency of micturition: Secondary | ICD-10-CM | POA: Diagnosis not present

## 2017-09-25 DIAGNOSIS — R2689 Other abnormalities of gait and mobility: Secondary | ICD-10-CM | POA: Diagnosis not present

## 2017-09-25 LAB — LIPID PANEL
Cholesterol: 204 — AB (ref 0–200)
HDL: 51 (ref 35–70)
LDL CALC: 134
Triglycerides: 91 (ref 40–160)

## 2017-09-25 LAB — CBC AND DIFFERENTIAL
HEMATOCRIT: 40 (ref 36–46)
HEMOGLOBIN: 13 (ref 12.0–16.0)
PLATELETS: 201 (ref 150–399)
WBC: 7.8

## 2017-09-25 LAB — BASIC METABOLIC PANEL
BUN: 18 (ref 4–21)
Creatinine: 0.9 (ref 0.5–1.1)
GLUCOSE: 86
Potassium: 4.2 (ref 3.4–5.3)
Sodium: 141 (ref 137–147)

## 2017-09-25 LAB — HEPATIC FUNCTION PANEL
ALT: 8 (ref 7–35)
AST: 19 (ref 13–35)
Alkaline Phosphatase: 61 (ref 25–125)
Bilirubin, Total: 0.5

## 2017-09-27 ENCOUNTER — Encounter: Payer: Self-pay | Admitting: Internal Medicine

## 2017-09-27 DIAGNOSIS — M84451A Pathological fracture, right femur, initial encounter for fracture: Secondary | ICD-10-CM | POA: Diagnosis not present

## 2017-09-27 DIAGNOSIS — R296 Repeated falls: Secondary | ICD-10-CM | POA: Diagnosis not present

## 2017-09-27 DIAGNOSIS — Z9181 History of falling: Secondary | ICD-10-CM | POA: Diagnosis not present

## 2017-09-27 DIAGNOSIS — M25551 Pain in right hip: Secondary | ICD-10-CM | POA: Diagnosis not present

## 2017-09-27 DIAGNOSIS — R2689 Other abnormalities of gait and mobility: Secondary | ICD-10-CM | POA: Diagnosis not present

## 2017-09-27 DIAGNOSIS — F0281 Dementia in other diseases classified elsewhere with behavioral disturbance: Secondary | ICD-10-CM | POA: Diagnosis not present

## 2017-09-29 ENCOUNTER — Encounter: Payer: Self-pay | Admitting: Adult Health

## 2017-09-29 ENCOUNTER — Non-Acute Institutional Stay (SKILLED_NURSING_FACILITY): Payer: Medicare Other | Admitting: Adult Health

## 2017-09-29 DIAGNOSIS — G301 Alzheimer's disease with late onset: Secondary | ICD-10-CM

## 2017-09-29 DIAGNOSIS — F324 Major depressive disorder, single episode, in partial remission: Secondary | ICD-10-CM | POA: Diagnosis not present

## 2017-09-29 DIAGNOSIS — G250 Essential tremor: Secondary | ICD-10-CM | POA: Diagnosis not present

## 2017-09-29 DIAGNOSIS — F0280 Dementia in other diseases classified elsewhere without behavioral disturbance: Secondary | ICD-10-CM

## 2017-09-29 DIAGNOSIS — K5909 Other constipation: Secondary | ICD-10-CM | POA: Diagnosis not present

## 2017-09-29 DIAGNOSIS — M436 Torticollis: Secondary | ICD-10-CM | POA: Insufficient documentation

## 2017-09-29 NOTE — Assessment & Plan Note (Signed)
Followed by Dr. Posey Pronto with apt later this month. I will change her tylenol to prn since frequently refuses to take and it and does not seem to think it helps. I would not add anything else at this time but would rather defer to Dr. Posey Pronto as she is rather sensitive to medication and does not appear to be in significant pain for my visit.

## 2017-09-29 NOTE — Assessment & Plan Note (Signed)
Noted, continue to monitor for signs that this affects her care.

## 2017-09-29 NOTE — Assessment & Plan Note (Signed)
Continue senna s.
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
## 2017-09-29 NOTE — Assessment & Plan Note (Signed)
She appears to be confabulating and is somewhat paranoid of the staff at [facility]. If this worsens we could consider medication but she doesn't seem to be too upset or anxious about her perception so we will continue to monitor for right now.

## 2017-09-29 NOTE — Assessment & Plan Note (Signed)
Remains on celexa, also has a hx of OCD. No depressive symptoms. See comments under dementia.

## 2017-10-03 ENCOUNTER — Encounter: Payer: Self-pay | Admitting: Internal Medicine

## 2017-10-03 ENCOUNTER — Non-Acute Institutional Stay (SKILLED_NURSING_FACILITY): Payer: Medicare Other | Admitting: Internal Medicine

## 2017-10-03 DIAGNOSIS — F0281 Dementia in other diseases classified elsewhere with behavioral disturbance: Secondary | ICD-10-CM

## 2017-10-03 DIAGNOSIS — M25551 Pain in right hip: Secondary | ICD-10-CM | POA: Diagnosis not present

## 2017-10-03 DIAGNOSIS — G301 Alzheimer's disease with late onset: Secondary | ICD-10-CM

## 2017-10-03 DIAGNOSIS — M436 Torticollis: Secondary | ICD-10-CM

## 2017-10-03 DIAGNOSIS — M1611 Unilateral primary osteoarthritis, right hip: Secondary | ICD-10-CM | POA: Diagnosis not present

## 2017-10-03 DIAGNOSIS — R296 Repeated falls: Secondary | ICD-10-CM | POA: Diagnosis not present

## 2017-10-03 DIAGNOSIS — F02818 Dementia in other diseases classified elsewhere, unspecified severity, with other behavioral disturbance: Secondary | ICD-10-CM

## 2017-10-03 DIAGNOSIS — R2689 Other abnormalities of gait and mobility: Secondary | ICD-10-CM | POA: Diagnosis not present

## 2017-10-03 DIAGNOSIS — Z9181 History of falling: Secondary | ICD-10-CM | POA: Diagnosis not present

## 2017-10-03 DIAGNOSIS — M84451A Pathological fracture, right femur, initial encounter for fracture: Secondary | ICD-10-CM | POA: Diagnosis not present
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
## 2017-10-05 DIAGNOSIS — M25551 Pain in right hip: Secondary | ICD-10-CM | POA: Diagnosis not present

## 2017-10-05 DIAGNOSIS — M84451A Pathological fracture, right femur, initial encounter for fracture: Secondary | ICD-10-CM | POA: Diagnosis not present

## 2017-10-05 DIAGNOSIS — R2689 Other abnormalities of gait and mobility: Secondary | ICD-10-CM | POA: Diagnosis not present

## 2017-10-05 DIAGNOSIS — Z9181 History of falling: Secondary | ICD-10-CM | POA: Diagnosis not present

## 2017-10-05 DIAGNOSIS — R296 Repeated falls: Secondary | ICD-10-CM | POA: Diagnosis not present

## 2017-10-05 DIAGNOSIS — F0281 Dementia in other diseases classified elsewhere with behavioral disturbance: Secondary | ICD-10-CM | POA: Diagnosis not present

## 2017-10-12 DIAGNOSIS — M84451A Pathological fracture, right femur, initial encounter for fracture: Secondary | ICD-10-CM | POA: Diagnosis not present

## 2017-10-12 DIAGNOSIS — R296 Repeated falls: Secondary | ICD-10-CM | POA: Diagnosis not present

## 2017-10-12 DIAGNOSIS — R2689 Other abnormalities of gait and mobility: Secondary | ICD-10-CM | POA: Diagnosis not present

## 2017-10-12 DIAGNOSIS — M25551 Pain in right hip: Secondary | ICD-10-CM | POA: Diagnosis not present

## 2017-10-12 DIAGNOSIS — F0281 Dementia in other diseases classified elsewhere with behavioral disturbance: Secondary | ICD-10-CM | POA: Diagnosis not present

## 2017-10-12 DIAGNOSIS — Z9181 History of falling: Secondary | ICD-10-CM | POA: Diagnosis not present

## 2017-10-17 DIAGNOSIS — D485 Neoplasm of uncertain behavior of skin: Secondary | ICD-10-CM | POA: Diagnosis not present

## 2017-10-17 DIAGNOSIS — C44329 Squamous cell carcinoma of skin of other parts of face: Secondary | ICD-10-CM | POA: Diagnosis not present

## 2017-10-17 DIAGNOSIS — Z79899 Other long term (current) drug therapy: Secondary | ICD-10-CM | POA: Diagnosis not present

## 2017-10-17 DIAGNOSIS — D649 Anemia, unspecified: Secondary | ICD-10-CM | POA: Diagnosis not present

## 2017-10-17 LAB — BASIC METABOLIC PANEL
BUN: 19 (ref 4–21)
Creatinine: 1 (ref 0.5–1.1)
Glucose: 88
Potassium: 4.4 (ref 3.4–5.3)
Sodium: 140 (ref 137–147)

## 2017-10-19 ENCOUNTER — Ambulatory Visit: Payer: Medicare Other | Admitting: Physical Medicine & Rehabilitation

## 2017-10-19 DIAGNOSIS — R2689 Other abnormalities of gait and mobility: Secondary | ICD-10-CM | POA: Diagnosis not present

## 2017-10-19 DIAGNOSIS — R296 Repeated falls: Secondary | ICD-10-CM | POA: Diagnosis not present

## 2017-10-19 DIAGNOSIS — M25551 Pain in right hip: Secondary | ICD-10-CM | POA: Diagnosis not present

## 2017-10-19 DIAGNOSIS — F0281 Dementia in other diseases classified elsewhere with behavioral disturbance: Secondary | ICD-10-CM | POA: Diagnosis not present

## 2017-10-19 DIAGNOSIS — Z9181 History of falling: Secondary | ICD-10-CM | POA: Diagnosis not present

## 2017-10-19 DIAGNOSIS — M84451A Pathological fracture, right femur, initial encounter for fracture: Secondary | ICD-10-CM | POA: Diagnosis not present

## 2017-10-26 ENCOUNTER — Encounter: Payer: Self-pay | Admitting: Physical Medicine & Rehabilitation

## 2017-10-26 ENCOUNTER — Encounter: Payer: Medicare Other | Attending: Physical Medicine & Rehabilitation | Admitting: Physical Medicine & Rehabilitation

## 2017-10-26 VITALS — BP 125/75 | HR 70

## 2017-10-26 DIAGNOSIS — G309 Alzheimer's disease, unspecified: Secondary | ICD-10-CM | POA: Insufficient documentation

## 2017-10-26 DIAGNOSIS — F0280 Dementia in other diseases classified elsewhere without behavioral disturbance: Secondary | ICD-10-CM | POA: Diagnosis not present

## 2017-10-26 DIAGNOSIS — M542 Cervicalgia: Secondary | ICD-10-CM

## 2017-10-26 DIAGNOSIS — R269 Unspecified abnormalities of gait and mobility: Secondary | ICD-10-CM | POA: Diagnosis not present

## 2017-10-26 DIAGNOSIS — G8929 Other chronic pain: Secondary | ICD-10-CM | POA: Diagnosis not present

## 2017-10-26 DIAGNOSIS — Z87891 Personal history of nicotine dependence: Secondary | ICD-10-CM | POA: Diagnosis not present

## 2017-10-26 DIAGNOSIS — M791 Myalgia, unspecified site: Secondary | ICD-10-CM | POA: Insufficient documentation

## 2017-10-26 DIAGNOSIS — M8580 Other specified disorders of bone density and structure, unspecified site: Secondary | ICD-10-CM | POA: Diagnosis not present

## 2017-10-26 DIAGNOSIS — M4722 Other spondylosis with radiculopathy, cervical region: Secondary | ICD-10-CM | POA: Diagnosis not present

## 2017-10-26 DIAGNOSIS — R251 Tremor, unspecified: Secondary | ICD-10-CM | POA: Diagnosis not present

## 2017-10-26 DIAGNOSIS — G479 Sleep disorder, unspecified: Secondary | ICD-10-CM | POA: Diagnosis not present

## 2017-10-26 DIAGNOSIS — F329 Major depressive disorder, single episode, unspecified: Secondary | ICD-10-CM | POA: Diagnosis not present

## 2017-10-26 DIAGNOSIS — M531 Cervicobrachial syndrome: Secondary | ICD-10-CM

## 2017-10-26 DIAGNOSIS — G243 Spasmodic torticollis: Secondary | ICD-10-CM | POA: Diagnosis not present

## 2017-10-26 MED ORDER — TIZANIDINE HCL 4 MG PO TABS: 2.0000 mg | ORAL_TABLET | Freq: Two times a day (BID) | ORAL | 1 refills | Status: AC | PRN

## 2017-10-26 MED ORDER — PROPRANOLOL HCL 10 MG PO TABS: 5.0000 mg | ORAL_TABLET | Freq: Three times a day (TID) | ORAL | 1 refills | Status: DC

## 2017-10-26 NOTE — Progress Notes (Signed)
Subjective:    Patient ID: Melanie Freeman, female    DOB: 18-Jun-1929, 84 y.o.   MRN: [id number]  HPI 84 y/o female with pmh of cervical spondylosis with radiculopathy, alzheimer, depression/anxiety, OA presents with neck pain.   Initially stated: Daughter present, who provides some of history.  Started ~2017.  Denies inciting event.  Getting worse. Denies alleviating factors.  ROM exacerbates symptoms.  Non-radiating.  Constant for the most part.  Heat helps temporarily. PT/OT with no benefit.  Associated headaches and tingling.  Several falls due to balance.  She recently started using wheelchair.  Pain makes life uncomfortable.  Currently resides in ALF.    Last clinic visit 09/07/17.  Pt poor historian. At that time she had a Botox injection.  She tolerated the procedure without issue, but notes minimal benefit.    Pain Inventory Average Pain did not answer.  Pain is in neck trapezius area, headaches, unable to turn head Pain Right Now not answered My pain is constant  In the last 24 hours, has pain interfered with the following? General activity 8 Relation with others 8 Enjoyment of life 8 What TIME of day is your pain at its worst? morning Sleep (in general) Poor  Pain is worse with: walking, bending, standing and trying to turn head Pain improves with: heat/ice and biofreeze Relief from Meds: 5  Mobility use a walker ability to climb steps?  no do you drive?  no use a wheelchair needs help with transfers  Function retired I need assistance with the following:  dressing, bathing, meal prep, household duties and shopping  Neuro/Psych tremor trouble walking confusion depression anxiety  Prior Studies Any changes since last visit?  no  Physicians involved in your care Any changes since last visit?  no Primary care Dr Mariea Clonts Neurologist Dr Jaynee Eagles Orthopedist Dr Laurance Flatten   Family History  Problem Relation Age of Onset  . Dementia Mother   . Cancer Father    pancreatic  . Hepatitis Brother    Social History   Socioeconomic History  . Marital status: Widowed    Spouse name: None  . Number of children: 2  . Years of education: 4  . Highest education level: None  Social Needs  . Financial resource strain: None  . Food insecurity - worry: None  . Food insecurity - inability: None  . Transportation needs - medical: None  . Transportation needs - non-medical: None  Occupational History  . Occupation: stay at home mom  Tobacco Use  . Smoking status: Former Smoker    Years: 5.00  . Smokeless tobacco: Never Used  Substance and Sexual Activity  . Alcohol use: No  . Drug use: No  . Sexual activity: No  Other Topics Concern  . None  Social History Narrative   Lives at [facility] moved in 04/02/15   Widow   Never smoked   Alcohol none   Exercise walking, leg exercise in bed twice daily    Walks w/ cane/walker   POA, Living Will   Left-handed   No caffeine   Past Surgical History:  Procedure Laterality Date  . APPENDECTOMY    . DILATATION & CURETTAGE/HYSTEROSCOPY WITH MYOSURE N/A 06/26/2015   Procedure: DILATATION & CURETTAGE/HYSTEROSCOPY WITH MYOSURE;  Surgeon: Arvella Nigh, MD;  Location: [facility];  Service: Gynecology;  Laterality: N/A;  . FRACTURE SURGERY  2015   left hip fracture  . HIP ORIF W/ CAPSULOTOMY Left 10/22/2013   [name]   . RIGHT hip  percutaneous pinning of impacted femoral neck fracture  01/26/16   [facility]--Dr. Jaynee Eagles orthopedics  . TONSILLECTOMY     Past Medical History:  Diagnosis Date  . Allergic contact dermatitis   . Anxiety   . Arthritis   . Closed fracture of neck of femur ([place]) 01/25/2016  . Cystitis, interstitial   . Depression   . Femur fracture, right ([place]) 01/26/16  . Fracture of right inferior pubic ramus ([place]) 01/26/16  . Hepatitis    over 50 years ago  . Long-term use of high-risk medication   . Osteopenia   . Tremors of nervous system   . Vertigo    BP 125/75   Pulse 70   SpO2  95%   Opioid Risk Score:   Fall Risk Score:  `1  Depression screen PHQ 2/9  Depression screen [facility] 2/9 08/24/2017 02/14/2017 04/29/2015  Decreased Interest 3 0 0  Down, Depressed, Hopeless 1 0 0  PHQ - 2 Score 4 0 0  Altered sleeping 2 - -  Tired, decreased energy 3 - -  Change in appetite 3 - -  Feeling bad or failure about yourself  0 - -  Trouble concentrating 0 - -  Moving slowly or fidgety/restless 0 - -  Suicidal thoughts 0 - -  PHQ-9 Score 12 - -    Review of Systems  Constitutional: Negative.        Poor appetite  HENT: Negative.   Eyes: Negative.   Respiratory: Negative.   Cardiovascular: Negative.   Gastrointestinal: Negative.   Endocrine: Negative.   Genitourinary: Negative.   Musculoskeletal: Positive for arthralgias, gait problem, joint swelling, myalgias, neck pain and neck stiffness.  Skin: Positive for rash.  Allergic/Immunologic: Negative.   Neurological: Positive for tremors.  Hematological: Negative.   Psychiatric/Behavioral: Positive for confusion and dysphoric mood. The patient is nervous/anxious.   All other systems reviewed and are negative.      Objective:   Physical Exam Gen: NAD. Vital signs reviewed HENT: Normocephalic, Atraumatic Eyes: EOMI. No discharge.  Cardio: RRR. No JVD. Pulm: B/l clear to auscultation.  Effort normal Abd: Nondistended, BS+ MSK:     HOH  TTP right neck > Trap.    No edema.   Limitted ROM in neck, >>limitation with rotation on right Neuro:    Strength  4+/5 in all LUE myotomes    4-/5 in all RUE myotomes  Neg hoffman's b/l  Neg Spurling's b/l  Head/hand tremor Skin: Warm and Dry. Intact    Assessment & Plan:  84 y/o female with pmh of cervical spondylosis with radiculopathy, alzheimer, depression/anxiety, OA presents with neck pain.  1. Chronic neck pain  Multifactorial due to spinal stensosis/DDD, and spasmodic torticollis - predominantly laterocollis   MRI from 09/13/2016 reviewed, showing C2-3 disc  protrusion with spinal cord Impingement and disc degeneration C3-6.  Cont Heat   Baclofen causes change in mental status  Pt does not recall Votaren gel  Vit D WNL  Will order Voltaren gel  Will order Tizanidine 2mg  BID PRN  Will consider Gabapentin  Will consider TENS  Will consider retrial with PT  Last visit right SCM injected 30U, will increased to 60 U     Will add right Trapezius 40U   2. Gait abnormality  Cont wheelchair for safety  Will consider retrial PT  Vitamin B12 WNL  3. Sleep disturbance  See #1  Improving  4. Myalgia   Will consider tigger point injections, however, I do not believe  this is predominant etiology of pain, see #1  5. Tremors  In head and hands  Will order trail Propranolol 5 TID

## 2017-10-27 ENCOUNTER — Encounter: Payer: Self-pay | Admitting: Internal Medicine

## 2017-10-27 ENCOUNTER — Telehealth: Payer: Self-pay

## 2017-10-27 NOTE — Telephone Encounter (Signed)
She is to apply 2g 4/day to her neck.  She was given a prescription last time, but was not sure if it was being applied.  If the prescription has been misplaced, we may write a new one.  Thanks.

## 2017-10-27 NOTE — Telephone Encounter (Signed)
Patients nurse from her SNF called asking for clarification on the Voltaren Gel and is wondering if we are the ones that need to prescribe the medication.  Please advise

## 2017-10-30 MED ORDER — DICLOFENAC SODIUM 1 % TD GEL: 2.0000 g | Freq: Four times a day (QID) | TRANSDERMAL | 0 refills | Status: DC

## 2017-10-30 NOTE — Telephone Encounter (Signed)
notified

## 2017-10-30 NOTE — Addendum Note (Signed)
Addended by: Caro Hight on: 10/30/2017 09:59 AM   Modules accepted: Orders

## 2017-11-14 ENCOUNTER — Encounter: Payer: Self-pay | Admitting: Internal Medicine

## 2017-11-14 DIAGNOSIS — L570 Actinic keratosis: Secondary | ICD-10-CM | POA: Diagnosis not present

## 2017-11-14 DIAGNOSIS — L814 Other melanin hyperpigmentation: Secondary | ICD-10-CM | POA: Diagnosis not present

## 2017-11-14 DIAGNOSIS — C44329 Squamous cell carcinoma of skin of other parts of face: Secondary | ICD-10-CM | POA: Diagnosis not present

## 2017-11-21 ENCOUNTER — Encounter: Payer: Self-pay | Admitting: Internal Medicine

## 2017-11-21 ENCOUNTER — Non-Acute Institutional Stay (SKILLED_NURSING_FACILITY): Payer: Medicare Other | Admitting: Internal Medicine

## 2017-11-21 DIAGNOSIS — F324 Major depressive disorder, single episode, in partial remission: Secondary | ICD-10-CM | POA: Diagnosis not present

## 2017-11-21 DIAGNOSIS — M436 Torticollis: Secondary | ICD-10-CM

## 2017-11-21 DIAGNOSIS — G301 Alzheimer's disease with late onset: Secondary | ICD-10-CM | POA: Diagnosis not present

## 2017-11-21 DIAGNOSIS — K5909 Other constipation: Secondary | ICD-10-CM

## 2017-11-21 DIAGNOSIS — M1611 Unilateral primary osteoarthritis, right hip: Secondary | ICD-10-CM

## 2017-11-21 DIAGNOSIS — F02818 Dementia in other diseases classified elsewhere, unspecified severity, with other behavioral disturbance: Secondary | ICD-10-CM

## 2017-11-21 DIAGNOSIS — F0281 Dementia in other diseases classified elsewhere with behavioral disturbance: Secondary | ICD-10-CM | POA: Diagnosis not present

## 2017-11-21 DIAGNOSIS — G250 Essential tremor: Secondary | ICD-10-CM | POA: Diagnosis not present
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
## 2017-11-24 ENCOUNTER — Encounter: Payer: Self-pay | Admitting: Internal Medicine

## 2017-12-07 ENCOUNTER — Encounter: Payer: Self-pay | Admitting: Physical Medicine & Rehabilitation

## 2017-12-07 ENCOUNTER — Encounter: Payer: Medicare Other | Attending: Physical Medicine & Rehabilitation | Admitting: Physical Medicine & Rehabilitation

## 2017-12-07 ENCOUNTER — Other Ambulatory Visit: Payer: Self-pay

## 2017-12-07 VITALS — BP 146/83 | HR 84 | Ht 61.0 in | Wt 105.0 lb

## 2017-12-07 DIAGNOSIS — M531 Cervicobrachial syndrome: Secondary | ICD-10-CM

## 2017-12-07 DIAGNOSIS — M791 Myalgia, unspecified site: Secondary | ICD-10-CM | POA: Insufficient documentation

## 2017-12-07 DIAGNOSIS — G8929 Other chronic pain: Secondary | ICD-10-CM | POA: Insufficient documentation

## 2017-12-07 DIAGNOSIS — G243 Spasmodic torticollis: Secondary | ICD-10-CM | POA: Insufficient documentation

## 2017-12-07 DIAGNOSIS — G309 Alzheimer's disease, unspecified: Secondary | ICD-10-CM | POA: Insufficient documentation

## 2017-12-07 DIAGNOSIS — M8580 Other specified disorders of bone density and structure, unspecified site: Secondary | ICD-10-CM | POA: Insufficient documentation

## 2017-12-07 DIAGNOSIS — M4722 Other spondylosis with radiculopathy, cervical region: Secondary | ICD-10-CM | POA: Diagnosis not present

## 2017-12-07 DIAGNOSIS — Z87891 Personal history of nicotine dependence: Secondary | ICD-10-CM | POA: Insufficient documentation

## 2017-12-07 DIAGNOSIS — M40292 Other kyphosis, cervical region: Secondary | ICD-10-CM

## 2017-12-07 DIAGNOSIS — F0280 Dementia in other diseases classified elsewhere without behavioral disturbance: Secondary | ICD-10-CM | POA: Insufficient documentation

## 2017-12-07 DIAGNOSIS — R269 Unspecified abnormalities of gait and mobility: Secondary | ICD-10-CM | POA: Insufficient documentation

## 2017-12-07 DIAGNOSIS — M542 Cervicalgia: Secondary | ICD-10-CM | POA: Diagnosis not present

## 2017-12-07 DIAGNOSIS — G479 Sleep disorder, unspecified: Secondary | ICD-10-CM | POA: Insufficient documentation

## 2017-12-07 DIAGNOSIS — F329 Major depressive disorder, single episode, unspecified: Secondary | ICD-10-CM | POA: Insufficient documentation

## 2017-12-07 NOTE — Progress Notes (Addendum)
Botox: Procedure Note Patient Name: ALEEAH GREENO DOB: Jun 27, 1929 MRN: [id number]  Date: 12/07/17   Procedure: Botulinum toxin administration Guidance: EMG Diagnosis: Cervical dystonia/Laterocollis Attending: Delice Lesch, MD   Informed consent: Risks, benefits & options of the procedure are explained to the patient (and/or family). The patient elects to proceed with procedure. Risks include but are not limited to weakness, respiratory distress, dry mouth, ptosis, antibody formation, worsening of some areas of function. Benefits include decreased abnormal muscle tone, improved hygiene and positioning, decreased skin breakdown and, in some cases, decreased pain. Options include conservative management with oral antispasticity agents, phenol chemodenervation of nerve or at motor nerve branches. More invasive options include intrathecal balcofen adminstration for appropriate candidates. Surgical options may include tendon lengthening or transposition or, rarely, dorsal rhizotomy.   History/Physical Examination: 84 y.o. with pmh of cervical spondylosis with radiculopathy, alzheimer, depression/anxiety, OA presents with cervical dystonia  Limitted ROM in neck, >>limitation with rotation and on right and ability to move to midline on right from shoulder  Previous Treatments: Range of motion Indication for guidance: Target active muscules  Procedure: Botulinum toxin was mixed with preservative free saline with a dilution of 2cc to 100 units. Targeted limb and muscles were identified. The skin was prepped with alcohol swabs and placement of needle tip in targeted muscle was confirmed using appropriate guidance. Prior to injection, positioning of needle tip outside of blood vessel was determined by pulling back on syringe plunger.  MUSCLE UNITS Right SCM 60 Units  Right upper trapezius 40U  Total units used: 321  Complications: None  Plan: Follow up in 6 weeks  Yemaya Barnier Lorie Phenix 3:45 PM

## 2017-12-14 DIAGNOSIS — F333 Major depressive disorder, recurrent, severe with psychotic symptoms: Secondary | ICD-10-CM | POA: Diagnosis not present

## 2017-12-22 ENCOUNTER — Non-Acute Institutional Stay (SKILLED_NURSING_FACILITY): Payer: Medicare Other | Admitting: Adult Health

## 2017-12-22 ENCOUNTER — Encounter: Payer: Self-pay | Admitting: Adult Health

## 2017-12-22 DIAGNOSIS — M810 Age-related osteoporosis without current pathological fracture: Secondary | ICD-10-CM | POA: Diagnosis not present

## 2017-12-22 DIAGNOSIS — Z79899 Other long term (current) drug therapy: Secondary | ICD-10-CM | POA: Diagnosis not present

## 2017-12-22 DIAGNOSIS — R410 Disorientation, unspecified: Secondary | ICD-10-CM

## 2017-12-22 DIAGNOSIS — B373 Candidiasis of vulva and vagina: Secondary | ICD-10-CM | POA: Diagnosis not present

## 2017-12-22 DIAGNOSIS — R319 Hematuria, unspecified: Secondary | ICD-10-CM | POA: Diagnosis not present

## 2017-12-22 DIAGNOSIS — G250 Essential tremor: Secondary | ICD-10-CM | POA: Diagnosis not present

## 2017-12-22 DIAGNOSIS — N3281 Overactive bladder: Secondary | ICD-10-CM

## 2017-12-22 DIAGNOSIS — M436 Torticollis: Secondary | ICD-10-CM | POA: Diagnosis not present

## 2017-12-22 DIAGNOSIS — K5909 Other constipation: Secondary | ICD-10-CM | POA: Diagnosis not present

## 2017-12-22 DIAGNOSIS — B3731 Acute candidiasis of vulva and vagina: Secondary | ICD-10-CM

## 2017-12-22 DIAGNOSIS — G301 Alzheimer's disease with late onset: Secondary | ICD-10-CM | POA: Diagnosis not present

## 2017-12-22 DIAGNOSIS — R2681 Unsteadiness on feet: Secondary | ICD-10-CM | POA: Diagnosis not present

## 2017-12-22 DIAGNOSIS — F324 Major depressive disorder, single episode, in partial remission: Secondary | ICD-10-CM | POA: Diagnosis not present

## 2017-12-22 DIAGNOSIS — H1031 Unspecified acute conjunctivitis, right eye: Secondary | ICD-10-CM | POA: Diagnosis not present

## 2017-12-22 DIAGNOSIS — R05 Cough: Secondary | ICD-10-CM | POA: Diagnosis not present

## 2017-12-22 DIAGNOSIS — N390 Urinary tract infection, site not specified: Secondary | ICD-10-CM | POA: Diagnosis not present

## 2017-12-22 DIAGNOSIS — R358 Other polyuria: Secondary | ICD-10-CM | POA: Diagnosis not present

## 2017-12-22 DIAGNOSIS — F0280 Dementia in other diseases classified elsewhere without behavioral disturbance: Secondary | ICD-10-CM

## 2017-12-22 DIAGNOSIS — R300 Dysuria: Secondary | ICD-10-CM | POA: Diagnosis not present

## 2017-12-22 NOTE — Progress Notes (Signed)
Provider:   Cindi Carbon, ANP [place] [phone]  Location: Occupational psychologist of Service:  SNF (31)   PCP: Gayland Curry, DO Patient Care Team: Gayland Curry, DO as PCP - General (Geriatric Medicine) Bjorn Loser, MD as Consulting Physician (Urology)  Extended Emergency Contact Information Primary Emergency Contact: [facility] Address: [address]          [place], [place] [address] Johnnette Litter of [place] Work Phone: [phone] Mobile Phone: [phone] Relation: Daughter Secondary Emergency Contact: [facility] Address: [address]          [place], [place] [address] Johnnette Litter of [place] Mobile Phone: [phone] Relation: Other  Code Status: DNR Goals of Care: Advanced Directive information Advanced Directives 11/21/2017  Does Patient Have a Medical Advance Directive? No  Type of Advance Directive Out of facility DNR (pink MOST or yellow form);[place];Living will  Does patient want to make changes to medical advance directive? No - Patient declined  Copy of [place] in Chart? Yes  Would patient like information on creating a medical advance directive? No - Patient declined  Pre-existing out of facility DNR order (yellow form or pink MOST form) Yellow form placed in chart (order not valid for inpatient use)     Chief Complaint  Patient presents with  . Acute Visit    right eye swollen, low grade temp  . Annual Exam    HPI: Patient is a 84 y.o. female seen today for right eye swelling, confusion, and low grade temp, as well as annual comprehensive examination.  Staff report on Monday 4/22 she developed increased confusion. She was putting her PJs on outside her clothes and put nail polish on her lips. That same day she was started on Abilify for psychosis by Dr.Plovsky. The staff felt that medication may be the cause and they notified the MD and the med was  stopped on 4/24.   The nurse reports right eye swelling and redness with low grade temp, hoarseness, and runny nose x 1 day. Ms. Manz has advanced dementia and is a poor historian but notes that she does not feel well and is hoarse. She denies any eye pain or change in vision but notes that her eye feels irritated.   She is up to date on vaccines. Not a candidate at this point for mammography, colonoscopy, or dexa scan.  Also her daughter reports there are many drug allergies that Ms. Nicklaus reported such as cipro and levaquin which were in fact not true allergies but rather failure to improve symptoms of UTI.  Past Medical History:  Diagnosis Date  . Allergic contact dermatitis   . Anxiety   . Arthritis   . Closed fracture of neck of femur ([place]) 01/25/2016  . Cystitis, interstitial   . Depression   . Femur fracture, right ([place]) 01/26/16  . Fracture of right inferior pubic ramus ([place]) 01/26/16  . Hepatitis    over 50 years ago  . Long-term use of high-risk medication   . Osteopenia   . Tremors of nervous system   . Vertigo    Past Surgical History:  Procedure Laterality Date  . APPENDECTOMY    . DILATATION & CURETTAGE/HYSTEROSCOPY WITH MYOSURE N/A 06/26/2015   Procedure: DILATATION & CURETTAGE/HYSTEROSCOPY WITH MYOSURE;  Surgeon: Arvella Nigh, MD;  Location: [facility];  Service: Gynecology;  Laterality: N/A;  . FRACTURE SURGERY  2015   left hip fracture  . HIP ORIF W/  CAPSULOTOMY Left 10/22/2013   [name]   . RIGHT hip percutaneous pinning of impacted femoral neck fracture  01/26/16   [facility]--Dr. Jaynee Eagles orthopedics  . TONSILLECTOMY      reports that she has quit smoking. She quit after 5.00 years of use. She has never used smokeless tobacco. She reports that she does not drink alcohol or use drugs. Social History   Socioeconomic History  . Marital status: Widowed    Spouse name: Not on file  . Number of children: 2  . Years of education: 61  . Highest education level:  Not on file  Occupational History  . Occupation: stay at home mom  Social Needs  . Financial resource strain: Not on file  . Food insecurity:    Worry: Not on file    Inability: Not on file  . Transportation needs:    Medical: Not on file    Non-medical: Not on file  Tobacco Use  . Smoking status: Former Smoker    Years: 5.00  . Smokeless tobacco: Never Used  Substance and Sexual Activity  . Alcohol use: No  . Drug use: No  . Sexual activity: Never  Lifestyle  . Physical activity:    Days per week: Not on file    Minutes per session: Not on file  . Stress: Not on file  Relationships  . Social connections:    Talks on phone: Not on file    Gets together: Not on file    Attends religious service: Not on file    Active member of club or organization: Not on file    Attends meetings of clubs or organizations: Not on file    Relationship status: Not on file  Other Topics Concern  . Not on file  Social History Narrative   Lives at [place] moved in 04/02/15   Widow   Never smoked   Alcohol none   Exercise walking, leg exercise in bed twice daily    Walks w/ cane/walker   POA, Living Will   Left-handed   No caffeine   Family History  Problem Relation Age of Onset  . Dementia Mother   . Cancer Father        pancreatic  . Hepatitis Brother     Pertinent  Health Maintenance Due  Topic Date Due  . PNA vac Low Risk Adult (2 of 2 - PPSV23) 08/29/2018 (Originally 09/28/2015)  . INFLUENZA VACCINE  03/29/2018  . DEXA SCAN  Completed   Fall Risk  12/07/2017 10/26/2017 09/07/2017 08/24/2017 02/14/2017  Falls in the past year? Yes No Yes Yes Yes  Comment - - - no recent -  Number falls in past yr: 2 or more - 1 2 or more 2 or more  Injury with Fall? - - Yes - Yes  Comment - - - - fractured R leg, hurt knee  Risk Factor Category  - - High Fall Risk High Fall Risk -  Risk for fall due to : - - Medication side effect;Impaired balance/gait;Impaired mobility History of fall(s) -     Depression screen [facility] 2/9 12/07/2017 08/24/2017 02/14/2017 04/29/2015  Decreased Interest 0 3 0 0  Down, Depressed, Hopeless 0 1 0 0  PHQ - 2 Score 0 4 0 0  Altered sleeping 0 2 - -  Tired, decreased energy 0 3 - -  Change in appetite 0 3 - -  Feeling bad or failure about yourself  0 0 - -  Trouble concentrating 0  0 - -  Moving slowly or fidgety/restless 0 0 - -  Suicidal thoughts 0 0 - -  PHQ-9 Score 0 12 - -    Functional Status Survey:    Allergies  Allergen Reactions  . Baclofen Other (See Comments)    Made her extremely confused, and falls  . Ham Other (See Comments)  . Latex Rash  . Other Other (See Comments)    Pt reports was seen by neurologist for tremors and was allergic to all medications prescribed for tremors, but does not know names or reaction. Patient reports being allergic to polyester. Does not know what reaction would be. States she was seen by a doctor who tested her for multiple allergies and she had positive results.  . Doxycycline Other (See Comments)    unknown  . Erythromycin     Noted from Neurologist in [place]  . Hydrocortisone   . Lopid [Gemfibrozil]   . Macrobid [employer] Macro] Other (See Comments)    Doesn't work  . Sulfa Antibiotics Other (See Comments)    unknown    Outpatient Encounter Medications as of 12/22/2017  Medication Sig  . cephALEXin (KEFLEX) 250 MG capsule Take 250 mg by mouth daily before lunch.  . Cholecalciferol (D3 HIGH POTENCY) 1000 UNITS capsule Take 1,000 Units by mouth daily.   . citalopram (CELEXA) 20 MG tablet Take 20 mg by mouth daily.  . diclofenac sodium (VOLTAREN) 1 % GEL Apply 2 g topically 4 (four) times daily.  . fluocinolone (VANOS) 0.01 % cream Apply 1 application topically 2 (two) times daily as needed (skin rash).  . meloxicam (MOBIC) 7.5 MG tablet Take 7.5 mg by mouth daily.  . memantine (NAMENDA XR) 14 MG CP24 24 hr capsule Take 14 mg by mouth daily.  . Menthol, Topical Analgesic,  (BIOFREEZE EX) Apply topically 2 (two) times daily.  . mirabegron ER (MYRBETRIQ) 50 MG TB24 tablet Take 50 mg by mouth daily.  [name] Glycol-Propyl Glycol (SYSTANE OP) Place 1 drop into both eyes 4 (four) times daily.  . propranolol (INDERAL) 10 MG tablet Take 0.5 tablets (5 mg total) by mouth 3 (three) times daily.  . raloxifene (EVISTA) 60 MG tablet Take 60 mg by mouth daily.  Marland Kitchen senna-docusate (SENOKOT-S) 8.6-50 MG tablet Take 2 tablets by mouth 2 (two) times daily.  Marland Kitchen tiZANidine (ZANAFLEX) 4 MG tablet Take 0.5 tablets (2 mg total) by mouth 2 (two) times daily as needed for muscle spasms.  . vitamin B-12 (CYANOCOBALAMIN) 1000 MCG tablet Take 1,000 mcg by mouth every other day.  . [DISCONTINUED] acetaminophen (TYLENOL) 325 MG tablet Take 650 mg by mouth 4 (four) times daily.   No facility-administered encounter medications on file as of 12/22/2017.     Review of Systems  Constitutional: Positive for appetite change and fatigue. Negative for activity change, chills, diaphoresis, fever and unexpected weight change.  HENT: Positive for congestion, rhinorrhea and sore throat. Negative for ear pain, facial swelling, hearing loss, sinus pressure, sinus pain and trouble swallowing.   Eyes: Positive for discharge and redness. Negative for photophobia, pain, itching and visual disturbance.  Respiratory: Positive for cough. Negative for shortness of breath and wheezing.   Cardiovascular: Negative for chest pain, palpitations and leg swelling.  Gastrointestinal: Negative for abdominal distention, abdominal pain, constipation, diarrhea, nausea and vomiting.  Genitourinary: Negative for difficulty urinating and dysuria.  Musculoskeletal: Positive for arthralgias, gait problem, neck pain and neck stiffness. Negative for back pain, joint swelling and myalgias.  Skin: Negative  for rash and wound.  Neurological: Positive for tremors. Negative for dizziness, seizures, syncope, facial asymmetry, speech  difficulty, weakness, light-headedness, numbness and headaches.  Psychiatric/Behavioral: Positive for behavioral problems and confusion. Negative for agitation, decreased concentration, dysphoric mood, self-injury, sleep disturbance and suicidal ideas. The patient is nervous/anxious. The patient is not hyperactive.        Delusions    Vitals:   12/22/17 1641  BP: 113/69  Pulse: 87  Resp: 17  Temp: 99.1 F (37.3 C)  SpO2: 94%  Weight: 107 lb 6.4 oz (48.7 kg)   Body mass index is 20.29 kg/m. Physical Exam  Constitutional: No distress.  HENT:  Head: Normocephalic and atraumatic.  Right Ear: External ear normal.  Left Ear: External ear normal.  Mouth/Throat: Oropharyngeal exudate present.  Drainage to both nares  Eyes: Pupils are equal, round, and reactive to light. EOM are normal. Right eye exhibits discharge. Left eye exhibits no discharge.  Erythema to the right upper and lower lid, as well as conjunctiva erythema  Neck: No JVD present. No tracheal deviation present. No thyromegaly present.  Cardiovascular: Normal rate and regular rhythm.  No murmur heard. Pulmonary/Chest: Effort normal and breath sounds normal. No respiratory distress. She has no wheezes. She exhibits no mass and no tenderness. Right breast exhibits no inverted nipple, no mass, no nipple discharge, no skin change and no tenderness. Left breast exhibits no inverted nipple, no mass, no nipple discharge, no skin change and no tenderness.  Abdominal: Soft. Bowel sounds are normal. She exhibits no distension. There is no tenderness.  Genitourinary: No vaginal discharge found.  Genitourinary Comments: Erythema to inner labia  Musculoskeletal: She exhibits no edema or tenderness.  Torticollis noted  Lymphadenopathy:    She has no cervical adenopathy.  Neurological: She is alert.  Oriented to self and location but not time  Skin: Skin is warm and dry. She is not diaphoretic.  Psychiatric: She has a normal mood and  affect.    Labs reviewed: Basic Metabolic Panel: Recent Labs    09/25/17 10/17/17 0600  NA 141 140  K 4.2 4.4  BUN 18 19  CREATININE 0.9 1.0   Liver Function Tests: Recent Labs    09/25/17  AST 19  ALT 8  ALKPHOS 61   No results for input(s): LIPASE, AMYLASE in the last 8760 hours. No results for input(s): AMMONIA in the last 8760 hours. CBC: Recent Labs    09/25/17  WBC 7.8  HGB 13.0  HCT 40  PLT 201   Cardiac Enzymes: No results for input(s): CKTOTAL, CKMB, CKMBINDEX, TROPONINI in the last 8760 hours. BNP: Invalid input(s): POCBNP No results found for: HGBA1C Lab Results  Component Value Date   TSH 2.19 03/04/2016   Lab Results  Component Value Date   VITAMINB12 1,901 (H) 08/24/2017   No results found for: FOLATE No results found for: IRON, TIBC, FERRITIN  Imaging and Procedures obtained recently: No results found.  Assessment/Plan  1. Acute delirium The time line does not quite fit for the Abilify to be the cause of the delirium.  She has nasal drainage and conjunctivitis along with the increase in confusion so I question if there is an underlying infection. Will check CXR.  2. Acute bacterial conjunctivitis of right eye Gentamicin 0.2% ophthalmic ointment 0.5 in ribbon to both eyes TID for 7 days  3. Vaginal candidiasis Diflucan 150 mg x 1 dose  4. Chronic constipation Continue senokot s 2 tabs bid  5. Benign essential tremor  Controlled with propranolol  6. Late onset Alzheimer's disease without behavioral disturbance Worsening in delusions which precipitated the need for Dr. Casimiro Needle to evaluate her.  I feel we may be able to re challenge her with Abilify once this acute illness resolves. Continue Namenda at the current dose at this time.   7. Senile osteoporosis T score -5.4 in July of 2018 Currently on Evista.  While she is at risk for fractures she is minimally ambulatory and would likely not benefit from medication changes. Continue  current medication and monitor. Given her advancing dementia with behaviors I do not think we will send her out for another dexa scan when it is due   8. Torticollis Denied pain for my visit. She is going out for botox injections and receives prn zanaflex from the nsg staff at [facility] for this issue. Tylenol was discontinued because the resident stated it did not help. She has many physical complaints that are difficult to sort out due to her dementia.   9. Overactive bladder Controlled, continue myrbetriq.  10. Recurrent UTI UA clear  Continue Keflex qd for UTI prevention.   11. Unsteady gait Due to progressive dementia and tremor, also has a hx of neuropathy Skilled care Assistance with transfers to the [facility].   12. Major depressive disorder with single episode, in partial remission ([place]) Does not appear depressed Continue celexa, would not taper due to delusions  F/U with Plovsky  Family/ staff Communication: discussed with her daughter Eustaquio Maize  Labs/tests ordered: CXR

## 2017-12-23 ENCOUNTER — Encounter: Payer: Self-pay | Admitting: Adult Health

## 2018-01-18 ENCOUNTER — Encounter: Payer: Medicare Other | Attending: Physical Medicine & Rehabilitation | Admitting: Physical Medicine & Rehabilitation

## 2018-01-18 ENCOUNTER — Encounter: Payer: Self-pay | Admitting: Physical Medicine & Rehabilitation

## 2018-01-18 VITALS — BP 152/90 | HR 68 | Ht 61.0 in | Wt 105.0 lb

## 2018-01-18 DIAGNOSIS — Z87891 Personal history of nicotine dependence: Secondary | ICD-10-CM | POA: Diagnosis not present

## 2018-01-18 DIAGNOSIS — F0280 Dementia in other diseases classified elsewhere without behavioral disturbance: Secondary | ICD-10-CM | POA: Insufficient documentation

## 2018-01-18 DIAGNOSIS — M791 Myalgia, unspecified site: Secondary | ICD-10-CM | POA: Diagnosis not present

## 2018-01-18 DIAGNOSIS — M8580 Other specified disorders of bone density and structure, unspecified site: Secondary | ICD-10-CM | POA: Insufficient documentation

## 2018-01-18 DIAGNOSIS — G243 Spasmodic torticollis: Secondary | ICD-10-CM | POA: Diagnosis not present

## 2018-01-18 DIAGNOSIS — F329 Major depressive disorder, single episode, unspecified: Secondary | ICD-10-CM | POA: Insufficient documentation

## 2018-01-18 DIAGNOSIS — M542 Cervicalgia: Secondary | ICD-10-CM | POA: Insufficient documentation

## 2018-01-18 DIAGNOSIS — G479 Sleep disorder, unspecified: Secondary | ICD-10-CM | POA: Insufficient documentation

## 2018-01-18 DIAGNOSIS — G309 Alzheimer's disease, unspecified: Secondary | ICD-10-CM | POA: Insufficient documentation

## 2018-01-18 DIAGNOSIS — M4722 Other spondylosis with radiculopathy, cervical region: Secondary | ICD-10-CM | POA: Diagnosis not present

## 2018-01-18 DIAGNOSIS — M531 Cervicobrachial syndrome: Secondary | ICD-10-CM

## 2018-01-18 DIAGNOSIS — G8929 Other chronic pain: Secondary | ICD-10-CM | POA: Diagnosis not present

## 2018-01-18 DIAGNOSIS — R269 Unspecified abnormalities of gait and mobility: Secondary | ICD-10-CM | POA: Diagnosis not present

## 2018-01-18 DIAGNOSIS — R251 Tremor, unspecified: Secondary | ICD-10-CM

## 2018-01-18 NOTE — Progress Notes (Addendum)
Subjective:    Patient ID: Melanie Freeman, female    DOB: [birth year], 84 y.o.   MRN: [id number]  HPI 84 y/o female with pmh of dementia, cervical spondylosis with radiculopathy, alzheimer, depression/anxiety, OA presents for follow up for neck pain.   Initially stated: Daughter present, who provides some of history.  Started ~2017.  Denies inciting event.  Getting worse. Denies alleviating factors.  ROM exacerbates symptoms.  Non-radiating.  Constant for the most part.  Heat helps temporarily. PT/OT with no benefit.  Associated headaches and tingling.  Several falls due to balance.  She recently started using wheelchair.  Pain makes life uncomfortable.  Currently resides in ALF.    Last clinic visit 12/07/17.  Pt poor historian. Daughter supplements history. At that time, pt had Botox injection. Pt is unable to identify is she had benefits from the injection.    Pain Inventory Average Pain did not answer.  Pain is in neck trapezius area, headaches, unable to turn head Pain Right Now not answered My pain is constant  In the last 24 hours, has pain interfered with the following? General activity 8 Relation with others 8 Enjoyment of life 8 What TIME of day is your pain at its worst? morning Sleep (in general) Fair  Pain is worse with: walking, bending, standing and trying to turn head Pain improves with: heat/ice and biofreeze Relief from Meds: 5  Mobility use a walker ability to climb steps?  no do you drive?  no use a wheelchair needs help with transfers  Function retired I need assistance with the following:  dressing, bathing, meal prep, household duties and shopping  Neuro/Psych tremor trouble walking confusion depression anxiety  Prior Studies Any changes since last visit?  no  Physicians involved in your care Any changes since last visit?  no Primary care Dr Mariea Clonts Neurologist Dr Jaynee Eagles Orthopedist Dr Laurance Flatten   Family History  Problem Relation Age of Onset    . Dementia Mother   . Cancer Father        pancreatic  . Hepatitis Brother    Social History   Socioeconomic History  . Marital status: Widowed    Spouse name: Not on file  . Number of children: 2  . Years of education: 48  . Highest education level: Not on file  Occupational History  . Occupation: stay at home mom  Social Needs  . Financial resource strain: Not on file  . Food insecurity:    Worry: Not on file    Inability: Not on file  . Transportation needs:    Medical: Not on file    Non-medical: Not on file  Tobacco Use  . Smoking status: Former Smoker    Years: 5.00  . Smokeless tobacco: Never Used  Substance and Sexual Activity  . Alcohol use: No  . Drug use: No  . Sexual activity: Never  Lifestyle  . Physical activity:    Days per week: Not on file    Minutes per session: Not on file  . Stress: Not on file  Relationships  . Social connections:    Talks on phone: Not on file    Gets together: Not on file    Attends religious service: Not on file    Active member of club or organization: Not on file    Attends meetings of clubs or organizations: Not on file    Relationship status: Not on file  Other Topics Concern  . Not on file  Social  History Narrative   Lives at [place] moved in 04/02/15   Widow   Never smoked   Alcohol none   Exercise walking, leg exercise in bed twice daily    Walks w/ cane/walker   POA, Living Will   Left-handed   No caffeine   Past Surgical History:  Procedure Laterality Date  . APPENDECTOMY    . DILATATION & CURETTAGE/HYSTEROSCOPY WITH MYOSURE N/A 06/26/2015   Procedure: DILATATION & CURETTAGE/HYSTEROSCOPY WITH MYOSURE;  Surgeon: Arvella Nigh, MD;  Location: [facility];  Service: Gynecology;  Laterality: N/A;  . FRACTURE SURGERY  2015   left hip fracture  . HIP ORIF W/ CAPSULOTOMY Left 10/22/2013   [name]   . RIGHT hip percutaneous pinning of impacted femoral neck fracture  01/26/16   [facility]--Dr. Jaynee Eagles  orthopedics  . TONSILLECTOMY     Past Medical History:  Diagnosis Date  . Allergic contact dermatitis   . Anxiety   . Arthritis   . Closed fracture of neck of femur ([place]) 01/25/2016  . Cystitis, interstitial   . Depression   . Femur fracture, right ([place]) 01/26/16  . Fracture of right inferior pubic ramus ([place]) 01/26/16  . Hepatitis    over 50 years ago  . Long-term use of high-risk medication   . Osteopenia   . Tremors of nervous system   . Vertigo    BP (!) 152/90   Pulse 68   Ht 5\' 1"  (1.549 m)   Wt 105 lb (47.6 kg)   SpO2 96%   BMI 19.84 kg/m   Opioid Risk Score:   Fall Risk Score:  `1  Depression screen PHQ 2/9  Depression screen [facility] 2/9 12/07/2017 08/24/2017 02/14/2017 04/29/2015  Decreased Interest 0 3 0 0  Down, Depressed, Hopeless 0 1 0 0  PHQ - 2 Score 0 4 0 0  Altered sleeping 0 2 - -  Tired, decreased energy 0 3 - -  Change in appetite 0 3 - -  Feeling bad or failure about yourself  0 0 - -  Trouble concentrating 0 0 - -  Moving slowly or fidgety/restless 0 0 - -  Suicidal thoughts 0 0 - -  PHQ-9 Score 0 12 - -    Review of Systems  Constitutional: Negative.        Poor appetite  HENT: Negative.   Eyes: Negative.   Respiratory: Negative.   Cardiovascular: Negative.   Gastrointestinal: Negative.   Endocrine: Negative.   Genitourinary: Negative.   Musculoskeletal: Positive for arthralgias, gait problem, joint swelling, myalgias, neck pain and neck stiffness.  Skin: Positive for rash.  Allergic/Immunologic: Negative.   Neurological: Positive for tremors.  Hematological: Negative.   Psychiatric/Behavioral: Positive for confusion and dysphoric mood. The patient is nervous/anxious.   All other systems reviewed and are negative.     Objective:   Physical Exam Gen: NAD. Vital signs reviewed HENT: Normocephalic, Atraumatic Eyes: EOMI. No discharge.  Cardio: RRR. No JVD. Pulm: B/l clear to auscultation.  Effort normal Abd: Nondistended, BS+ MSK:       HOH  No TTP.    No edema.   Limitted ROM in neck, significantly improved Neuro: Alert  Head/hand tremor improved Skin: Warm and Dry. Intact    Assessment & Plan:  84 y/o female with pmh of dementia, cervical spondylosis with radiculopathy, alzheimer, depression/anxiety, OA presents for follow up for neck pain.  1. Chronic neck pain  Multifactorial due to spinal stensosis/DDD, and spasmodic torticollis - predominantly laterocollis  MRI from 09/13/2016 reviewed, showing C2-3 disc protrusion with spinal cord Impingement and disc degeneration C3-6.  Cont Heat   Baclofen causes change in mental status  Vit D WNL  Cont Voltaren gel  Cont Tizanidine 2mg  BID PRN  Will consider Gabapentin  Will consider TENS  Will consider retrial with PT  Last visit  right SCM injected 60U    right Trapezius 40U   Will cont current injection- difficult to determine maximal benefit as pt cannot recall previous ROM or benefits, but per discussion and daughter and patient, appear to be improvements in ROM and pain    2. Gait abnormality  Cont wheelchair for safety  Will consider retrial PT  Vitamin B12 WNL  3. Sleep disturbance  See #1  Improving  4. Myalgia   Will consider tigger point injections, however, I do not believe this is predominant etiology of pain, see #1  5. Tremors  In head and hands  Improvement with Propranolol 5 TID, will not increase due to resting heart rate  >25 minutes spent with patient and daughter discussing injection, benefits, and repeat injection

## 2018-01-19 ENCOUNTER — Non-Acute Institutional Stay (SKILLED_NURSING_FACILITY): Payer: Medicare Other | Admitting: Adult Health

## 2018-01-19 DIAGNOSIS — J189 Pneumonia, unspecified organism: Secondary | ICD-10-CM | POA: Diagnosis not present

## 2018-01-19 DIAGNOSIS — M810 Age-related osteoporosis without current pathological fracture: Secondary | ICD-10-CM | POA: Diagnosis not present

## 2018-01-19 DIAGNOSIS — G250 Essential tremor: Secondary | ICD-10-CM

## 2018-01-19 DIAGNOSIS — G8929 Other chronic pain: Secondary | ICD-10-CM

## 2018-01-19 DIAGNOSIS — K5909 Other constipation: Secondary | ICD-10-CM

## 2018-01-19 DIAGNOSIS — G301 Alzheimer's disease with late onset: Secondary | ICD-10-CM

## 2018-01-19 DIAGNOSIS — M542 Cervicalgia: Secondary | ICD-10-CM | POA: Diagnosis not present

## 2018-01-19 DIAGNOSIS — N390 Urinary tract infection, site not specified: Secondary | ICD-10-CM

## 2018-01-19 DIAGNOSIS — F0280 Dementia in other diseases classified elsewhere without behavioral disturbance: Secondary | ICD-10-CM | POA: Diagnosis not present
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
## 2018-01-25 ENCOUNTER — Encounter: Payer: Self-pay | Admitting: Adult Health

## 2018-01-25 NOTE — Assessment & Plan Note (Signed)
Progressive decline in cognition and function c/w the dz. Abilify restarted with no s/e. We need to give it more time to see if it will help with the delusions.

## 2018-01-25 NOTE — Assessment & Plan Note (Signed)
No new symptoms. Continue Keflex daily.

## 2018-01-25 NOTE — Assessment & Plan Note (Signed)
Controlled, continue senokot s 2 tabs BID

## 2018-01-25 NOTE — Assessment & Plan Note (Signed)
Followed by PMR with Botox injections. Also takes celebrex and tylenol for pain. It seems that her pain is well controlled but due to her dementia this is difficult to assess.

## 2018-01-25 NOTE — Assessment & Plan Note (Signed)
Continue propranolol 5 mg TID

## 2018-01-25 NOTE — Assessment & Plan Note (Signed)
BMD due in 2020 but will likely not benefit from going out for additional testing given her minimal weight bearing status. Continue Evista 60 mg qd.

## 2018-02-06 DIAGNOSIS — C44329 Squamous cell carcinoma of skin of other parts of face: Secondary | ICD-10-CM | POA: Diagnosis not present

## 2018-02-08 DIAGNOSIS — R05 Cough: Secondary | ICD-10-CM | POA: Diagnosis not present

## 2018-02-13 ENCOUNTER — Non-Acute Institutional Stay (SKILLED_NURSING_FACILITY): Payer: Medicare Other | Admitting: Internal Medicine

## 2018-02-13 ENCOUNTER — Encounter: Payer: Self-pay | Admitting: Internal Medicine

## 2018-02-13 DIAGNOSIS — F324 Major depressive disorder, single episode, in partial remission: Secondary | ICD-10-CM | POA: Diagnosis not present

## 2018-02-13 DIAGNOSIS — C44329 Squamous cell carcinoma of skin of other parts of face: Secondary | ICD-10-CM | POA: Diagnosis not present

## 2018-02-13 DIAGNOSIS — M1711 Unilateral primary osteoarthritis, right knee: Secondary | ICD-10-CM

## 2018-02-13 DIAGNOSIS — M810 Age-related osteoporosis without current pathological fracture: Secondary | ICD-10-CM

## 2018-02-13 DIAGNOSIS — L814 Other melanin hyperpigmentation: Secondary | ICD-10-CM | POA: Diagnosis not present

## 2018-02-13 DIAGNOSIS — G301 Alzheimer's disease with late onset: Secondary | ICD-10-CM | POA: Diagnosis not present

## 2018-02-13 DIAGNOSIS — F0280 Dementia in other diseases classified elsewhere without behavioral disturbance: Secondary | ICD-10-CM | POA: Diagnosis not present

## 2018-02-13 DIAGNOSIS — K5909 Other constipation: Secondary | ICD-10-CM | POA: Diagnosis not present

## 2018-02-13 DIAGNOSIS — L239 Allergic contact dermatitis, unspecified cause: Secondary | ICD-10-CM | POA: Diagnosis not present

## 2018-02-13 DIAGNOSIS — N3281 Overactive bladder: Secondary | ICD-10-CM | POA: Diagnosis not present

## 2018-02-13 NOTE — Progress Notes (Signed)
Patient ID: Melanie Freeman, female   DOB: 1928/09/22, 84 y.o.   MRN: [id number]  Location:  [facility] Room Number: 116 Place of Service:  SNF ([phone]) Provider:  Gayland Curry, DO  Patient Care Team: Gayland Curry, DO as PCP - General (Geriatric Medicine) Bjorn Loser, MD as Consulting Physician (Urology)  Extended Emergency Contact Information Primary Emergency Contact: [facility] Address: [address]          [place], [place] [address] Johnnette Litter of [place] Work Phone: [phone] Mobile Phone: [phone] Relation: Daughter Secondary Emergency Contact: [facility] Address: [address]          [place], [place] [address] [place] of [place] Mobile Phone: [phone] Relation: Other  Code Status:  DNR Goals of care: Advanced Directive information Advanced Directives 11/21/2017  Does Patient Have a Medical Advance Directive? No  Type of Advance Directive Out of facility DNR (pink MOST or yellow form);[place];Living will  Does patient want to make changes to medical advance directive? No - Patient declined  Copy of [place] in Chart? Yes  Would patient like information on creating a medical advance directive? No - Patient declined  Pre-existing out of facility DNR order (yellow form or pink MOST form) Yellow form placed in chart (order not valid for inpatient use)     Chief Complaint  Patient presents with  . Medical Management of Chronic Issues    routine visit    HPI:  Pt is a 84 y.o. female seen today for medical management of chronic diseases.    Nursing reports indicated right knee pain which is chronic from OA--she gets biofreeze and voltaren gel for these, tylenol.    She went to the [facility] center and had a biopsy and then further surgery on her right temple.  She also saw on site derm and triamcinolone cream was ordered for her back rash which comes and  goes.  Spirits stable.  Has good and bad days.  Remains delusional at times related to her dementia. Is on abilify for the delusions through Dr. Casimiro Needle.  She's also on celexa.    She moves her bowels when she's adherence with the bowel regimen, but often refuses it, then c/o constipation.    Remains on evista and vitamin D for her osteoporosis.  mostly uses a wheelchair to get around   Past Medical History:  Diagnosis Date  . Allergic contact dermatitis   . Anxiety   . Arthritis   . Closed fracture of neck of femur ([place]) 01/25/2016  . Cystitis, interstitial   . Depression   . Femur fracture, right ([place]) 01/26/16  . Fracture of right inferior pubic ramus ([place]) 01/26/16  . Hepatitis    over 50 years ago  . Long-term use of high-risk medication   . Osteopenia   . Tremors of nervous system   . Vertigo    Past Surgical History:  Procedure Laterality Date  . APPENDECTOMY    . DILATATION & CURETTAGE/HYSTEROSCOPY WITH MYOSURE N/A 06/26/2015   Procedure: DILATATION & CURETTAGE/HYSTEROSCOPY WITH MYOSURE;  Surgeon: Arvella Nigh, MD;  Location: [facility];  Service: Gynecology;  Laterality: N/A;  . FRACTURE SURGERY  2015   left hip fracture  . HIP ORIF W/ CAPSULOTOMY Left 10/22/2013   [name]   . RIGHT hip percutaneous pinning of impacted femoral neck fracture  01/26/16   [facility]--Dr. Jaynee Eagles orthopedics  . TONSILLECTOMY      Allergies  Allergen Reactions  . Baclofen Other (See Comments)    Made her extremely confused, and falls  . Ham Other (See Comments)  . Latex Rash  . Other Other (See Comments)    Pt reports was seen by neurologist for tremors and was allergic to all medications prescribed for tremors, but does not know names or reaction. Patient reports being allergic to polyester. Does not know what reaction would be. States she was seen by a doctor who tested her for multiple allergies and she had positive results.  . Doxycycline Other (See Comments)    unknown  .  Erythromycin     Noted from Neurologist in [place]  . Hydrocortisone   . Lopid [Gemfibrozil]   . Macrobid [employer] Macro] Other (See Comments)    Doesn't work  . Sulfa Antibiotics Other (See Comments)    unknown    Outpatient Encounter Medications as of 02/13/2018  Medication Sig  . acetaminophen (TYLENOL) 325 MG tablet Take 650 mg by mouth 2 (two) times daily.  . ARIPiprazole (ABILIFY) 2 MG tablet Take 2 mg by mouth daily.  . cephALEXin (KEFLEX) 250 MG capsule Take 250 mg by mouth daily before lunch.  . Cholecalciferol (D3 HIGH POTENCY) 1000 UNITS capsule Take 1,000 Units by mouth daily.   . citalopram (CELEXA) 20 MG tablet Take 20 mg by mouth daily.  . diclofenac sodium (VOLTAREN) 1 % GEL Apply 2 g topically 4 (four) times daily.  . fluocinolone (VANOS) 0.01 % cream Apply 1 application topically 2 (two) times daily as needed (skin rash).  . meloxicam (MOBIC) 7.5 MG tablet Take 7.5 mg by mouth daily.  . memantine (NAMENDA XR) 14 MG CP24 24 hr capsule Take 14 mg by mouth daily.  . Menthol, Topical Analgesic, (BIOFREEZE EX) Apply topically 2 (two) times daily.  . mirabegron ER (MYRBETRIQ) 50 MG TB24 tablet Take 50 mg by mouth daily.  [name] Glycol-Propyl Glycol (SYSTANE OP) Place 1 drop into both eyes 4 (four) times daily.  . propranolol (INDERAL) 10 MG tablet Take 0.5 tablets (5 mg total) by mouth 3 (three) times daily.  . raloxifene (EVISTA) 60 MG tablet Take 60 mg by mouth daily.  Marland Kitchen senna-docusate (SENOKOT-S) 8.6-50 MG tablet Take 2 tablets by mouth 2 (two) times daily.  Marland Kitchen tiZANidine (ZANAFLEX) 4 MG tablet Take 0.5 tablets (2 mg total) by mouth 2 (two) times daily as needed for muscle spasms.  . vitamin B-12 (CYANOCOBALAMIN) 1000 MCG tablet Take 1,000 mcg by mouth every other day.   No facility-administered encounter medications on file as of 02/13/2018.     Review of Systems  Constitutional: Negative for appetite change, chills and fever.  HENT: Negative for  congestion.   Eyes:       Glasses  Respiratory: Negative for chest tightness and shortness of breath.   Cardiovascular: Negative for chest pain, palpitations and leg swelling.  Gastrointestinal: Positive for constipation. Negative for abdominal pain.  Genitourinary: Positive for frequency and urgency. Negative for dysuria.  Musculoskeletal: Positive for arthralgias, gait problem, myalgias and neck pain.  Skin:       Right temple skin cancer  Neurological: Positive for headaches. Negative for dizziness and weakness.  Hematological: Bruises/bleeds easily.  Psychiatric/Behavioral: Positive for confusion. Negative for agitation and behavioral problems.       Delusions    Immunization History  Administered Date(s) Administered  . Influenza Inj Mdck Quad Pf 06/16/2016  . Influenza-Unspecified 06/18/2015, 06/22/2017  . Pneumococcal Conjugate-13 09/27/2014   Pertinent  Health Maintenance Due  Topic Date Due  . PNA vac Low Risk Adult (2 of 2 - PPSV23) 08/29/2018 (Originally 09/28/2015)  . INFLUENZA VACCINE  03/29/2018  . DEXA SCAN  Completed   Fall Risk  01/18/2018 12/07/2017 10/26/2017 09/07/2017 08/24/2017  Falls in the past year? No Yes No Yes Yes  Comment - - - - no recent  Number falls in past yr: - 2 or more - 1 2 or more  Injury with Fall? - - - Yes -  Comment - - - - -  Risk Factor Category  - - - High Fall Risk High Fall Risk  Risk for fall due to : - - - Medication side effect;Impaired balance/gait;Impaired mobility History of fall(s)   Functional Status Survey:    Vitals:   02/13/18 1420  BP: 126/63  Pulse: 71  Resp: 18  Temp: (!) 97 F (36.1 C)  TempSrc: Oral  SpO2: 97%  Weight: 105 lb (47.6 kg)  Height: 5\' 1"  (1.549 m)   Body mass index is 19.84 kg/m. Physical Exam  Constitutional: No distress.  HENT:  Head: Normocephalic and atraumatic.  Cardiovascular: Normal rate, regular rhythm, normal heart sounds and intact distal pulses.  Pulmonary/Chest: Effort normal  and breath sounds normal. No respiratory distress.  Abdominal: Soft. Bowel sounds are normal. She exhibits no distension and no mass. There is no tenderness. There is no rebound and no guarding.  Musculoskeletal: Normal range of motion.  Tender right knee  Neurological: She is alert.  Skin: Skin is warm and dry.  Rash on upper back; steristrips on right temple  Psychiatric: She has a normal mood and affect.    Labs reviewed: Recent Labs    09/25/17 10/17/17 0600  NA 141 140  K 4.2 4.4  BUN 18 19  CREATININE 0.9 1.0   Recent Labs    09/25/17  AST 19  ALT 8  ALKPHOS 61   Recent Labs    09/25/17  WBC 7.8  HGB 13.0  HCT 40  PLT 201   Lab Results  Component Value Date   TSH 2.19 03/04/2016   No results found for: HGBA1C Lab Results  Component Value Date   CHOL 204 (A) 09/25/2017   HDL 51 09/25/2017   LDLCALC 134 09/25/2017   TRIG 91 09/25/2017    Assessment/Plan 1. Late onset Alzheimer's disease without behavioral disturbance -progressive with delusions -cont SNF level of care -appreciate psychiatric input on abilify for delusions  2. Chronic constipation -cont bowel regimen, does ok if uses medicine  3. Senile osteoporosis -cont evista and vitamin D, exercise as tolerated in activities  4. Localized osteoarthritis of right knee -cont voltaren and biofreeze alternating as needed for pain  5. Overactive bladder -cont myrbetriq and monitor  6. Major depressive disorder with single episode, in partial remission ([place]) -cont celexa therapy, stable   Family/ staff Communication: discussed with snf nurse and reviewed nursing notes  Labs/tests ordered:  No new  Lashell Moffitt L. Audley Hinojos, D.O. [facility] [address], [place] [address] Cell Phone (Mon-Fri 8am-5pm):  [phone] On Call:  [phone] & follow prompts after 5pm & weekends Office Phone:  [phone] Office Fax:  [phone]

## 2018-02-16 ENCOUNTER — Telehealth: Payer: Self-pay | Admitting: Physical Medicine & Rehabilitation

## 2018-02-16 NOTE — Telephone Encounter (Signed)
RN called to say pt is having increased pain in legs & hips. They have been asked to connect MD for any futher orders. NP at the facility has considered [place]?  Please advise...  Thanks, Vilinda Blanks

## 2018-02-18 NOTE — Telephone Encounter (Signed)
I believe this is meant for another MD.  I have not seen or evaluated her for leg/hip pain.  I am seeing her for torticollis and resulting neck pain.  I would need to see her again to evaluate LE if this is desired or she may follow up with PCP. Thanks.

## 2018-02-20 ENCOUNTER — Non-Acute Institutional Stay (SKILLED_NURSING_FACILITY): Payer: Medicare Other

## 2018-02-20 ENCOUNTER — Telehealth: Payer: Self-pay | Admitting: *Deleted

## 2018-02-20 DIAGNOSIS — Z0000 Encounter for general adult medical examination without abnormal findings: Secondary | ICD-10-CM | POA: Diagnosis not present

## 2018-02-20 NOTE — Telephone Encounter (Signed)
Contacted [facility] and relayed Dr. Ena Dawley message.  They will get back to the NP and have her reach out to Primary Care

## 2018-02-20 NOTE — Patient Instructions (Signed)
Melanie Freeman , Thank you for taking time to come for your Medicare Wellness Visit. I appreciate your ongoing commitment to your health goals. Please review the following plan we discussed and let me know if I can assist you in the future.   Screening recommendations/referrals: Colonoscopy excluded, over age 82 Mammogram excluded, over age 8 Bone Density up to date Recommended yearly ophthalmology/optometry visit for glaucoma screening and checkup Recommended yearly dental visit for hygiene and checkup  Vaccinations: Influenza vaccine up to date, due 2019 fall season Pneumococcal vaccine 23 due, ordered Tdap vaccine due, ordere Shingles vaccine not in past records    Advanced directives: in chart  Conditions/risks identified: none  Next appointment: Dr. Mariea Clonts makes rounds   Preventive Care 63 Years and Older, Female Preventive care refers to lifestyle choices and visits with your health care provider that can promote health and wellness. What does preventive care include?  A yearly physical exam. This is also called an annual well check.  Dental exams once or twice a year.  Routine eye exams. Ask your health care provider how often you should have your eyes checked.  Personal lifestyle choices, including:  Daily care of your teeth and gums.  Regular physical activity.  Eating a healthy diet.  Avoiding tobacco and drug use.  Limiting alcohol use.  Practicing safe sex.  Taking low-dose aspirin every day.  Taking vitamin and mineral supplements as recommended by your health care provider. What happens during an annual well check? The services and screenings done by your health care provider during your annual well check will depend on your age, overall health, lifestyle risk factors, and family history of disease. Counseling  Your health care provider may ask you questions about your:  Alcohol use.  Tobacco use.  Drug use.  Emotional well-being.  Home and  relationship well-being.  Sexual activity.  Eating habits.  History of falls.  Memory and ability to understand (cognition).  Work and work Statistician.  Reproductive health. Screening  You may have the following tests or measurements:  Height, weight, and BMI.  Blood pressure.  Lipid and cholesterol levels. These may be checked every 5 years, or more frequently if you are over 61 years old.  Skin check.  Lung cancer screening. You may have this screening every year starting at age 49 if you have a 30-pack-year history of smoking and currently smoke or have quit within the past 15 years.  Fecal occult blood test (FOBT) of the stool. You may have this test every year starting at age 80.  Flexible sigmoidoscopy or colonoscopy. You may have a sigmoidoscopy every 5 years or a colonoscopy every 10 years starting at age 40.  Hepatitis C blood test.  Hepatitis B blood test.  Sexually transmitted disease (STD) testing.  Diabetes screening. This is done by checking your blood sugar (glucose) after you have not eaten for a while (fasting). You may have this done every 1-3 years.  Bone density scan. This is done to screen for osteoporosis. You may have this done starting at age 60.  Mammogram. This may be done every 1-2 years. Talk to your health care provider about how often you should have regular mammograms. Talk with your health care provider about your test results, treatment options, and if necessary, the need for more tests. Vaccines  Your health care provider may recommend certain vaccines, such as:  Influenza vaccine. This is recommended every year.  Tetanus, diphtheria, and acellular pertussis (Tdap, Td) vaccine. You may  need a Td booster every 10 years.  Zoster vaccine. You may need this after age 93.  Pneumococcal 13-valent conjugate (PCV13) vaccine. One dose is recommended after age 18.  Pneumococcal polysaccharide (PPSV23) vaccine. One dose is recommended after  age 59. Talk to your health care provider about which screenings and vaccines you need and how often you need them. This information is not intended to replace advice given to you by your health care provider. Make sure you discuss any questions you have with your health care provider. Document Released: 09/11/2015 Document Revised: 05/04/2016 Document Reviewed: 06/16/2015 Elsevier Interactive Patient Education  2017 [place] Prevention in the Home Falls can cause injuries. They can happen to people of all ages. There are many things you can do to make your home safe and to help prevent falls. What can I do on the outside of my home?  Regularly fix the edges of walkways and driveways and fix any cracks.  Remove anything that might make you trip as you walk through a door, such as a raised step or threshold.  Trim any bushes or trees on the path to your home.  Use bright outdoor lighting.  Clear any walking paths of anything that might make someone trip, such as rocks or tools.  Regularly check to see if handrails are loose or broken. Make sure that both sides of any steps have handrails.  Any raised decks and porches should have guardrails on the edges.  Have any leaves, snow, or ice cleared regularly.  Use sand or salt on walking paths during winter.  Clean up any spills in your garage right away. This includes oil or grease spills. What can I do in the bathroom?  Use night lights.  Install grab bars by the toilet and in the tub and shower. Do not use towel bars as grab bars.  Use non-skid mats or decals in the tub or shower.  If you need to sit down in the shower, use a plastic, non-slip stool.  Keep the floor dry. Clean up any water that spills on the floor as soon as it happens.  Remove soap buildup in the tub or shower regularly.  Attach bath mats securely with double-sided non-slip rug tape.  Do not have throw rugs and other things on the floor that can  make you trip. What can I do in the bedroom?  Use night lights.  Make sure that you have a light by your bed that is easy to reach.  Do not use any sheets or blankets that are too big for your bed. They should not hang down onto the floor.  Have a firm chair that has side arms. You can use this for support while you get dressed.  Do not have throw rugs and other things on the floor that can make you trip. What can I do in the kitchen?  Clean up any spills right away.  Avoid walking on wet floors.  Keep items that you use a lot in easy-to-reach places.  If you need to reach something above you, use a strong step stool that has a grab bar.  Keep electrical cords out of the way.  Do not use floor polish or wax that makes floors slippery. If you must use wax, use non-skid floor wax.  Do not have throw rugs and other things on the floor that can make you trip. What can I do with my stairs?  Do not leave any items on  the stairs.  Make sure that there are handrails on both sides of the stairs and use them. Fix handrails that are broken or loose. Make sure that handrails are as long as the stairways.  Check any carpeting to make sure that it is firmly attached to the stairs. Fix any carpet that is loose or worn.  Avoid having throw rugs at the top or bottom of the stairs. If you do have throw rugs, attach them to the floor with carpet tape.  Make sure that you have a light switch at the top of the stairs and the bottom of the stairs. If you do not have them, ask someone to add them for you. What else can I do to help prevent falls?  Wear shoes that:  Do not have high heels.  Have rubber bottoms.  Are comfortable and fit you well.  Are closed at the toe. Do not wear sandals.  If you use a stepladder:  Make sure that it is fully opened. Do not climb a closed stepladder.  Make sure that both sides of the stepladder are locked into place.  Ask someone to hold it for you,  if possible.  Clearly mark and make sure that you can see:  Any grab bars or handrails.  First and last steps.  Where the edge of each step is.  Use tools that help you move around (mobility aids) if they are needed. These include:  Canes.  Walkers.  Scooters.  Crutches.  Turn on the lights when you go into a dark area. Replace any light bulbs as soon as they burn out.  Set up your furniture so you have a clear path. Avoid moving your furniture around.  If any of your floors are uneven, fix them.  If there are any pets around you, be aware of where they are.  Review your medicines with your doctor. Some medicines can make you feel dizzy. This can increase your chance of falling. Ask your doctor what other things that you can do to help prevent falls. This information is not intended to replace advice given to you by your health care provider. Make sure you discuss any questions you have with your health care provider. Document Released: 06/11/2009 Document Revised: 01/21/2016 Document Reviewed: 09/19/2014 Elsevier Interactive Patient Education  2017 [employer].

## 2018-02-20 NOTE — Telephone Encounter (Signed)
PA request for Myrbetriq. Completed via Cover My Meds and no PA required.

## 2018-02-20 NOTE — Progress Notes (Addendum)
Subjective:   Melanie Freeman is a 84 y.o. female who presents for Medicare Annual (Subsequent) preventive examination at [facility]  Last AWV-02/14/2017    Objective:     Vitals: BP 122/60 (BP Location: Right Arm, Patient Position: Sitting)   Pulse 68   Temp 98.1 F (36.7 C) (Oral)   Ht 5\' 1"  (1.549 m)   Wt 105 lb (47.6 kg)   BMI 19.84 kg/m   Body mass index is 19.84 kg/m.  Advanced Directives 02/20/2018 11/21/2017 10/03/2017 06/27/2017 05/24/2017 02/14/2017 02/01/2017  Does Patient Have a Medical Advance Directive? Yes No Yes Yes Yes Yes Yes  Type of Advance Directive Living will;Out of facility DNR (pink MOST or yellow form) Out of facility DNR (pink MOST or yellow form);[place];Living will [place];Living will;Out of facility DNR (pink MOST or yellow form) Healthcare Power of [place] of facility DNR (pink MOST or yellow form);[place];Living will [place];Living will;Out of facility DNR (pink MOST or yellow form) Out of facility DNR (pink MOST or yellow form);[place];Living will  Does patient want to make changes to medical advance directive? No - Patient declined No - Patient declined No - Patient declined Yes (Inpatient - patient requests chaplain consult to change a medical advance directive) - No - Patient declined -  Copy of [place] in Chart? - Yes Yes No - copy requested Yes Yes Yes  Would patient like information on creating a medical advance directive? - No - Patient declined - No - Patient declined - - -  Pre-existing out of facility DNR order (yellow form or pink MOST form) Yellow form placed in chart (order not valid for inpatient use);Pink MOST form placed in chart (order not valid for inpatient use) Yellow form placed in chart (order not valid for inpatient use) Yellow form placed in chart (order not valid for inpatient use) - Yellow form  placed in chart (order not valid for inpatient use) Yellow form placed in chart (order not valid for inpatient use) Yellow form placed in chart (order not valid for inpatient use)    Tobacco Social History   Tobacco Use  Smoking Status Former Smoker  . Years: 5.00  Smokeless Tobacco Never Used     Counseling given: Not Answered   Clinical Intake:  Pre-visit preparation completed: No  Pain : No/denies pain     Nutritional Risks: None Diabetes: No  How often do you need to have someone help you when you read instructions, pamphlets, or other written materials from your doctor or pharmacy?: 2 - Rarely What is the last grade level you completed in school?: High School  Interpreter Needed?: No  Information entered by :: Tyson Dense, RN  Past Medical History:  Diagnosis Date  . Allergic contact dermatitis   . Anxiety   . Arthritis   . Closed fracture of neck of femur ([place]) 01/25/2016  . Cystitis, interstitial   . Depression   . Femur fracture, right ([place]) 01/26/16  . Fracture of right inferior pubic ramus ([place]) 01/26/16  . Hepatitis    over 50 years ago  . Long-term use of high-risk medication   . Osteopenia   . Tremors of nervous system   . Vertigo    Past Surgical History:  Procedure Laterality Date  . APPENDECTOMY    . DILATATION & CURETTAGE/HYSTEROSCOPY WITH MYOSURE N/A 06/26/2015   Procedure: DILATATION & CURETTAGE/HYSTEROSCOPY WITH MYOSURE;  Surgeon: Arvella Nigh, MD;  Location: [facility];  Service: Gynecology;  Laterality: N/A;  . FRACTURE SURGERY  2015   left hip fracture  . HIP ORIF W/ CAPSULOTOMY Left 10/22/2013   [name]   . RIGHT hip percutaneous pinning of impacted femoral neck fracture  01/26/16   [facility]--Dr. Jaynee Eagles orthopedics  . TONSILLECTOMY     Family History  Problem Relation Age of Onset  . Dementia Mother   . Cancer Father        pancreatic  . Hepatitis Brother    Social History   Socioeconomic History  . Marital status:  Widowed    Spouse name: Not on file  . Number of children: 2  . Years of education: 83  . Highest education level: Not on file  Occupational History  . Occupation: stay at home mom  Social Needs  . Financial resource strain: Not hard at all  . Food insecurity:    Worry: Never true    Inability: Never true  . Transportation needs:    Medical: No    Non-medical: No  Tobacco Use  . Smoking status: Former Smoker    Years: 5.00  . Smokeless tobacco: Never Used  Substance and Sexual Activity  . Alcohol use: No  . Drug use: No  . Sexual activity: Never  Lifestyle  . Physical activity:    Days per week: 0 days    Minutes per session: 0 min  . Stress: Only a little  Relationships  . Social connections:    Talks on phone: Twice a week    Gets together: Twice a week    Attends religious service: Never    Active member of club or organization: No    Attends meetings of clubs or organizations: Never    Relationship status: Widowed  Other Topics Concern  . Not on file  Social History Narrative   Lives at [place] moved in 04/02/15   Widow   Never smoked   Alcohol none   Exercise walking, leg exercise in bed twice daily    Walks w/ cane/walker   POA, Living Will   Left-handed   No caffeine    Outpatient Encounter Medications as of 02/20/2018  Medication Sig  . acetaminophen (TYLENOL) 325 MG tablet Take 650 mg by mouth 2 (two) times daily.  . ARIPiprazole (ABILIFY) 2 MG tablet Take 2 mg by mouth daily.  . cephALEXin (KEFLEX) 250 MG capsule Take 250 mg by mouth daily before lunch.  . Cholecalciferol (D3 HIGH POTENCY) 1000 UNITS capsule Take 1,000 Units by mouth daily.   . citalopram (CELEXA) 20 MG tablet Take 20 mg by mouth daily.  . diclofenac sodium (VOLTAREN) 1 % GEL Apply 2 g topically 4 (four) times daily.  . fluocinolone (VANOS) 0.01 % cream Apply 1 application topically 2 (two) times daily as needed (skin rash).  . meloxicam (MOBIC) 7.5 MG tablet Take 7.5 mg by mouth  daily.  . memantine (NAMENDA XR) 14 MG CP24 24 hr capsule Take 14 mg by mouth daily.  . Menthol, Topical Analgesic, (BIOFREEZE EX) Apply topically 2 (two) times daily.  . mirabegron ER (MYRBETRIQ) 50 MG TB24 tablet Take 50 mg by mouth daily.  [name] Glycol-Propyl Glycol (SYSTANE OP) Place 1 drop into both eyes 4 (four) times daily.  . propranolol (INDERAL) 10 MG tablet Take 0.5 tablets (5 mg total) by mouth 3 (three) times daily.  . raloxifene (EVISTA) 60 MG tablet Take 60 mg by mouth daily.  Marland Kitchen  senna-docusate (SENOKOT-S) 8.6-50 MG tablet Take 2 tablets by mouth 2 (two) times daily.  Marland Kitchen tiZANidine (ZANAFLEX) 4 MG tablet Take 0.5 tablets (2 mg total) by mouth 2 (two) times daily as needed for muscle spasms.  . vitamin B-12 (CYANOCOBALAMIN) 1000 MCG tablet Take 1,000 mcg by mouth every other day.   No facility-administered encounter medications on file as of 02/20/2018.     Activities of Daily Living In your present state of health, do you have any difficulty performing the following activities: 02/20/2018  Hearing? N  Vision? N  Difficulty concentrating or making decisions? Y  Walking or climbing stairs? Y  Dressing or bathing? Y  Doing errands, shopping? Y  Preparing Food and eating ? Y  Using the Toilet? Y  In the past six months, have you accidently leaked urine? N  Do you have problems with loss of bowel control? N  Managing your Medications? Y  Managing your Finances? Y  Housekeeping or managing your Housekeeping? Y  Some recent data might be hidden    Patient Care Team: Gayland Curry, DO as PCP - General (Geriatric Medicine) Bjorn Loser, MD as Consulting Physician (Urology)    Assessment:   This is a routine wellness examination for [place].  Exercise Activities and Dietary recommendations Current Exercise Habits: The patient does not participate in regular exercise at present, Exercise limited by: neurologic condition(s);orthopedic condition(s)  Goals    None       Fall Risk Fall Risk  02/20/2018 01/18/2018 12/07/2017 10/26/2017 09/07/2017  Falls in the past year? Yes No Yes No Yes  Comment - - - - -  Number falls in past yr: 1 - 2 or more - 1  Injury with Fall? No - - - Yes  Comment - - - - -  Risk Factor Category  - - - - High Fall Risk  Risk for fall due to : - - - - Medication side effect;Impaired balance/gait;Impaired mobility   Is the patient's home free of loose throw rugs in walkways, pet beds, electrical cords, etc?   yes      Grab bars in the bathroom? yes      Handrails on the stairs?   yes      Adequate lighting?   yes  Timed Get Up and Go performed: patient is unambulatory  Depression Screen PHQ 2/9 Scores 02/20/2018 12/07/2017 08/24/2017 02/14/2017  PHQ - 2 Score 0 0 4 0  PHQ- 9 Score - 0 12 -     Cognitive Function MMSE - Mini Mental State Exam 08/15/2017 02/14/2017 08/12/2015  Orientation to time 2 2 4   Orientation to Place 3 4 5   Registration 3 3 3   Attention/ Calculation 1 5 5   Recall 0 0 3  Language- name 2 objects 2 2 2   Language- repeat 1 1 1   Language- follow 3 step command 3 3 3   Language- read & follow direction 1 1 1   Write a sentence 0 1 1  Copy design 0 0 1  Total score 16 22 29         Immunization History  Administered Date(s) Administered  . Influenza Inj Mdck Quad Pf 06/16/2016  . Influenza-Unspecified 06/18/2015, 06/22/2017  . Pneumococcal Conjugate-13 09/27/2014    Qualifies for Shingles Vaccine? Not in past records  Screening Tests Health Maintenance  Topic Date Due  . TETANUS/TDAP  08/29/2018 (Originally 07/20/1948)  . PNA vac Low Risk Adult (2 of 2 - PPSV23) 08/29/2018 (Originally 09/28/2015)  . INFLUENZA VACCINE  03/29/2018  . DEXA SCAN  Completed    Cancer Screenings: Lung: Low Dose CT Chest recommended if Age 61-80 years, 30 pack-year currently smoking OR have quit w/in 15years. Patient does not qualify. Breast:  Up to date on Mammogram? Yes   Up to date of Bone Density/Dexa?  Yes Colorectal: up to date  Additional Screenings:  Hepatitis C Screening: declined TDAP due-ordered Pneumovax due-ordered    Plan:    I have personally reviewed and addressed the Medicare Annual Wellness questionnaire and have noted the following in the patient's chart:  A. Medical and social history B. Use of alcohol, tobacco or illicit drugs  C. Current medications and supplements D. Functional ability and status E.  Nutritional status F.  Physical activity G. Advance directives H. List of other physicians I.  Hospitalizations, surgeries, and ER visits in previous 12 months J.  [place] to include hearing, vision, cognitive, depression L. Referrals and appointments - none  In addition, I have reviewed and discussed with patient certain preventive protocols, quality metrics, and best practice recommendations. A written personalized care plan for preventive services as well as general preventive health recommendations were provided to patient.  See attached scanned questionnaire for additional information.   Signed,   Tyson Dense, RN Nurse Health Advisor  Patient Concerns: None

## 2018-02-21 ENCOUNTER — Encounter: Payer: Self-pay | Admitting: Internal Medicine

## 2018-02-21 DIAGNOSIS — H2513 Age-related nuclear cataract, bilateral: Secondary | ICD-10-CM | POA: Diagnosis not present

## 2018-02-21 DIAGNOSIS — H5203 Hypermetropia, bilateral: Secondary | ICD-10-CM | POA: Diagnosis not present

## 2018-03-09 ENCOUNTER — Encounter: Payer: Medicare Other | Attending: Physical Medicine & Rehabilitation | Admitting: Physical Medicine & Rehabilitation

## 2018-03-09 ENCOUNTER — Encounter: Payer: Self-pay | Admitting: Physical Medicine & Rehabilitation

## 2018-03-09 VITALS — BP 137/84 | HR 66 | Ht 61.0 in | Wt 103.0 lb

## 2018-03-09 DIAGNOSIS — R251 Tremor, unspecified: Secondary | ICD-10-CM

## 2018-03-09 DIAGNOSIS — M531 Cervicobrachial syndrome: Secondary | ICD-10-CM | POA: Diagnosis not present

## 2018-03-09 DIAGNOSIS — M791 Myalgia, unspecified site: Secondary | ICD-10-CM | POA: Diagnosis not present

## 2018-03-09 DIAGNOSIS — M542 Cervicalgia: Secondary | ICD-10-CM | POA: Diagnosis not present

## 2018-03-09 DIAGNOSIS — G479 Sleep disorder, unspecified: Secondary | ICD-10-CM | POA: Insufficient documentation

## 2018-03-09 DIAGNOSIS — F0280 Dementia in other diseases classified elsewhere without behavioral disturbance: Secondary | ICD-10-CM | POA: Diagnosis not present

## 2018-03-09 DIAGNOSIS — R269 Unspecified abnormalities of gait and mobility: Secondary | ICD-10-CM | POA: Insufficient documentation

## 2018-03-09 DIAGNOSIS — F329 Major depressive disorder, single episode, unspecified: Secondary | ICD-10-CM | POA: Insufficient documentation

## 2018-03-09 DIAGNOSIS — G8929 Other chronic pain: Secondary | ICD-10-CM | POA: Diagnosis not present

## 2018-03-09 DIAGNOSIS — G243 Spasmodic torticollis: Secondary | ICD-10-CM | POA: Insufficient documentation

## 2018-03-09 DIAGNOSIS — G309 Alzheimer's disease, unspecified: Secondary | ICD-10-CM | POA: Insufficient documentation

## 2018-03-09 DIAGNOSIS — Z87891 Personal history of nicotine dependence: Secondary | ICD-10-CM | POA: Diagnosis not present

## 2018-03-09 DIAGNOSIS — M8580 Other specified disorders of bone density and structure, unspecified site: Secondary | ICD-10-CM | POA: Insufficient documentation

## 2018-03-09 DIAGNOSIS — M4722 Other spondylosis with radiculopathy, cervical region: Secondary | ICD-10-CM | POA: Diagnosis not present

## 2018-03-09 MED ORDER — DICLOFENAC SODIUM 1 % TD GEL: 2.0000 g | Freq: Four times a day (QID) | TRANSDERMAL | 2 refills | Status: AC

## 2018-03-09 NOTE — Progress Notes (Signed)
Botox: Procedure Note Patient Name: Melanie Freeman DOB: 02/03/1929 MRN: [id number]  Date: 03/09/18   Procedure: Botulinum toxin administration Guidance: EMG Diagnosis: Cervical dystonia/Laterocollis Attending: Delice Lesch, MD   Informed consent: Risks, benefits & options of the procedure are explained to the patient (and/or family). The patient elects to proceed with procedure. Risks include but are not limited to weakness, respiratory distress, dry mouth, ptosis, antibody formation, worsening of some areas of function. Benefits include decreased abnormal muscle tone, improved hygiene and positioning, decreased skin breakdown and, in some cases, decreased pain. Options include conservative management with oral antispasticity agents, phenol chemodenervation of nerve or at motor nerve branches. More invasive options include intrathecal balcofen adminstration for appropriate candidates. Surgical options may include tendon lengthening or transposition or, rarely, dorsal rhizotomy.   History/Physical Examination: 84 y.o. with pmh of cervical spondylosis with radiculopathy, alzheimer, depression/anxiety, OA presents with cervical dystonia  Limitted ROM in neck, >>limitation with rotation L>R   Previous Treatments: Range of motion Indication for guidance: Target active muscules  Procedure: Botulinum toxin was mixed with preservative free saline with a dilution of 1cc to 100 units. Targeted limb and muscles were identified. The skin was prepped with alcohol swabs and placement of needle tip in targeted muscle was confirmed using appropriate guidance. Prior to injection, positioning of needle tip outside of blood vessel was determined by pulling back on syringe plunger.  MUSCLE UNITS Right SCM 60 Units  Right upper trapezius 40U  Total units used: 852  Complications: None  Plan: Follow up in 6 weeks  Ankit Lorie Phenix 3:17 PM

## 2018-03-09 NOTE — Progress Notes (Signed)
Subjective:    Patient ID: Melanie Freeman, female    DOB: 10/17/1928, 84 y.o.   MRN: [id number]  HPI 84 y/o female with pmh of dementia, cervical spondylosis with radiculopathy, alzheimer, depression/anxiety, OA presents for follow up for neck pain.   Initially stated: Daughter present, who provides some of history.  Started ~2017.  Denies inciting event.  Getting worse. Denies alleviating factors.  ROM exacerbates symptoms.  Non-radiating.  Constant for the most part.  Heat helps temporarily. PT/OT with no benefit.  Associated headaches and tingling.  Several falls due to balance.  She recently started using wheelchair.  Pain makes life uncomfortable.  Currently resides in ALF.    Last clinic visit 01/18/18.  Pt poor historian. Daughter supplements history. Since last time, received information regarding right hip and knee pain.  Started 12/2015, getting progressively worse. Discussed with daughter today.   Pain Inventory Average Pain did not answer.  Pain is in neck trapezius area, headaches, unable to turn head Pain Right Now 3 My pain is constant and exacerbated with activity  In the last 24 hours, has pain interfered with the following? General activity 8 Relation with others 8 Enjoyment of life 8 What TIME of day is your pain at its worst? morning Sleep (in general) Fair  Pain is worse with: walking, bending, standing and trying to turn head Pain improves with: heat/ice and biofreeze Relief from Meds: 5  Mobility use a walker ability to climb steps?  no do you drive?  no use a wheelchair needs help with transfers  Function retired I need assistance with the following:  dressing, bathing, meal prep, household duties and shopping  Neuro/Psych tremor trouble walking confusion depression anxiety  Prior Studies Any changes since last visit?  no  Physicians involved in your care Any changes since last visit?  no Primary care Dr Mariea Clonts Neurologist Dr  Jaynee Eagles Orthopedist Dr Laurance Flatten   Family History  Problem Relation Age of Onset  . Dementia Mother   . Cancer Father        pancreatic  . Hepatitis Brother    Social History   Socioeconomic History  . Marital status: Widowed    Spouse name: Not on file  . Number of children: 2  . Years of education: 20  . Highest education level: Not on file  Occupational History  . Occupation: stay at home mom  Social Needs  . Financial resource strain: Not hard at all  . Food insecurity:    Worry: Never true    Inability: Never true  . Transportation needs:    Medical: No    Non-medical: No  Tobacco Use  . Smoking status: Former Smoker    Years: 5.00  . Smokeless tobacco: Never Used  Substance and Sexual Activity  . Alcohol use: No  . Drug use: No  . Sexual activity: Never  Lifestyle  . Physical activity:    Days per week: 0 days    Minutes per session: 0 min  . Stress: Only a little  Relationships  . Social connections:    Talks on phone: Twice a week    Gets together: Twice a week    Attends religious service: Never    Active member of club or organization: No    Attends meetings of clubs or organizations: Never    Relationship status: Widowed  Other Topics Concern  . Not on file  Social History Narrative   Lives at [facility] moved in 04/02/15  Widow   Never smoked   Alcohol none   Exercise walking, leg exercise in bed twice daily    Walks w/ cane/walker   POA, Living Will   Left-handed   No caffeine   Past Surgical History:  Procedure Laterality Date  . APPENDECTOMY    . DILATATION & CURETTAGE/HYSTEROSCOPY WITH MYOSURE N/A 06/26/2015   Procedure: DILATATION & CURETTAGE/HYSTEROSCOPY WITH MYOSURE;  Surgeon: Arvella Nigh, MD;  Location: [facility];  Service: Gynecology;  Laterality: N/A;  . FRACTURE SURGERY  2015   left hip fracture  . HIP ORIF W/ CAPSULOTOMY Left 10/22/2013   [name]   . RIGHT hip percutaneous pinning of impacted femoral neck fracture  01/26/16    [facility]--Dr. Jaynee Eagles orthopedics  . TONSILLECTOMY     Past Medical History:  Diagnosis Date  . Allergic contact dermatitis   . Anxiety   . Arthritis   . Closed fracture of neck of femur ([place]) 01/25/2016  . Cystitis, interstitial   . Depression   . Femur fracture, right ([place]) 01/26/16  . Fracture of right inferior pubic ramus ([place]) 01/26/16  . Hepatitis    over 50 years ago  . Long-term use of high-risk medication   . Osteopenia   . Tremors of nervous system   . Vertigo    BP 137/84   Pulse 66   Ht 5\' 1"  (1.549 m)   Wt 103 lb (46.7 kg)   SpO2 94%   BMI 19.46 kg/m   Opioid Risk Score:   Fall Risk Score:  `1  Depression screen PHQ 2/9  Depression screen [facility] 2/9 02/20/2018 12/07/2017 08/24/2017 02/14/2017 04/29/2015  Decreased Interest 0 0 3 0 0  Down, Depressed, Hopeless 0 0 1 0 0  PHQ - 2 Score 0 0 4 0 0  Altered sleeping - 0 2 - -  Tired, decreased energy - 0 3 - -  Change in appetite - 0 3 - -  Feeling bad or failure about yourself  - 0 0 - -  Trouble concentrating - 0 0 - -  Moving slowly or fidgety/restless - 0 0 - -  Suicidal thoughts - 0 0 - -  PHQ-9 Score - 0 12 - -    Review of Systems  Constitutional: Negative.        Poor appetite  HENT: Negative.   Eyes: Negative.   Respiratory: Negative.   Cardiovascular: Negative.   Gastrointestinal: Negative.   Endocrine: Negative.   Genitourinary: Negative.   Musculoskeletal: Positive for arthralgias, gait problem, joint swelling, myalgias, neck pain and neck stiffness.  Skin: Positive for rash.  Allergic/Immunologic: Negative.   Neurological: Positive for tremors.  Hematological: Negative.   Psychiatric/Behavioral: Positive for confusion and dysphoric mood. The patient is nervous/anxious.   All other systems reviewed and are negative.     Objective:   Physical Exam Gen: NAD. Vital signs reviewed HENT: Normocephalic, Atraumatic Eyes: EOMI. No discharge.  Cardio: RRR. No JVD. Pulm: B/l clear to  auscultation.  Effort normal Abd: Nondistended, BS+ MSK:     HOH  No TTP.    No edema.   Limitted ROM in neck L>R  Pain with right hip, knee ROM Neuro: Alert  Head/hand tremor Skin: Warm and Dry. Intact    Assessment & Plan:  84 y/o female with pmh of dementia, cervical spondylosis with radiculopathy, alzheimer, depression/anxiety, OA presents for follow up for neck pain.  1. Chronic neck pain  Multifactorial due to spinal stensosis/DDD, and spasmodic torticollis -  predominantly laterocollis   MRI from 09/13/2016 reviewed, showing C2-3 disc protrusion with spinal cord Impingement and disc degeneration C3-6.  Cont Heat   Baclofen causes change in mental status  Vit D WNL  Cont Voltaren gel  Cont Tizanidine 2mg  BID PRN  Will consider Gabapentin  Will consider TENS  Will consider retrial with PT  Will reinject right SCM injected 60U    right Trapezius 40U   Will cont current injection- difficult to determine maximal benefit as pt cannot recall previous ROM or benefits, but per discussion and daughter and patient, appear to be improvements in ROM and pain    2. Gait abnormality  Cont wheelchair for safety  Will consider retrial PT  Vitamin B12 WNL  3. Sleep disturbance  See #1  Improving  4. Myalgia   Will consider tigger point injections, however, I do not believe this is predominant etiology of pain, see #1  5. Tremors  In head and hands  Improvement with Propranolol 5 TID, will not increase due to resting heart rate  6. Joint pain in right hip and knee  S/p right hip nailing with necrosis in hip per daughter  Will order Voltaren gel

## 2018-03-15 ENCOUNTER — Non-Acute Institutional Stay (SKILLED_NURSING_FACILITY): Payer: Medicare Other | Admitting: Adult Health

## 2018-03-15 DIAGNOSIS — N3281 Overactive bladder: Secondary | ICD-10-CM

## 2018-03-15 DIAGNOSIS — K5909 Other constipation: Secondary | ICD-10-CM

## 2018-03-15 DIAGNOSIS — F0280 Dementia in other diseases classified elsewhere without behavioral disturbance: Secondary | ICD-10-CM | POA: Diagnosis not present

## 2018-03-15 DIAGNOSIS — N390 Urinary tract infection, site not specified: Secondary | ICD-10-CM | POA: Diagnosis not present

## 2018-03-15 DIAGNOSIS — R2681 Unsteadiness on feet: Secondary | ICD-10-CM

## 2018-03-15 DIAGNOSIS — G301 Alzheimer's disease with late onset: Secondary | ICD-10-CM | POA: Diagnosis not present

## 2018-03-15 DIAGNOSIS — G250 Essential tremor: Secondary | ICD-10-CM

## 2018-03-15 DIAGNOSIS — M436 Torticollis: Secondary | ICD-10-CM

## 2018-03-15 NOTE — Progress Notes (Signed)
Location:  Occupational psychologist of Service:  SNF (31) Provider:   Cindi Carbon, ANP [place] [phone]   Gayland Curry, DO  Patient Care Team: Gayland Curry, DO as PCP - General (Geriatric Medicine) Bjorn Loser, MD as Consulting Physician (Urology)  Extended Emergency Contact Information Primary Emergency Contact: [facility] Address: [address]          [place], [place] [address] Johnnette Litter of [place] Work Phone: [phone] Mobile Phone: [phone] Relation: Daughter Secondary Emergency Contact: [facility] Address: [address]          [place], [place] [address] Johnnette Litter of [place] Mobile Phone: [phone] Relation: Other  Code Status:  DNR Goals of care: Advanced Directive information Advanced Directives 02/20/2018  Does Patient Have a Medical Advance Directive? Yes  Type of Advance Directive Living will;Out of facility DNR (pink MOST or yellow form)  Does patient want to make changes to medical advance directive? No - Patient declined  Copy of [place] in Chart? -  Would patient like information on creating a medical advance directive? -  Pre-existing out of facility DNR order (yellow form or pink MOST form) Yellow form placed in chart (order not valid for inpatient use);Pink MOST form placed in chart (order not valid for inpatient use)     Chief Complaint  Patient presents with  . Medical Management of Chronic Issues    HPI:  Pt is a 84 y.o. female seen today for medical management of chronic diseases.   She resides in skilled care due to advancing dementia and functional losses. She is scheduled for surgery 03/29/18 for cataracts.  Recently she visit her PMR doc and was placed on voltaren gel to her right hip and knee due to pain on 03/09/18 but she has not noticed a difference yet. She reports to me her right leg is broken which is not the case (history of fracture in the past  which has now healed). She has periods of paranoia with the staff and complains frequently, telling untrue stories. She continues to report neck pain with tightness. She continues to receive botox injections for this problem. She does not perceive that they have helped during my visit but notes from Dr. Posey Pronto indicate possible improvement in ROM. Her history is difficult to obtain due to her underlying dementia.   Past Medical History:  Diagnosis Date  . Allergic contact dermatitis   . Anxiety   . Arthritis   . Closed fracture of neck of femur ([place]) 01/25/2016  . Cystitis, interstitial   . Depression   . Femur fracture, right ([place]) 01/26/16  . Fracture of right inferior pubic ramus ([place]) 01/26/16  . Hepatitis    over 50 years ago  . Long-term use of high-risk medication   . Osteopenia   . Tremors of nervous system   . Vertigo    Past Surgical History:  Procedure Laterality Date  . APPENDECTOMY    . DILATATION & CURETTAGE/HYSTEROSCOPY WITH MYOSURE N/A 06/26/2015   Procedure: DILATATION & CURETTAGE/HYSTEROSCOPY WITH MYOSURE;  Surgeon: Arvella Nigh, MD;  Location: [facility];  Service: Gynecology;  Laterality: N/A;  . FRACTURE SURGERY  2015   left hip fracture  . HIP ORIF W/ CAPSULOTOMY Left 10/22/2013   [name]   . RIGHT hip percutaneous pinning of impacted femoral neck fracture  01/26/16   [facility]--Dr. Jaynee Eagles orthopedics  . TONSILLECTOMY      Allergies  Allergen Reactions  . Baclofen  Other (See Comments)    Made her extremely confused, and falls  . Latex Rash  . Other Other (See Comments)    Pt reports was seen by neurologist for tremors and was allergic to all medications prescribed for tremors, but does not know names or reaction. Patient reports being allergic to polyester. Does not know what reaction would be. States she was seen by a doctor who tested her for multiple allergies and she had positive results.  . Doxycycline Other (See Comments)    unknown  .  Hydrocortisone   . Lopid [Gemfibrozil]   . Sulfa Antibiotics Other (See Comments)    unknown    Outpatient Encounter Medications as of 03/15/2018  Medication Sig  . acetaminophen (TYLENOL) 325 MG tablet Take 650 mg by mouth 2 (two) times daily.  . ARIPiprazole (ABILIFY) 2 MG tablet Take 2 mg by mouth daily.  . cephALEXin (KEFLEX) 250 MG capsule Take 250 mg by mouth daily before lunch.  . Cholecalciferol (D3 HIGH POTENCY) 1000 UNITS capsule Take 1,000 Units by mouth daily.   . citalopram (CELEXA) 20 MG tablet Take 20 mg by mouth daily.  . diclofenac sodium (VOLTAREN) 1 % GEL Apply 2 g topically 4 (four) times daily. Apply to right knee and hip  . fluocinolone (VANOS) 0.01 % cream Apply 1 application topically 2 (two) times daily as needed (skin rash).  . meloxicam (MOBIC) 7.5 MG tablet Take 7.5 mg by mouth daily.  . memantine (NAMENDA XR) 14 MG CP24 24 hr capsule Take 14 mg by mouth daily.  . Menthol, Topical Analgesic, (BIOFREEZE EX) Apply topically 2 (two) times daily.  . mirabegron ER (MYRBETRIQ) 50 MG TB24 tablet Take 50 mg by mouth daily.  [name] Glycol-Propyl Glycol (SYSTANE OP) Place 1 drop into both eyes 4 (four) times daily.  . propranolol (INDERAL) 10 MG tablet Take 0.5 tablets (5 mg total) by mouth 3 (three) times daily.  . raloxifene (EVISTA) 60 MG tablet Take 60 mg by mouth daily.  Marland Kitchen senna-docusate (SENOKOT-S) 8.6-50 MG tablet Take 2 tablets by mouth 2 (two) times daily.  Marland Kitchen tiZANidine (ZANAFLEX) 4 MG tablet Take 0.5 tablets (2 mg total) by mouth 2 (two) times daily as needed for muscle spasms.  . vitamin B-12 (CYANOCOBALAMIN) 1000 MCG tablet Take 1,000 mcg by mouth every other day.   No facility-administered encounter medications on file as of 03/15/2018.     Review of Systems  Constitutional: Negative for activity change, appetite change, chills, diaphoresis, fatigue, fever and unexpected weight change.  HENT: Negative for congestion.   Respiratory: Negative for cough,  shortness of breath and wheezing.   Cardiovascular: Negative for chest pain, palpitations and leg swelling.  Gastrointestinal: Negative for abdominal distention, abdominal pain, constipation and diarrhea.  Genitourinary: Negative for difficulty urinating and dysuria.  Musculoskeletal: Positive for arthralgias, back pain, gait problem, neck pain and neck stiffness. Negative for joint swelling and myalgias.  Skin: Negative for wound.  Neurological: Negative for dizziness, tremors, seizures, syncope, facial asymmetry, speech difficulty, weakness, light-headedness, numbness and headaches.  Psychiatric/Behavioral: Positive for behavioral problems and confusion. Negative for agitation.    Immunization History  Administered Date(s) Administered  . Influenza Inj Mdck Quad Pf 06/16/2016  . Influenza-Unspecified 06/18/2015, 06/22/2017  . Pneumococcal Conjugate-13 09/27/2014   Pertinent  Health Maintenance Due  Topic Date Due  . PNA vac Low Risk Adult (2 of 2 - PPSV23) 08/29/2018 (Originally 09/28/2015)  . INFLUENZA VACCINE  03/29/2018  . DEXA SCAN  Completed  Fall Risk  02/20/2018 01/18/2018 12/07/2017 10/26/2017 09/07/2017  Falls in the past year? Yes No Yes No Yes  Comment - - - - -  Number falls in past yr: 1 - 2 or more - 1  Injury with Fall? No - - - Yes  Comment - - - - -  Risk Factor Category  - - - - High Fall Risk  Risk for fall due to : - - - - Medication side effect;Impaired balance/gait;Impaired mobility   Functional Status Survey:    Vitals:   03/16/18 0858  BP: 109/62  Pulse: 72  Resp: 18  SpO2: 94%  Weight: 103 lb 3.2 oz (46.8 kg)   Body mass index is 19.5 kg/m.  Wt Readings from Last 3 Encounters:  03/16/18 103 lb 3.2 oz (46.8 kg)  03/09/18 103 lb (46.7 kg)  02/20/18 105 lb (47.6 kg)   Physical Exam  Constitutional: No distress.  HENT:  Head: Normocephalic and atraumatic.  Neck: No JVD present.  Cardiovascular: Normal rate and regular rhythm.  No murmur  heard. Pulmonary/Chest: Effort normal and breath sounds normal. No respiratory distress. She has no wheezes.  Abdominal: Soft. Bowel sounds are normal.  Musculoskeletal: She exhibits no edema.  Decreased ROM to the neck with tight sternocleidomastoid muscle on the right. Pain with ROM to the right knee per patient but no swelling or tenderness or redness.   Neurological: She is alert.  Oriented to self and place but not time  Skin: Skin is warm and dry. She is not diaphoretic.  Psychiatric: She has a normal mood and affect.    Labs reviewed: Recent Labs    09/25/17 10/17/17 0600  NA 141 140  K 4.2 4.4  BUN 18 19  CREATININE 0.9 1.0   Recent Labs    09/25/17  AST 19  ALT 8  ALKPHOS 61   Recent Labs    09/25/17  WBC 7.8  HGB 13.0  HCT 40  PLT 201   Lab Results  Component Value Date   TSH 2.19 03/04/2016   No results found for: HGBA1C Lab Results  Component Value Date   CHOL 204 (A) 09/25/2017   HDL 51 09/25/2017   LDLCALC 134 09/25/2017   TRIG 91 09/25/2017    Significant Diagnostic Results in last 30 days:  No results found.  Assessment/Plan 1. Benign essential tremor Continue inderol 5 mg tid  2. Late onset Alzheimer's disease without behavioral disturbance Progressive decline in cognition and function with associated paranoia but no aggression towards staff Followed by Dr. Martina Sinner, continue abilify which seems to have helped to a degree  3. Torticollis Limited ROM and tight neck muscles noted. Continue heat as needed. Recommend staff utilize the prn zanaflex more often. Followed by PMR. Continue scheduled tylenol and mobic.   4. Overactive bladder Controlled with myrbetriq 50 mg qd  5. Recurrent UTI No new issues Continue Keflex 250 mg qd  6. Unsteady gait With hx of recurrent falls Should use wheelchair and needs assistance with transfers  7. Chronic constipation Controlled Continue senokot s 2 tabs BID   Family/ staff Communication:  resident/staff  Labs/tests ordered:  NA

## 2018-03-16 ENCOUNTER — Encounter: Payer: Self-pay | Admitting: Adult Health

## 2018-03-29 DIAGNOSIS — H25812 Combined forms of age-related cataract, left eye: Secondary | ICD-10-CM | POA: Diagnosis not present

## 2018-03-29 DIAGNOSIS — H2512 Age-related nuclear cataract, left eye: Secondary | ICD-10-CM | POA: Diagnosis not present

## 2018-04-12 DIAGNOSIS — F333 Major depressive disorder, recurrent, severe with psychotic symptoms: Secondary | ICD-10-CM | POA: Diagnosis not present

## 2018-04-13 ENCOUNTER — Non-Acute Institutional Stay (SKILLED_NURSING_FACILITY): Payer: Medicare Other | Admitting: Adult Health

## 2018-04-13 DIAGNOSIS — G301 Alzheimer's disease with late onset: Secondary | ICD-10-CM | POA: Diagnosis not present

## 2018-04-13 DIAGNOSIS — M810 Age-related osteoporosis without current pathological fracture: Secondary | ICD-10-CM

## 2018-04-13 DIAGNOSIS — G8929 Other chronic pain: Secondary | ICD-10-CM

## 2018-04-13 DIAGNOSIS — M25551 Pain in right hip: Secondary | ICD-10-CM

## 2018-04-13 DIAGNOSIS — F0280 Dementia in other diseases classified elsewhere without behavioral disturbance: Secondary | ICD-10-CM

## 2018-04-13 DIAGNOSIS — F324 Major depressive disorder, single episode, in partial remission: Secondary | ICD-10-CM | POA: Diagnosis not present

## 2018-04-13 DIAGNOSIS — M436 Torticollis: Secondary | ICD-10-CM

## 2018-04-13 DIAGNOSIS — R2681 Unsteadiness on feet: Secondary | ICD-10-CM | POA: Diagnosis not present

## 2018-04-13 NOTE — Progress Notes (Signed)
Location:  Occupational psychologist of Service:  SNF (31) Provider:   Cindi Carbon, ANP [place] [phone]   Gayland Curry, DO  Patient Care Team: Gayland Curry, DO as PCP - General (Geriatric Medicine) Bjorn Loser, MD as Consulting Physician (Urology)  Extended Emergency Contact Information Primary Emergency Contact: [facility] Address: [address]          [place], [place] [address] Johnnette Litter of [place] Work Phone: [phone] Mobile Phone: [phone] Relation: Daughter Secondary Emergency Contact: [facility] Address: [address]          [place], [place] [address] Johnnette Litter of [place] Mobile Phone: [phone] Relation: Other  Code Status:  DNR Goals of care: Advanced Directive information Advanced Directives 02/20/2018  Does Patient Have a Medical Advance Directive? Yes  Type of Advance Directive Living will;Out of facility DNR (pink MOST or yellow form)  Does patient want to make changes to medical advance directive? No - Patient declined  Copy of [place] in Chart? -  Would patient like information on creating a medical advance directive? -  Pre-existing out of facility DNR order (yellow form or pink MOST form) Yellow form placed in chart (order not valid for inpatient use);Pink MOST form placed in chart (order not valid for inpatient use)     Chief Complaint  Patient presents with  . Medical Management of Chronic Issues    HPI:  Pt is a 84 y.o. female seen today for medical management of chronic diseases.    Left cataract surgery 10/01/74 with no complications Right eye surgery to be done 05/03/18  Dementia with delusions: she continues to think her right leg is fractured and that it will never be fixed. There is a hx of right femur fractured in 2017 but this has resolved. She reports pain from time to time to the right knee and right hip due to the delusions. She is followed by  Dr Casimiro Needle and on abilify. Improved cognitive testing as below (?) 03/03/18 MMSE 23/30 08/10/17 MMSE 16/30  Torticollis: she is followed by PMR for this and received botox injections. No prn zanaflex has been used. She confirms during my visit that there is chronic pain to her neck and she doesn't feel like the injections help. Per the staff her pain does not seem to limit her in anyway.  OP: currently on Evista with a hx of femur fracture. BMD T score -5.4 03/24/18  Chronic right hip and knee pain: currently uses voltaren gel which seems to help, as well as mobic and tylenol   Functional status: stands and pivots to the chair   Past Medical History:  Diagnosis Date  . Allergic contact dermatitis   . Anxiety   . Arthritis   . Closed fracture of neck of femur ([place]) 01/25/2016  . Cystitis, interstitial   . Depression   . Femur fracture, right ([place]) 01/26/16  . Fracture of right inferior pubic ramus ([place]) 01/26/16  . Hepatitis    over 50 years ago  . Long-term use of high-risk medication   . Osteopenia   . Tremors of nervous system   . Vertigo    Past Surgical History:  Procedure Laterality Date  . APPENDECTOMY    . DILATATION & CURETTAGE/HYSTEROSCOPY WITH MYOSURE N/A 06/26/2015   Procedure: DILATATION & CURETTAGE/HYSTEROSCOPY WITH MYOSURE;  Surgeon: Arvella Nigh, MD;  Location: [facility];  Service: Gynecology;  Laterality: N/A;  . FRACTURE SURGERY  2015   left  hip fracture  . HIP ORIF W/ CAPSULOTOMY Left 10/22/2013   [name]   . RIGHT hip percutaneous pinning of impacted femoral neck fracture  01/26/16   [facility]--Dr. Jaynee Eagles orthopedics  . TONSILLECTOMY      Allergies  Allergen Reactions  . Baclofen Other (See Comments)    Made her extremely confused, and falls  . Latex Rash  . Other Other (See Comments)    Pt reports was seen by neurologist for tremors and was allergic to all medications prescribed for tremors, but does not know names or reaction. Patient reports being  allergic to polyester. Does not know what reaction would be. States she was seen by a doctor who tested her for multiple allergies and she had positive results.  . Doxycycline Other (See Comments)    unknown  . Hydrocortisone   . Lopid [Gemfibrozil]   . Sulfa Antibiotics Other (See Comments)    unknown    Outpatient Encounter Medications as of 04/13/2018  Medication Sig  . acetaminophen (TYLENOL) 325 MG tablet Take 650 mg by mouth 2 (two) times daily.  . ARIPiprazole (ABILIFY) 2 MG tablet Take 2 mg by mouth daily.  . cephALEXin (KEFLEX) 250 MG capsule Take 250 mg by mouth daily before lunch.  . citalopram (CELEXA) 20 MG tablet Take 20 mg by mouth daily.  . diclofenac sodium (VOLTAREN) 1 % GEL Apply 2 g topically 4 (four) times daily. Apply to right knee and hip  . fluocinolone (VANOS) 0.01 % cream Apply 1 application topically 2 (two) times daily as needed (skin rash).  . meloxicam (MOBIC) 7.5 MG tablet Take 7.5 mg by mouth daily.  . memantine (NAMENDA XR) 14 MG CP24 24 hr capsule Take 14 mg by mouth daily.  . Menthol, Topical Analgesic, (BIOFREEZE EX) Apply topically 2 (two) times daily.  . mirabegron ER (MYRBETRIQ) 50 MG TB24 tablet Take 50 mg by mouth daily.  [name] Glycol-Propyl Glycol (SYSTANE OP) Place 1 drop into both eyes 4 (four) times daily.  . propranolol (INDERAL) 10 MG tablet Take 0.5 tablets (5 mg total) by mouth 3 (three) times daily.  . raloxifene (EVISTA) 60 MG tablet Take 60 mg by mouth daily.  Marland Kitchen senna-docusate (SENOKOT-S) 8.6-50 MG tablet Take 2 tablets by mouth 2 (two) times daily.  Marland Kitchen tiZANidine (ZANAFLEX) 4 MG tablet Take 0.5 tablets (2 mg total) by mouth 2 (two) times daily as needed for muscle spasms.  . vitamin B-12 (CYANOCOBALAMIN) 1000 MCG tablet Take 1,000 mcg by mouth every other day.  . Cholecalciferol (D3 HIGH POTENCY) 1000 UNITS capsule Take 1,000 Units by mouth daily.    No facility-administered encounter medications on file as of 04/13/2018.      Review of Systems  Constitutional: Negative for activity change, appetite change, chills, diaphoresis, fatigue, fever and unexpected weight change.  HENT: Negative for congestion.   Respiratory: Negative for cough, shortness of breath and wheezing.   Cardiovascular: Negative for chest pain, palpitations and leg swelling.  Gastrointestinal: Negative for abdominal distention, abdominal pain, constipation and diarrhea.  Genitourinary: Negative for difficulty urinating and dysuria.  Musculoskeletal: Positive for arthralgias, gait problem, neck pain and neck stiffness. Negative for back pain, joint swelling and myalgias.  Skin: Negative for wound.  Neurological: Positive for tremors. Negative for dizziness, seizures, syncope, facial asymmetry, speech difficulty, weakness, light-headedness, numbness and headaches.  Psychiatric/Behavioral: Positive for confusion. Negative for agitation, behavioral problems, dysphoric mood, self-injury, sleep disturbance and suicidal ideas. The patient is nervous/anxious.  Delusions    Immunization History  Administered Date(s) Administered  . Influenza Inj Mdck Quad Pf 06/16/2016  . Influenza-Unspecified 06/18/2015, 06/22/2017  . Pneumococcal Conjugate-13 09/27/2014   Pertinent  Health Maintenance Due  Topic Date Due  . INFLUENZA VACCINE  03/29/2018  . PNA vac Low Risk Adult (2 of 2 - PPSV23) 08/29/2018 (Originally 09/28/2015)  . DEXA SCAN  Completed   Fall Risk  02/20/2018 01/18/2018 12/07/2017 10/26/2017 09/07/2017  Falls in the past year? Yes No Yes No Yes  Comment - - - - -  Number falls in past yr: 1 - 2 or more - 1  Injury with Fall? No - - - Yes  Comment - - - - -  Risk Factor Category  - - - - High Fall Risk  Risk for fall due to : - - - - Medication side effect;Impaired balance/gait;Impaired mobility   Functional Status Survey:    Vitals:   04/17/18 0808  BP: 110/72  Pulse: 71  Weight: 103 lb 12.8 oz (47.1 kg)   Body mass index is  19.61 kg/m. Physical Exam  Constitutional: No distress.  HENT:  Head: Normocephalic and atraumatic.  Mouth/Throat: No oropharyngeal exudate.  Neck: No JVD present.  Cardiovascular: Normal rate and regular rhythm.  No murmur heard. Pulmonary/Chest: Effort normal and breath sounds normal. No respiratory distress. She has no wheezes.  Abdominal: Soft. Bowel sounds are normal.  Musculoskeletal: She exhibits tenderness (right knee with no swelling or bruising). She exhibits no edema or deformity.  Lymphadenopathy:    She has no cervical adenopathy.  Neurological: She is alert.  Oriented x 2, pleasant and able to f/c Tremor noted to hands and head  Skin: Skin is warm and dry. She is not diaphoretic.  Psychiatric:  Reports a fractured leg   Nursing note and vitals reviewed.   Labs reviewed: Recent Labs    09/25/17 10/17/17 0600  NA 141 140  K 4.2 4.4  BUN 18 19  CREATININE 0.9 1.0   Recent Labs    09/25/17  AST 19  ALT 8  ALKPHOS 61   Recent Labs    09/25/17  WBC 7.8  HGB 13.0  HCT 40  PLT 201   Lab Results  Component Value Date   TSH 2.19 03/04/2016   No results found for: HGBA1C Lab Results  Component Value Date   CHOL 204 (A) 09/25/2017   HDL 51 09/25/2017   LDLCALC 134 09/25/2017   TRIG 91 09/25/2017    Significant Diagnostic Results in last 30 days:  No results found.  Assessment/Plan  Dementia Interestingly since she has moved to skilled care her MMSE scores have improved from 16 to 23.  Possibly this was performed on a bad day for her. She continues with delusions but is overall doing well on her current regimen and is followed by Dr. Casimiro Needle. Continue supportive care in the skilled environment.   Senile osteoporosis Continue Evista and Vit D supplementation. She is no longer weight bearing and likely would not benefit from additional testing/intervention.   Torticollis Followed by PMR  Unsteady gait Due to progressive dementia and chronic  joint pain. Needs assistance with all transfers.   Major depressive disorder with single episode, in partial remission ([place]) Followed by Dr. Casimiro Needle, current on Celexa. No s/s of depression during my visit.   Chronic right hip pain As well as chronic right knee pain. Continue scheduled tylenol, mobic, and voltren gel. Check renal function.  Family/ staff Communication: resident/staff  Labs/tests ordered:  BMP

## 2018-04-13 NOTE — Assessment & Plan Note (Signed)
Interestingly since she has moved to skilled care her MMSE scores have improved from 16 to 23.  Possibly this was performed on a bad day for her. She continues with delusions but is overall doing well on her current regimen and is followed by Dr. Casimiro Needle. Continue supportive care in the skilled environment.

## 2018-04-16 DIAGNOSIS — D649 Anemia, unspecified: Secondary | ICD-10-CM | POA: Diagnosis not present

## 2018-04-16 DIAGNOSIS — Z79899 Other long term (current) drug therapy: Secondary | ICD-10-CM | POA: Diagnosis not present

## 2018-04-16 LAB — BASIC METABOLIC PANEL
BUN: 27 — AB (ref 4–21)
Creatinine: 1.3 — AB (ref 0.5–1.1)
Glucose: 122
Potassium: 4.7 (ref 3.4–5.3)
Sodium: 139 (ref 137–147)

## 2018-04-17 ENCOUNTER — Encounter: Payer: Self-pay | Admitting: Adult Health

## 2018-04-17 DIAGNOSIS — M25551 Pain in right hip: Secondary | ICD-10-CM

## 2018-04-17 DIAGNOSIS — G8929 Other chronic pain: Secondary | ICD-10-CM | POA: Insufficient documentation

## 2018-04-17 NOTE — Assessment & Plan Note (Signed)
Followed by PM&R.  

## 2018-04-17 NOTE — Assessment & Plan Note (Signed)
Due to progressive dementia and chronic joint pain. Needs assistance with all transfers.

## 2018-04-17 NOTE — Assessment & Plan Note (Signed)
As well as chronic right knee pain. Continue scheduled tylenol, mobic, and voltren gel. Check renal function.

## 2018-04-17 NOTE — Assessment & Plan Note (Signed)
Continue Evista and Vit D supplementation. She is no longer weight bearing and likely would not benefit from additional testing/intervention.

## 2018-04-17 NOTE — Assessment & Plan Note (Signed)
Followed by Dr. Casimiro Needle, current on Celexa. No s/s of depression during my visit.

## 2018-04-20 ENCOUNTER — Other Ambulatory Visit: Payer: Self-pay

## 2018-04-20 ENCOUNTER — Encounter: Payer: Medicare Other | Attending: Physical Medicine & Rehabilitation | Admitting: Physical Medicine & Rehabilitation

## 2018-04-20 ENCOUNTER — Encounter: Payer: Self-pay | Admitting: Physical Medicine & Rehabilitation

## 2018-04-20 ENCOUNTER — Telehealth: Payer: Self-pay | Admitting: *Deleted

## 2018-04-20 VITALS — BP 122/80 | HR 60 | Ht 61.0 in | Wt 103.0 lb

## 2018-04-20 DIAGNOSIS — F329 Major depressive disorder, single episode, unspecified: Secondary | ICD-10-CM | POA: Insufficient documentation

## 2018-04-20 DIAGNOSIS — M25561 Pain in right knee: Secondary | ICD-10-CM

## 2018-04-20 DIAGNOSIS — F0280 Dementia in other diseases classified elsewhere without behavioral disturbance: Secondary | ICD-10-CM | POA: Insufficient documentation

## 2018-04-20 DIAGNOSIS — G8929 Other chronic pain: Secondary | ICD-10-CM

## 2018-04-20 DIAGNOSIS — G479 Sleep disorder, unspecified: Secondary | ICD-10-CM

## 2018-04-20 DIAGNOSIS — Z87891 Personal history of nicotine dependence: Secondary | ICD-10-CM | POA: Insufficient documentation

## 2018-04-20 DIAGNOSIS — R269 Unspecified abnormalities of gait and mobility: Secondary | ICD-10-CM | POA: Diagnosis not present

## 2018-04-20 DIAGNOSIS — M542 Cervicalgia: Secondary | ICD-10-CM | POA: Insufficient documentation

## 2018-04-20 DIAGNOSIS — R251 Tremor, unspecified: Secondary | ICD-10-CM

## 2018-04-20 DIAGNOSIS — M531 Cervicobrachial syndrome: Secondary | ICD-10-CM | POA: Insufficient documentation

## 2018-04-20 DIAGNOSIS — M4722 Other spondylosis with radiculopathy, cervical region: Secondary | ICD-10-CM | POA: Insufficient documentation

## 2018-04-20 DIAGNOSIS — M8580 Other specified disorders of bone density and structure, unspecified site: Secondary | ICD-10-CM | POA: Insufficient documentation

## 2018-04-20 DIAGNOSIS — M25551 Pain in right hip: Secondary | ICD-10-CM

## 2018-04-20 DIAGNOSIS — G243 Spasmodic torticollis: Secondary | ICD-10-CM

## 2018-04-20 DIAGNOSIS — M791 Myalgia, unspecified site: Secondary | ICD-10-CM | POA: Diagnosis not present

## 2018-04-20 DIAGNOSIS — G309 Alzheimer's disease, unspecified: Secondary | ICD-10-CM | POA: Diagnosis not present

## 2018-04-20 NOTE — Telephone Encounter (Signed)
Misty from [facility] called regarding some need for orders clarification.   Lidocaine Q-12 Will order X-ray Will order PT  Lidocaine Q-12 was explained to her as to place the patch onto the area for 12 then leave it off for 12 hours.  Will have to forward message to Dr. Posey Pronto as Odette Horns and PT referral and order were not placed into the system and they are wanting to know where both are to take place, are they the facility ordering them, or are we.  Please advise.

## 2018-04-20 NOTE — Progress Notes (Addendum)
Subjective:    Patient ID: Melanie Freeman, female    DOB: 01/20/1929, 84 y.o.   MRN: [id number]  HPI 84 y/o female with pmh of dementia, cervical spondylosis with radiculopathy, alzheimer, depression/anxiety, OA presents for follow up for neck pain.   Initially stated: Daughter present, who provides some of history.  Started ~2017.  Denies inciting event.  Getting worse. Denies alleviating factors.  ROM exacerbates symptoms.  Non-radiating.  Constant for the most part.  Heat helps temporarily. PT/OT with no benefit.  Associated headaches and tingling.  Several falls due to balance.  She recently started using wheelchair.  Pain makes life uncomfortable.  Currently resides in ALF.    Last clinic visit 03/09/18.  Patient had Botox injection at that time. Since that time, pt saw her PCP, notes reviewed. Daughter present supplements history.  Patient poor historian.  She notes improvement ROM and pain after injection.  Voltaren gel helping for right knee.  Pain Inventory Average Pain did not answer.  Pain is in neck trapezius area, headaches, unable to turn head Pain Right Now 3 My pain is constant and exacerbated with activity  In the last 24 hours, has pain interfered with the following? General activity 8 Relation with others 8 Enjoyment of life 8 What TIME of day is your pain at its worst? morning Sleep (in general) Fair  Pain is worse with: walking, bending, standing and trying to turn head Pain improves with: heat/ice and biofreeze Relief from Meds: 5  Mobility use a walker ability to climb steps?  no do you drive?  no use a wheelchair needs help with transfers  Function retired I need assistance with the following:  dressing, bathing, meal prep, household duties and shopping  Neuro/Psych tremor trouble walking confusion depression anxiety  Prior Studies Any changes since last visit?  no  Physicians involved in your care Any changes since last visit?  no Primary  care Dr Mariea Clonts Neurologist Dr Jaynee Eagles Orthopedist Dr Laurance Flatten   Family History  Problem Relation Age of Onset  . Dementia Mother   . Cancer Father        pancreatic  . Hepatitis Brother    Social History   Socioeconomic History  . Marital status: Widowed    Spouse name: Not on file  . Number of children: 2  . Years of education: 62  . Highest education level: Not on file  Occupational History  . Occupation: stay at home mom  Social Needs  . Financial resource strain: Not hard at all  . Food insecurity:    Worry: Never true    Inability: Never true  . Transportation needs:    Medical: No    Non-medical: No  Tobacco Use  . Smoking status: Former Smoker    Years: 5.00  . Smokeless tobacco: Never Used  Substance and Sexual Activity  . Alcohol use: No  . Drug use: No  . Sexual activity: Never  Lifestyle  . Physical activity:    Days per week: 0 days    Minutes per session: 0 min  . Stress: Only a little  Relationships  . Social connections:    Talks on phone: Twice a week    Gets together: Twice a week    Attends religious service: Never    Active member of club or organization: No    Attends meetings of clubs or organizations: Never    Relationship status: Widowed  Other Topics Concern  . Not on file  Social  History Narrative   Lives at [facility] moved in 04/02/15   Widow   Never smoked   Alcohol none   Exercise walking, leg exercise in bed twice daily    Walks w/ cane/walker   POA, Living Will   Left-handed   No caffeine   Past Surgical History:  Procedure Laterality Date  . APPENDECTOMY    . DILATATION & CURETTAGE/HYSTEROSCOPY WITH MYOSURE N/A 06/26/2015   Procedure: DILATATION & CURETTAGE/HYSTEROSCOPY WITH MYOSURE;  Surgeon: Arvella Nigh, MD;  Location: [facility];  Service: Gynecology;  Laterality: N/A;  . FRACTURE SURGERY  2015   left hip fracture  . HIP ORIF W/ CAPSULOTOMY Left 10/22/2013   [name]   . RIGHT hip percutaneous pinning of impacted  femoral neck fracture  01/26/16   [facility]--Dr. Jaynee Eagles orthopedics  . TONSILLECTOMY     Past Medical History:  Diagnosis Date  . Allergic contact dermatitis   . Anxiety   . Arthritis   . Closed fracture of neck of femur ([place]) 01/25/2016  . Cystitis, interstitial   . Depression   . Femur fracture, right ([place]) 01/26/16  . Fracture of right inferior pubic ramus ([place]) 01/26/16  . Hepatitis    over 50 years ago  . Long-term use of high-risk medication   . Osteopenia   . Tremors of nervous system   . Vertigo    BP 122/80   Pulse 60   Ht 5\' 1"  (1.549 m)   Wt 103 lb (46.7 kg)   SpO2 95%   BMI 19.46 kg/m   Opioid Risk Score:   Fall Risk Score:  `1  Depression screen PHQ 2/9  Depression screen [facility] 2/9 04/20/2018 02/20/2018 12/07/2017 08/24/2017 02/14/2017 04/29/2015  Decreased Interest 0 0 0 3 0 0  Down, Depressed, Hopeless 0 0 0 1 0 0  PHQ - 2 Score 0 0 0 4 0 0  Altered sleeping - - 0 2 - -  Tired, decreased energy - - 0 3 - -  Change in appetite - - 0 3 - -  Feeling bad or failure about yourself  - - 0 0 - -  Trouble concentrating - - 0 0 - -  Moving slowly or fidgety/restless - - 0 0 - -  Suicidal thoughts - - 0 0 - -  PHQ-9 Score - - 0 12 - -    Review of Systems  Constitutional: Negative.        Poor appetite  HENT: Negative.   Eyes: Negative.   Respiratory: Negative.   Cardiovascular: Negative.   Gastrointestinal: Negative.   Endocrine: Negative.   Genitourinary: Negative.   Musculoskeletal: Positive for arthralgias, gait problem, joint swelling, myalgias, neck pain and neck stiffness.  Skin: Positive for rash.  Allergic/Immunologic: Negative.   Neurological: Positive for tremors.  Hematological: Negative.   Psychiatric/Behavioral: Positive for confusion and dysphoric mood. The patient is nervous/anxious.   All other systems reviewed and are negative.     Objective:   Physical Exam Gen: NAD. Vital signs reviewed HENT: Normocephalic, Atraumatic Eyes: EOMI.  No discharge.  Cardio: RRR. No JVD. Pulm: B/l clear to auscultation.  Effort normal Abd: Nondistended, BS+ MSK:     HOH  No TTP.    No edema.   ROM ~50 deg to right and left  Pain with knee ROM Neuro: Alert  Head/hand tremor Skin: Warm and Dry. Intact    Assessment & Plan:  84 y/o female with pmh of dementia, cervical spondylosis with radiculopathy, alzheimer,  depression/anxiety, OA presents for follow up for neck pain.  1. Chronic neck pain  Multifactorial due to spinal stensosis/DDD, and spasmodic torticollis - predominantly laterocollis   MRI from 09/13/2016 reviewed, showing C2-3 disc protrusion with spinal cord Impingement and disc degeneration C3-6.  Cont Heat   Baclofen causes change in mental status  Vit D WNL  Cont Voltaren gel  Cont Tizanidine 2mg  BID PRN  Will consider Gabapentin if necessary  Will consider TENS if necessary  Will order PT  Will reinject on next visit    right SCM injected 60U    right Trapezius 40U  Will cont current injection- difficult to determine maximal benefit as pt cannot recall previous ROM or benefits, but per discussion and daughter and patient, pt endorses improvement with ROM and pain  Communication sent to SNF  Labs reviewed from SNF   2. Gait abnormality  Cont wheelchair for safety  Will order PT  Vitamin B12 WNL  3. Sleep disturbance  See #1  Improving  4. Myalgia   Will consider tigger point injections, however, I do not believe this is predominant etiology of pain, see #1  5. Tremors  In head and hands  Cont Propranolol 5 TID, will not increase due to resting heart rate  6. Joint pain in right hip and knee  S/p right hip nailing with necrosis in hip per daughter  Cont Voltaren gel to hip and knee  Will order Lidoderm patch for right hip  Will order TENS unit  Will order Xray of knee

## 2018-04-21 NOTE — Telephone Encounter (Signed)
I am not sure what facilities they have, so I did not order any of the items,  However, I would like her to try 5% Lidoderm patch for 12 hours on/off as mentioned.  I would like her to obtain an xray of her knee.  I would like her to start PT for stretching, ROM, of neck and LE.  Thanks.

## 2018-04-23 NOTE — Telephone Encounter (Signed)
Orders need to be placed?

## 2018-04-23 NOTE — Telephone Encounter (Signed)
If the nursing home does these things, then they can be given verbal order, otherwise we can put in a normal order.  Thanks.

## 2018-04-24 NOTE — Telephone Encounter (Signed)
Misty from [facility] called, stated the price for the lidoderm 5% patches is going to cost patient 202 dollars per month to fill, requesting the prescription be changed to [employer] 4% patches due to cost.

## 2018-04-24 NOTE — Telephone Encounter (Signed)
That is fine. Thanks 

## 2018-04-25 NOTE — Telephone Encounter (Signed)
Misty from [employer] notified [employer] 4% patch will be ok.

## 2018-05-01 NOTE — Telephone Encounter (Signed)
Xray may be done at the facility - 4 view right knee xray.  We may place an order for TENS with [facility].  Thank you.

## 2018-05-01 NOTE — Telephone Encounter (Signed)
[facility] is still wanting to know if an x-ray order is going to be placed for patients knee. It can be done with a verbal order and mobile x-ray will come and do it or Dr. Posey Pronto can place an order and they can arrange transportation. In either case, they need to know the specifics, what kind of view, how many views, which side....Marland KitchenMarland KitchenRegarding TENS unit, an ORDER needs to be submitted to [facility] so it can be processed. [facility] provided an address that can be given to the [facility] rep. The device can be delivered and fitted at patients address: [address], [place], [place] [address]. This has been delayed and needs to be dealt with expeditiously

## 2018-05-02 ENCOUNTER — Encounter: Payer: Self-pay | Admitting: *Deleted

## 2018-05-02 NOTE — Telephone Encounter (Signed)
error 

## 2018-05-02 NOTE — Telephone Encounter (Addendum)
Attempted to call the number listed under contact. There is no answer and no name on VM

## 2018-05-03 DIAGNOSIS — H2511 Age-related nuclear cataract, right eye: Secondary | ICD-10-CM | POA: Diagnosis not present

## 2018-05-03 DIAGNOSIS — H25811 Combined forms of age-related cataract, right eye: Secondary | ICD-10-CM | POA: Diagnosis not present

## 2018-05-03 NOTE — Telephone Encounter (Signed)
Verbal order given for x ray to [place].  I gave her heads up that we would be placing the order for the TENS unitl.

## 2018-05-04 DIAGNOSIS — M25561 Pain in right knee: Secondary | ICD-10-CM | POA: Diagnosis not present

## 2018-05-04 NOTE — Telephone Encounter (Signed)
Order sent to [facility] rep

## 2018-05-09 DIAGNOSIS — M4722 Other spondylosis with radiculopathy, cervical region: Secondary | ICD-10-CM | POA: Diagnosis not present

## 2018-05-09 DIAGNOSIS — M25551 Pain in right hip: Secondary | ICD-10-CM | POA: Diagnosis not present

## 2018-05-09 DIAGNOSIS — M1711 Unilateral primary osteoarthritis, right knee: Secondary | ICD-10-CM | POA: Diagnosis not present

## 2018-05-09 DIAGNOSIS — G301 Alzheimer's disease with late onset: Secondary | ICD-10-CM | POA: Diagnosis not present

## 2018-05-09 DIAGNOSIS — R2689 Other abnormalities of gait and mobility: Secondary | ICD-10-CM | POA: Diagnosis not present

## 2018-05-09 DIAGNOSIS — M810 Age-related osteoporosis without current pathological fracture: Secondary | ICD-10-CM | POA: Diagnosis not present

## 2018-05-09 DIAGNOSIS — M25651 Stiffness of right hip, not elsewhere classified: Secondary | ICD-10-CM | POA: Diagnosis not present

## 2018-05-09 DIAGNOSIS — M1611 Unilateral primary osteoarthritis, right hip: Secondary | ICD-10-CM | POA: Diagnosis not present

## 2018-05-09 DIAGNOSIS — M542 Cervicalgia: Secondary | ICD-10-CM | POA: Diagnosis not present

## 2018-05-09 DIAGNOSIS — G8929 Other chronic pain: Secondary | ICD-10-CM | POA: Diagnosis not present

## 2018-05-09 DIAGNOSIS — F0280 Dementia in other diseases classified elsewhere without behavioral disturbance: Secondary | ICD-10-CM | POA: Diagnosis not present

## 2018-05-09 DIAGNOSIS — M25661 Stiffness of right knee, not elsewhere classified: Secondary | ICD-10-CM | POA: Diagnosis not present

## 2018-05-09 DIAGNOSIS — M25561 Pain in right knee: Secondary | ICD-10-CM | POA: Diagnosis not present

## 2018-05-10 DIAGNOSIS — M25561 Pain in right knee: Secondary | ICD-10-CM | POA: Diagnosis not present

## 2018-05-10 DIAGNOSIS — M25651 Stiffness of right hip, not elsewhere classified: Secondary | ICD-10-CM | POA: Diagnosis not present

## 2018-05-10 DIAGNOSIS — M542 Cervicalgia: Secondary | ICD-10-CM | POA: Diagnosis not present

## 2018-05-10 DIAGNOSIS — R2689 Other abnormalities of gait and mobility: Secondary | ICD-10-CM | POA: Diagnosis not present

## 2018-05-10 DIAGNOSIS — M25551 Pain in right hip: Secondary | ICD-10-CM | POA: Diagnosis not present

## 2018-05-10 DIAGNOSIS — M25661 Stiffness of right knee, not elsewhere classified: Secondary | ICD-10-CM | POA: Diagnosis not present

## 2018-05-11 DIAGNOSIS — M25661 Stiffness of right knee, not elsewhere classified: Secondary | ICD-10-CM | POA: Diagnosis not present

## 2018-05-11 DIAGNOSIS — M542 Cervicalgia: Secondary | ICD-10-CM | POA: Diagnosis not present

## 2018-05-11 DIAGNOSIS — R2689 Other abnormalities of gait and mobility: Secondary | ICD-10-CM | POA: Diagnosis not present

## 2018-05-11 DIAGNOSIS — M25651 Stiffness of right hip, not elsewhere classified: Secondary | ICD-10-CM | POA: Diagnosis not present

## 2018-05-11 DIAGNOSIS — M25561 Pain in right knee: Secondary | ICD-10-CM | POA: Diagnosis not present

## 2018-05-11 DIAGNOSIS — M25551 Pain in right hip: Secondary | ICD-10-CM | POA: Diagnosis not present

## 2018-05-14 DIAGNOSIS — M25651 Stiffness of right hip, not elsewhere classified: Secondary | ICD-10-CM | POA: Diagnosis not present

## 2018-05-14 DIAGNOSIS — M542 Cervicalgia: Secondary | ICD-10-CM | POA: Diagnosis not present

## 2018-05-14 DIAGNOSIS — R2689 Other abnormalities of gait and mobility: Secondary | ICD-10-CM | POA: Diagnosis not present

## 2018-05-14 DIAGNOSIS — M25561 Pain in right knee: Secondary | ICD-10-CM | POA: Diagnosis not present

## 2018-05-14 DIAGNOSIS — M25551 Pain in right hip: Secondary | ICD-10-CM | POA: Diagnosis not present

## 2018-05-14 DIAGNOSIS — M25661 Stiffness of right knee, not elsewhere classified: Secondary | ICD-10-CM | POA: Diagnosis not present

## 2018-05-15 ENCOUNTER — Encounter: Payer: Self-pay | Admitting: Internal Medicine

## 2018-05-15 ENCOUNTER — Non-Acute Institutional Stay (SKILLED_NURSING_FACILITY): Payer: Medicare Other | Admitting: Internal Medicine

## 2018-05-15 DIAGNOSIS — K5909 Other constipation: Secondary | ICD-10-CM | POA: Diagnosis not present

## 2018-05-15 DIAGNOSIS — R2689 Other abnormalities of gait and mobility: Secondary | ICD-10-CM | POA: Diagnosis not present

## 2018-05-15 DIAGNOSIS — G8929 Other chronic pain: Secondary | ICD-10-CM

## 2018-05-15 DIAGNOSIS — M810 Age-related osteoporosis without current pathological fracture: Secondary | ICD-10-CM

## 2018-05-15 DIAGNOSIS — F22 Delusional disorders: Secondary | ICD-10-CM

## 2018-05-15 DIAGNOSIS — G301 Alzheimer's disease with late onset: Secondary | ICD-10-CM

## 2018-05-15 DIAGNOSIS — M25661 Stiffness of right knee, not elsewhere classified: Secondary | ICD-10-CM | POA: Diagnosis not present

## 2018-05-15 DIAGNOSIS — F0280 Dementia in other diseases classified elsewhere without behavioral disturbance: Secondary | ICD-10-CM | POA: Diagnosis not present

## 2018-05-15 DIAGNOSIS — M25561 Pain in right knee: Secondary | ICD-10-CM | POA: Diagnosis not present

## 2018-05-15 DIAGNOSIS — M25651 Stiffness of right hip, not elsewhere classified: Secondary | ICD-10-CM | POA: Diagnosis not present

## 2018-05-15 DIAGNOSIS — M25551 Pain in right hip: Secondary | ICD-10-CM | POA: Diagnosis not present

## 2018-05-15 DIAGNOSIS — M4722 Other spondylosis with radiculopathy, cervical region: Secondary | ICD-10-CM

## 2018-05-15 DIAGNOSIS — M542 Cervicalgia: Secondary | ICD-10-CM | POA: Diagnosis not present

## 2018-05-15 NOTE — Progress Notes (Signed)
Patient ID: Melanie Freeman, female   DOB: Oct 16, 1928, 84 y.o.   MRN: [id number]  Location:  [facility] Room Number: 116 Place of Service:  SNF [phone]) Provider:   Gayland Curry, DO  Patient Care Team: Melanie Curry, DO as PCP - General (Geriatric Medicine) Melanie Loser, MD as Consulting Physician (Urology)  Extended Emergency Contact Information Primary Emergency Contact: Melanie Freeman Address: [address]          [place], [place] [address] Melanie Freeman Work Phone: [phone] Mobile Phone: [phone] Relation: Daughter Secondary Emergency Contact: [facility] Address: [address]          [place], [place] [address] Melanie Freeman Mobile Phone: [phone] Relation: Other  Code Status:  DNR Goals of care: Advanced Directive information Advanced Directives 05/15/2018  Does Patient Have a Medical Advance Directive? Yes  Type of Paramedic of [place];Living will;Out of facility DNR (pink MOST or yellow form)  Does patient want to make changes to medical advance directive? No - Patient declined  Copy of [place] in Chart? Yes  Would patient like information on creating a medical advance directive? -  Pre-existing out of facility DNR order (yellow form or pink MOST form) Yellow form placed in chart (order not valid for inpatient use)     Chief Complaint  Patient presents with  . Medical Management of Chronic Issues    Routine Visit    HPI:  Pt is a 84 y.o. female seen today for medical management of chronic diseases.    Melanie Freeman continues to go out to PMR for management of her various arthritic pains.  Most recently, her biggest complaint has been her right knee and leg and she's been getting TENS to her right hip and salonpas with lidocaine, tylenol bid and mobic qod.  Renal function has improved a bit with reduction of the frequency of mobic.    Pt's  dementia with delusions persists.  Her last MMSE score was 23/30 with her sentence being "mom is my best friend".  She often thinks her mother is still alive.  She still occasionally talks about the delusion of someone being in her room and injuring her knee for which staff and administrative investigation revealed no true events.    No difficulty with her bowels recently with current approach.  She is sleeping at night.    Weight is down 1 more lb since last visit.  She's never been a big eater and still worries about her weight despite it being very low.    Past Medical History:  Diagnosis Date  . Allergic contact dermatitis   . Anxiety   . Arthritis   . Closed fracture of neck of femur ([place]) 01/25/2016  . Cystitis, interstitial   . Depression   . Femur fracture, right ([place]) 01/26/16  . Fracture of right inferior pubic ramus ([place]) 01/26/16  . Hepatitis    over 50 years ago  . Long-term use of high-risk medication   . Osteopenia   . Tremors of nervous system   . Vertigo    Past Surgical History:  Procedure Laterality Date  . APPENDECTOMY    . DILATATION & CURETTAGE/HYSTEROSCOPY WITH MYOSURE N/A 06/26/2015   Procedure: DILATATION & CURETTAGE/HYSTEROSCOPY WITH MYOSURE;  Surgeon: Melanie Nigh, MD;  Location: [facility];  Service: Gynecology;  Laterality: N/A;  . FRACTURE SURGERY  2015   left hip fracture  . HIP ORIF W/ CAPSULOTOMY Left 10/22/2013  Melanie Freeman   . RIGHT hip percutaneous pinning of impacted femoral neck fracture  01/26/16   [facility]--Dr. Jaynee Freeman orthopedics  . TONSILLECTOMY      Allergies  Allergen Reactions  . Baclofen Other (See Comments)    Made her extremely confused, and falls  . Latex Rash  . Other Other (See Comments)    Pt reports was seen by neurologist for tremors and was allergic to all medications prescribed for tremors, but does not know names or reaction. Patient reports being allergic to polyester. Does not know what reaction would be. States she  was seen by a doctor who tested her for multiple allergies and she had positive results.  . Doxycycline Other (See Comments)    unknown  . Hydrocortisone   . Lopid [Gemfibrozil]   . Sulfa Antibiotics Other (See Comments)    unknown    Outpatient Encounter Medications as of 05/15/2018  Medication Sig  . acetaminophen (TYLENOL) 325 MG tablet Take 650 mg by mouth 2 (two) times daily.  . ARIPiprazole (ABILIFY) 2 MG tablet Take 2 mg by mouth daily.  . cephALEXin (KEFLEX) 250 MG capsule Take 250 mg by mouth daily before lunch.  . Cholecalciferol (D3 HIGH POTENCY) 1000 UNITS capsule Take 1,000 Units by mouth daily.   . citalopram (CELEXA) 20 MG tablet Take 20 mg by mouth daily.  . diclofenac sodium (VOLTAREN) 1 % GEL Apply 2 g topically 4 (four) times daily. Apply to right knee and hip  . Difluprednate (DUREZOL) 0.05 % EMUL Apply 1 drop to eye 2 (two) times daily.  . fluocinolone (VANOS) 0.01 % cream Apply 1 application topically 2 (two) times daily as needed (skin rash).  . meloxicam (MOBIC) 7.5 MG tablet Take 7.5 mg by mouth daily.  . memantine (NAMENDA XR) 14 MG CP24 24 hr capsule Take 14 mg by mouth daily.  . Menthol, Topical Analgesic, (BIOFREEZE EX) Apply topically 2 (two) times daily.  . mirabegron ER (MYRBETRIQ) 50 MG TB24 tablet Take 50 mg by mouth daily.  Melanie Freeman Glycol-Propyl Glycol (SYSTANE OP) Place 1 drop into both eyes 4 (four) times daily.  . propranolol (INDERAL) 10 MG tablet Take 0.5 tablets (5 mg total) by mouth 3 (three) times daily.  . raloxifene (EVISTA) 60 MG tablet Take 60 mg by mouth daily.  Marland Kitchen senna-docusate (SENOKOT-S) 8.6-50 MG tablet Take 2 tablets by mouth 2 (two) times daily.  Marland Kitchen tiZANidine (ZANAFLEX) 4 MG tablet Take 0.5 tablets (2 mg total) by mouth 2 (two) times daily as needed for muscle spasms.  . vitamin B-12 (CYANOCOBALAMIN) 1000 MCG tablet Take 1,000 mcg by mouth every other day.   No facility-administered encounter medications on file as of 05/15/2018.       Review of Systems  Constitutional: Negative for activity change, appetite change, chills, fatigue and fever.  HENT: Positive for hearing loss.   Eyes: Negative for visual disturbance.       Glasses  Respiratory: Negative for apnea and shortness of breath.   Cardiovascular: Negative for chest pain, palpitations and leg swelling.  Gastrointestinal: Negative for abdominal pain.  Genitourinary: Negative for dysuria.  Musculoskeletal: Positive for arthralgias, gait problem, myalgias and neck pain.  Neurological: Positive for weakness. Negative for dizziness.  Hematological: Does not bruise/bleed easily.  Psychiatric/Behavioral: Positive for confusion and dysphoric mood. The patient is nervous/anxious.     Immunization History  Administered Date(s) Administered  . Influenza Inj Mdck Quad Pf 06/16/2016  . Influenza-Unspecified 06/18/2015, 06/22/2017  . Pneumococcal Conjugate-13  09/27/2014   Pertinent  Health Maintenance Due  Topic Date Due  . INFLUENZA VACCINE  03/29/2018  . PNA vac Low Risk Adult (2 of 2 - PPSV23) 08/29/2018 (Originally 09/28/2015)  . DEXA SCAN  Completed   Fall Risk  04/20/2018 02/20/2018 01/18/2018 12/07/2017 10/26/2017  Falls in the past year? No Yes No Yes No  Comment - - - - -  Number falls in past yr: - 1 - 2 or more -  Injury with Fall? - No - - -  Comment - - - - -  Risk Factor Category  - - - - -  Risk for fall due to : - - - - -   Functional Status Survey:    Vitals:   05/15/18 1314  BP: 125/83  Pulse: 64  Resp: 20  Temp: (!) 97.1 F (36.2 C)  TempSrc: Oral  SpO2: 96%  Weight: 102 lb (46.3 kg)  Height: 5\' 1"  (1.549 m)   Body mass index is 19.27 kg/m. Physical Exam  Constitutional: No distress.  Thin female, uses standard wheelchair for mobility  HENT:  Head: Normocephalic and atraumatic.  Eyes:  glasses  Cardiovascular: Normal rate, regular rhythm, normal heart sounds and intact distal pulses.  Pulmonary/Chest: Effort normal and breath  sounds normal. No respiratory distress.  Musculoskeletal: Normal range of motion.  Neurological: She is alert. No cranial nerve deficit.  Tremor   Skin: Skin is warm and dry. There is pallor.  Psychiatric: She has a normal mood and affect.  Very pleasant and sociable, but talking about her mother like she is still alive today    Labs reviewed: Recent Labs    09/25/17 10/17/17 0600 04/16/18 0900  NA 141 140 139  K 4.2 4.4 4.7  BUN 18 19 27*  CREATININE 0.9 1.0 1.3*   Recent Labs    09/25/17  AST 19  ALT 8  ALKPHOS 61   Recent Labs    09/25/17  WBC 7.8  HGB 13.0  HCT 40  PLT 201   Lab Results  Component Value Date   TSH 2.19 03/04/2016   No results found for: HGBA1C Lab Results  Component Value Date   CHOL 204 (A) 09/25/2017   HDL 51 09/25/2017   LDLCALC 134 09/25/2017   TRIG 91 09/25/2017   Assessment/Plan 1. Late onset Alzheimer's disease without behavioral disturbance ([place]) -continues to gradually progress MMSE - Mini Mental State Exam 08/15/2017 02/14/2017 08/12/2015  Orientation to time 2 2 4   Orientation to Place 3 4 5   Registration 3 3 3   Attention/ Calculation 1 5 5   Recall 0 0 3  Language- name 2 objects 2 2 2   Language- repeat 1 1 1   Language- follow 3 step command 3 3 3   Language- read & follow direction 1 1 1   Write a sentence 0 1 1  Copy design 0 0 1  Total score 16 22 29   has paranoid delusions with this--unclear if some of this was preexisting  2. Chronic right hip pain -cont tens, salonpas, tylenol, only QOD mobic -renal function worsened with daily mobic/nsaids  3. Chronic constipation -cont current regimen, no recent difficulty  4. Senile osteoporosis -cont vitamin D and evista which she's been on and has tolerated  5. Paranoid delusion ([place]) -ongoing issue intermittently, on abilify for this as it was really affecting her function and mood, cont same -also cont SSRI for depressive component  6. Degenerative arthritis of  cervical spine with nerve compression -cont pain  regimen as is  Pharmacist, hospital Communication: discussed with SNF 3 nurse  [place]. Karion Cudd, D.O. [facility] [address],  [address] Cell Phone (Mon-Fri 8am-5pm):  [phone] On Call:  [phone] & follow prompts after 5pm & weekends Office Phone:  [phone] Office Fax:  [phone]

## 2018-05-16 DIAGNOSIS — M25651 Stiffness of right hip, not elsewhere classified: Secondary | ICD-10-CM | POA: Diagnosis not present

## 2018-05-16 DIAGNOSIS — M25551 Pain in right hip: Secondary | ICD-10-CM | POA: Diagnosis not present

## 2018-05-16 DIAGNOSIS — M25661 Stiffness of right knee, not elsewhere classified: Secondary | ICD-10-CM | POA: Diagnosis not present

## 2018-05-16 DIAGNOSIS — R2689 Other abnormalities of gait and mobility: Secondary | ICD-10-CM | POA: Diagnosis not present

## 2018-05-16 DIAGNOSIS — M25561 Pain in right knee: Secondary | ICD-10-CM | POA: Diagnosis not present

## 2018-05-16 DIAGNOSIS — M542 Cervicalgia: Secondary | ICD-10-CM | POA: Diagnosis not present

## 2018-05-17 DIAGNOSIS — M25551 Pain in right hip: Secondary | ICD-10-CM | POA: Diagnosis not present

## 2018-05-17 DIAGNOSIS — M25561 Pain in right knee: Secondary | ICD-10-CM | POA: Diagnosis not present

## 2018-05-17 DIAGNOSIS — M25651 Stiffness of right hip, not elsewhere classified: Secondary | ICD-10-CM | POA: Diagnosis not present

## 2018-05-17 DIAGNOSIS — R2689 Other abnormalities of gait and mobility: Secondary | ICD-10-CM | POA: Diagnosis not present

## 2018-05-17 DIAGNOSIS — M25661 Stiffness of right knee, not elsewhere classified: Secondary | ICD-10-CM | POA: Diagnosis not present

## 2018-05-17 DIAGNOSIS — M542 Cervicalgia: Secondary | ICD-10-CM | POA: Diagnosis not present

## 2018-05-18 DIAGNOSIS — R2689 Other abnormalities of gait and mobility: Secondary | ICD-10-CM | POA: Diagnosis not present

## 2018-05-18 DIAGNOSIS — M25561 Pain in right knee: Secondary | ICD-10-CM | POA: Diagnosis not present

## 2018-05-18 DIAGNOSIS — M542 Cervicalgia: Secondary | ICD-10-CM | POA: Diagnosis not present

## 2018-05-18 DIAGNOSIS — M25661 Stiffness of right knee, not elsewhere classified: Secondary | ICD-10-CM | POA: Diagnosis not present

## 2018-05-18 DIAGNOSIS — M25651 Stiffness of right hip, not elsewhere classified: Secondary | ICD-10-CM | POA: Diagnosis not present

## 2018-05-18 DIAGNOSIS — M25551 Pain in right hip: Secondary | ICD-10-CM | POA: Diagnosis not present

## 2018-05-21 DIAGNOSIS — M25651 Stiffness of right hip, not elsewhere classified: Secondary | ICD-10-CM | POA: Diagnosis not present

## 2018-05-21 DIAGNOSIS — M542 Cervicalgia: Secondary | ICD-10-CM | POA: Diagnosis not present

## 2018-05-21 DIAGNOSIS — D649 Anemia, unspecified: Secondary | ICD-10-CM | POA: Diagnosis not present

## 2018-05-21 DIAGNOSIS — Z79899 Other long term (current) drug therapy: Secondary | ICD-10-CM | POA: Diagnosis not present

## 2018-05-21 DIAGNOSIS — M25551 Pain in right hip: Secondary | ICD-10-CM | POA: Diagnosis not present

## 2018-05-21 DIAGNOSIS — M25561 Pain in right knee: Secondary | ICD-10-CM | POA: Diagnosis not present

## 2018-05-21 DIAGNOSIS — M25661 Stiffness of right knee, not elsewhere classified: Secondary | ICD-10-CM | POA: Diagnosis not present

## 2018-05-21 DIAGNOSIS — R2689 Other abnormalities of gait and mobility: Secondary | ICD-10-CM | POA: Diagnosis not present

## 2018-05-21 LAB — BASIC METABOLIC PANEL
BUN: 20 (ref 4–21)
CREATININE: 1 (ref 0.5–1.1)
Glucose: 76
POTASSIUM: 5.8 — AB (ref 3.4–5.3)
Sodium: 138 (ref 137–147)

## 2018-05-22 DIAGNOSIS — M25651 Stiffness of right hip, not elsewhere classified: Secondary | ICD-10-CM | POA: Diagnosis not present

## 2018-05-22 DIAGNOSIS — M25661 Stiffness of right knee, not elsewhere classified: Secondary | ICD-10-CM | POA: Diagnosis not present

## 2018-05-22 DIAGNOSIS — M25561 Pain in right knee: Secondary | ICD-10-CM | POA: Diagnosis not present

## 2018-05-22 DIAGNOSIS — R2689 Other abnormalities of gait and mobility: Secondary | ICD-10-CM | POA: Diagnosis not present

## 2018-05-22 DIAGNOSIS — M25551 Pain in right hip: Secondary | ICD-10-CM | POA: Diagnosis not present

## 2018-05-22 DIAGNOSIS — M542 Cervicalgia: Secondary | ICD-10-CM | POA: Diagnosis not present

## 2018-05-23 DIAGNOSIS — M25551 Pain in right hip: Secondary | ICD-10-CM | POA: Diagnosis not present

## 2018-05-23 DIAGNOSIS — M25661 Stiffness of right knee, not elsewhere classified: Secondary | ICD-10-CM | POA: Diagnosis not present

## 2018-05-23 DIAGNOSIS — M25651 Stiffness of right hip, not elsewhere classified: Secondary | ICD-10-CM | POA: Diagnosis not present

## 2018-05-23 DIAGNOSIS — M25561 Pain in right knee: Secondary | ICD-10-CM | POA: Diagnosis not present

## 2018-05-23 DIAGNOSIS — M542 Cervicalgia: Secondary | ICD-10-CM | POA: Diagnosis not present

## 2018-05-23 DIAGNOSIS — R2689 Other abnormalities of gait and mobility: Secondary | ICD-10-CM | POA: Diagnosis not present

## 2018-05-24 DIAGNOSIS — M25651 Stiffness of right hip, not elsewhere classified: Secondary | ICD-10-CM | POA: Diagnosis not present

## 2018-05-24 DIAGNOSIS — M542 Cervicalgia: Secondary | ICD-10-CM | POA: Diagnosis not present

## 2018-05-24 DIAGNOSIS — M25551 Pain in right hip: Secondary | ICD-10-CM | POA: Diagnosis not present

## 2018-05-24 DIAGNOSIS — R2689 Other abnormalities of gait and mobility: Secondary | ICD-10-CM | POA: Diagnosis not present

## 2018-05-24 DIAGNOSIS — M25561 Pain in right knee: Secondary | ICD-10-CM | POA: Diagnosis not present

## 2018-05-24 DIAGNOSIS — M25661 Stiffness of right knee, not elsewhere classified: Secondary | ICD-10-CM | POA: Diagnosis not present

## 2018-05-25 DIAGNOSIS — R2689 Other abnormalities of gait and mobility: Secondary | ICD-10-CM | POA: Diagnosis not present

## 2018-05-25 DIAGNOSIS — M25661 Stiffness of right knee, not elsewhere classified: Secondary | ICD-10-CM | POA: Diagnosis not present

## 2018-05-25 DIAGNOSIS — M25551 Pain in right hip: Secondary | ICD-10-CM | POA: Diagnosis not present

## 2018-05-25 DIAGNOSIS — M25561 Pain in right knee: Secondary | ICD-10-CM | POA: Diagnosis not present

## 2018-05-25 DIAGNOSIS — M25651 Stiffness of right hip, not elsewhere classified: Secondary | ICD-10-CM | POA: Diagnosis not present

## 2018-05-25 DIAGNOSIS — M542 Cervicalgia: Secondary | ICD-10-CM | POA: Diagnosis not present

## 2018-05-28 DIAGNOSIS — M542 Cervicalgia: Secondary | ICD-10-CM | POA: Diagnosis not present

## 2018-05-28 DIAGNOSIS — M25651 Stiffness of right hip, not elsewhere classified: Secondary | ICD-10-CM | POA: Diagnosis not present

## 2018-05-28 DIAGNOSIS — M25561 Pain in right knee: Secondary | ICD-10-CM | POA: Diagnosis not present

## 2018-05-28 DIAGNOSIS — M25551 Pain in right hip: Secondary | ICD-10-CM | POA: Diagnosis not present

## 2018-05-28 DIAGNOSIS — M25661 Stiffness of right knee, not elsewhere classified: Secondary | ICD-10-CM | POA: Diagnosis not present

## 2018-05-28 DIAGNOSIS — R2689 Other abnormalities of gait and mobility: Secondary | ICD-10-CM | POA: Diagnosis not present

## 2018-05-29 DIAGNOSIS — M25651 Stiffness of right hip, not elsewhere classified: Secondary | ICD-10-CM | POA: Diagnosis not present

## 2018-05-29 DIAGNOSIS — M542 Cervicalgia: Secondary | ICD-10-CM | POA: Diagnosis not present

## 2018-05-29 DIAGNOSIS — F0280 Dementia in other diseases classified elsewhere without behavioral disturbance: Secondary | ICD-10-CM | POA: Diagnosis not present

## 2018-05-29 DIAGNOSIS — R2689 Other abnormalities of gait and mobility: Secondary | ICD-10-CM | POA: Diagnosis not present

## 2018-05-29 DIAGNOSIS — M25561 Pain in right knee: Secondary | ICD-10-CM | POA: Diagnosis not present

## 2018-05-29 DIAGNOSIS — G301 Alzheimer's disease with late onset: Secondary | ICD-10-CM | POA: Diagnosis not present

## 2018-05-29 DIAGNOSIS — M810 Age-related osteoporosis without current pathological fracture: Secondary | ICD-10-CM | POA: Diagnosis not present

## 2018-05-29 DIAGNOSIS — M4722 Other spondylosis with radiculopathy, cervical region: Secondary | ICD-10-CM | POA: Diagnosis not present

## 2018-05-29 DIAGNOSIS — G8929 Other chronic pain: Secondary | ICD-10-CM | POA: Diagnosis not present

## 2018-05-29 DIAGNOSIS — M1611 Unilateral primary osteoarthritis, right hip: Secondary | ICD-10-CM | POA: Diagnosis not present

## 2018-05-29 DIAGNOSIS — M1711 Unilateral primary osteoarthritis, right knee: Secondary | ICD-10-CM | POA: Diagnosis not present

## 2018-05-29 DIAGNOSIS — M25661 Stiffness of right knee, not elsewhere classified: Secondary | ICD-10-CM | POA: Diagnosis not present

## 2018-05-29 DIAGNOSIS — M25551 Pain in right hip: Secondary | ICD-10-CM | POA: Diagnosis not present

## 2018-05-31 DIAGNOSIS — M25551 Pain in right hip: Secondary | ICD-10-CM | POA: Diagnosis not present

## 2018-05-31 DIAGNOSIS — M25561 Pain in right knee: Secondary | ICD-10-CM | POA: Diagnosis not present

## 2018-05-31 DIAGNOSIS — R2689 Other abnormalities of gait and mobility: Secondary | ICD-10-CM | POA: Diagnosis not present

## 2018-05-31 DIAGNOSIS — M25651 Stiffness of right hip, not elsewhere classified: Secondary | ICD-10-CM | POA: Diagnosis not present

## 2018-05-31 DIAGNOSIS — M542 Cervicalgia: Secondary | ICD-10-CM | POA: Diagnosis not present

## 2018-05-31 DIAGNOSIS — M25661 Stiffness of right knee, not elsewhere classified: Secondary | ICD-10-CM | POA: Diagnosis not present

## 2018-06-04 DIAGNOSIS — R2689 Other abnormalities of gait and mobility: Secondary | ICD-10-CM | POA: Diagnosis not present

## 2018-06-04 DIAGNOSIS — M542 Cervicalgia: Secondary | ICD-10-CM | POA: Diagnosis not present

## 2018-06-04 DIAGNOSIS — M25551 Pain in right hip: Secondary | ICD-10-CM | POA: Diagnosis not present

## 2018-06-04 DIAGNOSIS — M25561 Pain in right knee: Secondary | ICD-10-CM | POA: Diagnosis not present

## 2018-06-04 DIAGNOSIS — M25661 Stiffness of right knee, not elsewhere classified: Secondary | ICD-10-CM | POA: Diagnosis not present

## 2018-06-04 DIAGNOSIS — M25651 Stiffness of right hip, not elsewhere classified: Secondary | ICD-10-CM | POA: Diagnosis not present

## 2018-06-05 DIAGNOSIS — M25651 Stiffness of right hip, not elsewhere classified: Secondary | ICD-10-CM | POA: Diagnosis not present

## 2018-06-05 DIAGNOSIS — M25551 Pain in right hip: Secondary | ICD-10-CM | POA: Diagnosis not present

## 2018-06-05 DIAGNOSIS — M542 Cervicalgia: Secondary | ICD-10-CM | POA: Diagnosis not present

## 2018-06-05 DIAGNOSIS — M25561 Pain in right knee: Secondary | ICD-10-CM | POA: Diagnosis not present

## 2018-06-05 DIAGNOSIS — R2689 Other abnormalities of gait and mobility: Secondary | ICD-10-CM | POA: Diagnosis not present

## 2018-06-05 DIAGNOSIS — M25661 Stiffness of right knee, not elsewhere classified: Secondary | ICD-10-CM | POA: Diagnosis not present

## 2018-06-06 ENCOUNTER — Telehealth: Payer: Self-pay | Admitting: Physical Medicine & Rehabilitation

## 2018-06-06 NOTE — Telephone Encounter (Signed)
How did she respond to Lidocaine patch, TENS?  What were the results of her xray/was it performed?  She should also be due to an injection in about a week; is she scheduled?

## 2018-06-06 NOTE — Telephone Encounter (Signed)
Misty RN with [facility] needs to let us know that patient is still complaining of pain, right legs and neck. Needs to know what else they can do for patient.

## 2018-06-06 NOTE — Telephone Encounter (Signed)
Contacted [facility] and passed on Dr. Ena Dawley concerns regarding lidoderm, TENS unit, and x-rays..,..they will fax x-ray results and check the status of lidoderm and TENS unit to see if the are achieving efficacy.

## 2018-06-11 ENCOUNTER — Non-Acute Institutional Stay (SKILLED_NURSING_FACILITY): Payer: Medicare Other | Admitting: Adult Health

## 2018-06-11 DIAGNOSIS — G250 Essential tremor: Secondary | ICD-10-CM | POA: Diagnosis not present

## 2018-06-11 DIAGNOSIS — M25551 Pain in right hip: Secondary | ICD-10-CM | POA: Diagnosis not present

## 2018-06-11 DIAGNOSIS — N3281 Overactive bladder: Secondary | ICD-10-CM

## 2018-06-11 DIAGNOSIS — N184 Chronic kidney disease, stage 4 (severe): Secondary | ICD-10-CM

## 2018-06-11 DIAGNOSIS — F0280 Dementia in other diseases classified elsewhere without behavioral disturbance: Secondary | ICD-10-CM

## 2018-06-11 DIAGNOSIS — G8929 Other chronic pain: Secondary | ICD-10-CM | POA: Diagnosis not present

## 2018-06-11 DIAGNOSIS — K5909 Other constipation: Secondary | ICD-10-CM | POA: Diagnosis not present

## 2018-06-11 DIAGNOSIS — F324 Major depressive disorder, single episode, in partial remission: Secondary | ICD-10-CM

## 2018-06-11 DIAGNOSIS — G301 Alzheimer's disease with late onset: Secondary | ICD-10-CM | POA: Diagnosis not present

## 2018-06-13 ENCOUNTER — Ambulatory Visit: Payer: Medicare Other | Admitting: Physical Medicine & Rehabilitation

## 2018-06-15 ENCOUNTER — Encounter: Payer: Self-pay | Admitting: Internal Medicine

## 2018-06-20 ENCOUNTER — Encounter: Payer: Self-pay | Admitting: Adult Health

## 2018-06-20 ENCOUNTER — Encounter: Payer: Medicare Other | Attending: Physical Medicine & Rehabilitation | Admitting: Physical Medicine & Rehabilitation

## 2018-06-20 ENCOUNTER — Encounter: Payer: Self-pay | Admitting: Physical Medicine & Rehabilitation

## 2018-06-20 VITALS — BP 122/74 | HR 64 | Ht 61.0 in | Wt 102.0 lb

## 2018-06-20 DIAGNOSIS — M791 Myalgia, unspecified site: Secondary | ICD-10-CM | POA: Diagnosis not present

## 2018-06-20 DIAGNOSIS — M542 Cervicalgia: Secondary | ICD-10-CM | POA: Diagnosis not present

## 2018-06-20 DIAGNOSIS — F0280 Dementia in other diseases classified elsewhere without behavioral disturbance: Secondary | ICD-10-CM | POA: Insufficient documentation

## 2018-06-20 DIAGNOSIS — G309 Alzheimer's disease, unspecified: Secondary | ICD-10-CM | POA: Insufficient documentation

## 2018-06-20 DIAGNOSIS — F329 Major depressive disorder, single episode, unspecified: Secondary | ICD-10-CM | POA: Insufficient documentation

## 2018-06-20 DIAGNOSIS — G8929 Other chronic pain: Secondary | ICD-10-CM | POA: Insufficient documentation

## 2018-06-20 DIAGNOSIS — G243 Spasmodic torticollis: Secondary | ICD-10-CM

## 2018-06-20 DIAGNOSIS — M531 Cervicobrachial syndrome: Secondary | ICD-10-CM | POA: Diagnosis not present

## 2018-06-20 DIAGNOSIS — G479 Sleep disorder, unspecified: Secondary | ICD-10-CM | POA: Insufficient documentation

## 2018-06-20 DIAGNOSIS — Z87891 Personal history of nicotine dependence: Secondary | ICD-10-CM | POA: Diagnosis not present

## 2018-06-20 DIAGNOSIS — M8580 Other specified disorders of bone density and structure, unspecified site: Secondary | ICD-10-CM | POA: Diagnosis not present

## 2018-06-20 DIAGNOSIS — M4722 Other spondylosis with radiculopathy, cervical region: Secondary | ICD-10-CM | POA: Insufficient documentation

## 2018-06-20 DIAGNOSIS — N184 Chronic kidney disease, stage 4 (severe): Secondary | ICD-10-CM | POA: Insufficient documentation

## 2018-06-20 DIAGNOSIS — R269 Unspecified abnormalities of gait and mobility: Secondary | ICD-10-CM | POA: Diagnosis not present

## 2018-06-20 DIAGNOSIS — R251 Tremor, unspecified: Secondary | ICD-10-CM

## 2018-06-20 NOTE — Progress Notes (Signed)
Botox: Procedure Note Patient Name: Melanie Freeman DOB: 12-06-1928 MRN: [id number]  Date: 06/20/18   Procedure: Botulinum toxin administration Guidance: EMG Diagnosis: Cervical dystonia/Laterocollis Attending: Delice Lesch, MD   Informed consent: Risks, benefits & options of the procedure are explained to the patient (and/or family). The patient elects to proceed with procedure. Risks include but are not limited to weakness, respiratory distress, dry mouth, ptosis, antibody formation, worsening of some areas of function. Benefits include decreased abnormal muscle tone, improved hygiene and positioning, decreased skin breakdown and, in some cases, decreased pain. Options include conservative management with oral antispasticity agents, phenol chemodenervation of nerve or at motor nerve branches. More invasive options include intrathecal balcofen adminstration for appropriate candidates. Surgical options may include tendon lengthening or transposition or, rarely, dorsal rhizotomy.   History/Physical Examination: 84 y.o. with pmh of cervical spondylosis with radiculopathy, alzheimer, depression/anxiety, OA presents with cervical dystonia  Limitted ROM in neck, limitation with b/l rotation  Previous Treatments: Range of motion Indication for guidance: Target active muscules  Procedure: Botulinum toxin was mixed with preservative free saline with a dilution of 1cc to 100 units. Targeted limb and muscles were identified. The skin was prepped with alcohol swabs and placement of needle tip in targeted muscle was confirmed using appropriate guidance. Prior to injection, positioning of needle tip outside of blood vessel was determined by pulling back on syringe plunger.  MUSCLE UNITS Right SCM 60 Units  Right upper trapezius 40U  Total units used: 086  Complications: None  Plan: Follow up in 6 weeks  Aulden Calise Anil Bode Pieper 10:11 AM

## 2018-06-20 NOTE — Progress Notes (Signed)
Location:  Occupational psychologist of Service:  SNF (31) Provider:   Cindi Carbon, ANP [place] [phone]   Gayland Curry, DO  Patient Care Team: Gayland Curry, DO as PCP - General (Geriatric Medicine) Bjorn Loser, MD as Consulting Physician (Urology)  Extended Emergency Contact Information Primary Emergency Contact: [facility] Address: [address]          [place], [place] [address] Johnnette Litter of [place] Work Phone: [phone] Mobile Phone: [phone] Relation: Daughter Secondary Emergency Contact: [facility] Address: [address]          [place], [place] [address] Johnnette Litter of [place] Mobile Phone: [phone] Relation: Other  Code Status:  DNR Goals of care: Advanced Directive information Advanced Directives 05/15/2018  Does Patient Have a Medical Advance Directive? Yes  Type of Paramedic of [place];Living will;Out of facility DNR (pink MOST or yellow form)  Does patient want to make changes to medical advance directive? No - Patient declined  Copy of [place] in Chart? Yes  Would patient like information on creating a medical advance directive? -  Pre-existing out of facility DNR order (yellow form or pink MOST form) Yellow form placed in chart (order not valid for inpatient use)     Chief Complaint  Patient presents with  . Medical Management of Chronic Issues    HPI:  Pt is a 84 y.o. female seen today for medical management of chronic diseases.    She continues with chronic right hip and knee pain with a hx of right hip pinning due to impacted femoral neck fracture. She is on mobic which was reduced due to decreased renal function. (improvement noted).  She also uses voltaren gel and tylenol for pain. Due to her dementia it is difficult to assess her pain level but staff have noted that it has worsened with the reduction in mobic. She has tried neurontin  and baclofen which apparently caused s/e.  Followed by PMR for chronic pain and torticollis.  Reports regular BMs on senna s  No change in chronic head tremor.  Has periods of paranoia about the staff and is currently on abilify which seems to help. More forgetful over time but remains able to follow commands and answer questions. Believes her leg is broken and can't be fixed and has many psychosomatic complaints over time.   Past Medical History:  Diagnosis Date  . Allergic contact dermatitis   . Anxiety   . Arthritis   . Closed fracture of neck of femur ([facility]) 01/25/2016  . Cystitis, interstitial   . Depression   . Femur fracture, right ([place]) 01/26/16  . Fracture of right inferior pubic ramus ([place]) 01/26/16  . Hepatitis    over 50 years ago  . Long-term use of high-risk medication   . Osteopenia   . Tremors of nervous system   . Vertigo    Past Surgical History:  Procedure Laterality Date  . APPENDECTOMY    . DILATATION & CURETTAGE/HYSTEROSCOPY WITH MYOSURE N/A 06/26/2015   Procedure: DILATATION & CURETTAGE/HYSTEROSCOPY WITH MYOSURE;  Surgeon: Arvella Nigh, MD;  Location: [facility];  Service: Gynecology;  Laterality: N/A;  . FRACTURE SURGERY  2015   left hip fracture  . HIP ORIF W/ CAPSULOTOMY Left 10/22/2013   [name]   . RIGHT hip percutaneous pinning of impacted femoral neck fracture  01/26/16   [facility]--Dr. Jaynee Eagles orthopedics  . TONSILLECTOMY      Allergies  Allergen  Reactions  . Baclofen Other (See Comments)    Made her extremely confused, and falls  . Latex Rash  . Other Other (See Comments)    Pt reports was seen by neurologist for tremors and was allergic to all medications prescribed for tremors, but does not know names or reaction. Patient reports being allergic to polyester. Does not know what reaction would be. States she was seen by a doctor who tested her for multiple allergies and she had positive results.  . Doxycycline Other (See Comments)    unknown   . Hydrocortisone   . Lopid [Gemfibrozil]   . Sulfa Antibiotics Other (See Comments)    unknown    Outpatient Encounter Medications as of 06/11/2018  Medication Sig  . acetaminophen (TYLENOL) 325 MG tablet Take 650 mg by mouth 2 (two) times daily.  . ARIPiprazole (ABILIFY) 2 MG tablet Take 2 mg by mouth daily.  . cephALEXin (KEFLEX) 250 MG capsule Take 250 mg by mouth daily before lunch.  . Cholecalciferol (D3 HIGH POTENCY) 1000 UNITS capsule Take 1,000 Units by mouth daily.   . citalopram (CELEXA) 20 MG tablet Take 20 mg by mouth daily.  . diclofenac sodium (VOLTAREN) 1 % GEL Apply 2 g topically 4 (four) times daily. Apply to right knee and hip  . fluocinolone (VANOS) 0.01 % cream Apply 1 application topically 2 (two) times daily as needed (skin rash).  . Liniments (SALONPAS PAIN RELIEF PATCH EX) Apply topically 2 (two) times daily.  . meloxicam (MOBIC) 7.5 MG tablet Take 7.5 mg by mouth every other day.   . memantine (NAMENDA XR) 14 MG CP24 24 hr capsule Take 14 mg by mouth daily.  . mirabegron ER (MYRBETRIQ) 50 MG TB24 tablet Take 50 mg by mouth daily.  [name] Glycol-Propyl Glycol (SYSTANE OP) Place 1 drop into both eyes 4 (four) times daily.  . propranolol (INDERAL) 10 MG tablet Take 0.5 tablets (5 mg total) by mouth 3 (three) times daily.  . raloxifene (EVISTA) 60 MG tablet Take 60 mg by mouth daily.  Marland Kitchen senna-docusate (SENOKOT-S) 8.6-50 MG tablet Take 2 tablets by mouth 2 (two) times daily.  Marland Kitchen tiZANidine (ZANAFLEX) 4 MG tablet Take 0.5 tablets (2 mg total) by mouth 2 (two) times daily as needed for muscle spasms.  . vitamin B-12 (CYANOCOBALAMIN) 1000 MCG tablet Take 1,000 mcg by mouth every other day.  . [DISCONTINUED] Difluprednate (DUREZOL) 0.05 % EMUL Apply 1 drop to eye 2 (two) times daily.   No facility-administered encounter medications on file as of 06/11/2018.     Review of Systems  Constitutional: Negative for activity change, appetite change, chills, diaphoresis,  fatigue, fever and unexpected weight change.  HENT: Negative for congestion.   Respiratory: Negative for cough, shortness of breath and wheezing.   Cardiovascular: Negative for chest pain, palpitations and leg swelling.  Gastrointestinal: Negative for abdominal distention, abdominal pain, constipation and diarrhea.  Genitourinary: Negative for difficulty urinating and dysuria.  Musculoskeletal: Positive for arthralgias and gait problem. Negative for back pain, joint swelling and myalgias.  Neurological: Positive for tremors. Negative for dizziness, seizures, syncope, facial asymmetry, speech difficulty, weakness, light-headedness, numbness and headaches.  Psychiatric/Behavioral: Positive for behavioral problems and confusion. Negative for agitation, self-injury, sleep disturbance and suicidal ideas. The patient is nervous/anxious.     Immunization History  Administered Date(s) Administered  . Influenza Inj Mdck Quad Pf 06/16/2016  . Influenza-Unspecified 06/18/2015, 06/22/2017  . Pneumococcal Conjugate-13 09/27/2014   Pertinent  Health Maintenance Due  Topic Date  Due  . INFLUENZA VACCINE  03/29/2018  . PNA vac Low Risk Adult (2 of 2 - PPSV23) 08/29/2018 (Originally 09/28/2015)  . DEXA SCAN  Completed   Fall Risk  04/20/2018 02/20/2018 01/18/2018 12/07/2017 10/26/2017  Falls in the past year? No Yes No Yes No  Comment - - - - -  Number falls in past yr: - 1 - 2 or more -  Injury with Fall? - No - - -  Comment - - - - -  Risk Factor Category  - - - - -  Risk for fall due to : - - - - -   Functional Status Survey:    Vitals:   06/20/18 0819  BP: 117/67  Pulse: 71  Resp: 20  Temp: (!) 97.4 F (36.3 C)  SpO2: 95%  Weight: 102 lb 9.6 oz (46.5 kg)   Body mass index is 19.39 kg/m. Physical Exam  Constitutional: No distress.  Frail thin white female  HENT:  Head: Normocephalic and atraumatic.  Mouth/Throat: No oropharyngeal exudate.  Eyes: Pupils are equal, round, and reactive  to light. Conjunctivae are normal. Right eye exhibits no discharge. Left eye exhibits no discharge.  Neck: No JVD present.  Cardiovascular: Normal rate and regular rhythm.  No murmur heard. Pulmonary/Chest: Effort normal and breath sounds normal. No respiratory distress. She has no wheezes.  Abdominal: Soft. Bowel sounds are normal. She exhibits no distension. There is no tenderness.  Musculoskeletal: She exhibits tenderness (right knee. ). She exhibits no edema.  Pain with ROM to the right knee and right hip. No swelling, deformity, redness, or warmth  Neurological: She is alert.  Oriented x 2, MAE, pleasant  Skin: Skin is warm and dry. She is not diaphoretic.  Psychiatric: She has a normal mood and affect.    Labs reviewed: Recent Labs    10/17/17 0600 04/16/18 0900 05/21/18  NA 140 139 138  K 4.4 4.7 5.8*  BUN 19 27* 20  CREATININE 1.0 1.3* 1.0   Recent Labs    09/25/17  AST 19  ALT 8  ALKPHOS 61   Recent Labs    09/25/17  WBC 7.8  HGB 13.0  HCT 40  PLT 201   Lab Results  Component Value Date   TSH 2.19 03/04/2016   No results found for: HGBA1C Lab Results  Component Value Date   CHOL 204 (A) 09/25/2017   HDL 51 09/25/2017   LDLCALC 134 09/25/2017   TRIG 91 09/25/2017    Significant Diagnostic Results in last 30 days:  No results found.  Assessment/Plan 1. Chronic constipation Controlled with senokot-s  2. Benign essential tremor Continue propranolol 5 mg tid  3. Late onset Alzheimer's disease without behavioral disturbance ([facility]) Advanced with paranoia Continue Namenda 14 mg qd Continue Abilify 2 mg qd  4. Overactive bladder Continue myrbetriq 50 mg qd  5. Chronic right hip pain And right knee Followed by PMR.  Continue Voltaren gel, tylenol, and mobic  Avoid narcotics if possible due potential for worsening dementia.   6. CKD IV CrCl 29.49 mL/min Improved with reduced dosing of mobic Continue to monitor    Family/ staff  Communication: resident and staff Discussed her case with Dr Mariea Clonts  Labs/tests ordered:  NA

## 2018-06-28 DIAGNOSIS — Z23 Encounter for immunization: Secondary | ICD-10-CM | POA: Diagnosis not present

## 2018-07-09 ENCOUNTER — Non-Acute Institutional Stay (SKILLED_NURSING_FACILITY): Payer: Medicare Other | Admitting: Adult Health

## 2018-07-09 ENCOUNTER — Encounter: Payer: Self-pay | Admitting: Adult Health

## 2018-07-09 DIAGNOSIS — F0280 Dementia in other diseases classified elsewhere without behavioral disturbance: Secondary | ICD-10-CM

## 2018-07-09 DIAGNOSIS — G301 Alzheimer's disease with late onset: Secondary | ICD-10-CM

## 2018-07-09 DIAGNOSIS — M436 Torticollis: Secondary | ICD-10-CM | POA: Diagnosis not present

## 2018-07-09 DIAGNOSIS — K5909 Other constipation: Secondary | ICD-10-CM

## 2018-07-09 DIAGNOSIS — F324 Major depressive disorder, single episode, in partial remission: Secondary | ICD-10-CM

## 2018-07-09 DIAGNOSIS — G250 Essential tremor: Secondary | ICD-10-CM

## 2018-07-09 DIAGNOSIS — N390 Urinary tract infection, site not specified: Secondary | ICD-10-CM | POA: Diagnosis not present

## 2018-07-09 DIAGNOSIS — N184 Chronic kidney disease, stage 4 (severe): Secondary | ICD-10-CM | POA: Diagnosis not present

## 2018-07-09 NOTE — Progress Notes (Signed)
Location:  Occupational psychologist of Service:  SNF (31) Provider:   Cindi Carbon, ANP [place] [phone]   Gayland Curry, DO  Patient Care Team: Gayland Curry, DO as PCP - General (Geriatric Medicine) Bjorn Loser, MD as Consulting Physician (Urology)  Extended Emergency Contact Information Primary Emergency Contact: [facility] Address: [address]          [place], [place] [address] Johnnette Litter of [place] Work Phone: [phone] Mobile Phone: [phone] Relation: Daughter Secondary Emergency Contact: [facility] Address: [address]          [place], [place] [address] Johnnette Litter of [place] Mobile Phone: [phone] Relation: Other  Code Status:  DNR Goals of care: Advanced Directive information Advanced Directives 05/15/2018  Does Patient Have a Medical Advance Directive? Yes  Type of Paramedic of [place];Living will;Out of facility DNR (pink MOST or yellow form)  Does patient want to make changes to medical advance directive? No - Patient declined  Copy of [place] in Chart? Yes  Would patient like information on creating a medical advance directive? -  Pre-existing out of facility DNR order (yellow form or pink MOST form) Yellow form placed in chart (order not valid for inpatient use)     Chief Complaint  Patient presents with  . Medical Management of Chronic Issues    HPI:  Pt is a 84 y.o. female seen today for medical management of chronic diseases.    Dementia: Staff report that she periodically has delusions but overall is improved since starting abilify. She spends most of her time in the wheelchair but can transfer herself to the chair or bed 03/03/18 MMSE 23/30 08/10/17 MMSE 16/30  Continues with neck pain due to torticollis followed by Dr Posey Pronto at [facility], receiving neck injections. There are times where she seems pain free but other times where she  fixates on her health issues and has a known issue with somatization.  Depression; resident and staff deny any depressive symptoms. Weight remains stable but low, sleeps fairly well.  Recurrent UTI: on keflex with no new issues  CKD OE:[id number] with reduction in mobic Lab Results  Component Value Date   BUN 20 05/21/2018   Lab Results  Component Value Date   CREATININE 1.0 05/21/2018   Constipation: regular BMs reported  Benign essential tremor: to head and hands, controlled with propranolol   Past Medical History:  Diagnosis Date  . Allergic contact dermatitis   . Anxiety   . Arthritis   . Closed fracture of neck of femur ([place]) 01/25/2016  . Cystitis, interstitial   . Depression   . Femur fracture, right ([place]) 01/26/16  . Fracture of right inferior pubic ramus ([place]) 01/26/16  . Hepatitis    over 50 years ago  . Long-term use of high-risk medication   . Osteopenia   . Tremors of nervous system   . Vertigo    Past Surgical History:  Procedure Laterality Date  . APPENDECTOMY    . DILATATION & CURETTAGE/HYSTEROSCOPY WITH MYOSURE N/A 06/26/2015   Procedure: DILATATION & CURETTAGE/HYSTEROSCOPY WITH MYOSURE;  Surgeon: Arvella Nigh, MD;  Location: [facility];  Service: Gynecology;  Laterality: N/A;  . FRACTURE SURGERY  2015   left hip fracture  . HIP ORIF W/ CAPSULOTOMY Left 10/22/2013   [name]   . RIGHT hip percutaneous pinning of impacted femoral neck fracture  01/26/16   [facility]--Dr. Jaynee Eagles orthopedics  . TONSILLECTOMY  Allergies  Allergen Reactions  . Baclofen Other (See Comments)    Made her extremely confused, and falls  . Latex Rash  . Other Other (See Comments)    Pt reports was seen by neurologist for tremors and was allergic to all medications prescribed for tremors, but does not know names or reaction. Patient reports being allergic to polyester. Does not know what reaction would be. States she was seen by a doctor who tested her for multiple  allergies and she had positive results.  . Doxycycline Other (See Comments)    unknown  . Hydrocortisone   . Lopid [Gemfibrozil]   . Sulfa Antibiotics Other (See Comments)    unknown    Outpatient Encounter Medications as of 07/09/2018  Medication Sig  . acetaminophen (TYLENOL) 325 MG tablet Take 650 mg by mouth 2 (two) times daily.  . ARIPiprazole (ABILIFY) 2 MG tablet Take 2 mg by mouth daily.  . cephALEXin (KEFLEX) 250 MG capsule Take 250 mg by mouth daily before lunch.  . Cholecalciferol (D3 HIGH POTENCY) 1000 UNITS capsule Take 1,000 Units by mouth daily.   . citalopram (CELEXA) 20 MG tablet Take 20 mg by mouth daily.  . diclofenac sodium (VOLTAREN) 1 % GEL Apply 2 g topically 4 (four) times daily. Apply to right knee and hip  . fluocinolone (VANOS) 0.01 % cream Apply 1 application topically 2 (two) times daily as needed (skin rash).  . Liniments (SALONPAS PAIN RELIEF PATCH EX) Apply topically 2 (two) times daily.  . meloxicam (MOBIC) 7.5 MG tablet Take 7.5 mg by mouth every other day.   . memantine (NAMENDA XR) 14 MG CP24 24 hr capsule Take 14 mg by mouth daily.  . mirabegron ER (MYRBETRIQ) 50 MG TB24 tablet Take 50 mg by mouth daily.  [name] Glycol-Propyl Glycol (SYSTANE OP) Place 1 drop into both eyes 4 (four) times daily as needed.   . propranolol (INDERAL) 10 MG tablet Take 0.5 tablets (5 mg total) by mouth 3 (three) times daily.  . raloxifene (EVISTA) 60 MG tablet Take 60 mg by mouth daily.  Marland Kitchen senna-docusate (SENOKOT-S) 8.6-50 MG tablet Take 2 tablets by mouth 2 (two) times daily.  Marland Kitchen tiZANidine (ZANAFLEX) 4 MG tablet Take 0.5 tablets (2 mg total) by mouth 2 (two) times daily as needed for muscle spasms.  . vitamin B-12 (CYANOCOBALAMIN) 1000 MCG tablet Take 1,000 mcg by mouth every other day.   No facility-administered encounter medications on file as of 07/09/2018.     Review of Systems  Constitutional: Negative for activity change, appetite change, chills,  diaphoresis, fatigue, fever and unexpected weight change.  HENT: Negative for congestion.   Respiratory: Negative for cough, shortness of breath and wheezing.   Cardiovascular: Negative for chest pain, palpitations and leg swelling.  Gastrointestinal: Negative for abdominal distention, abdominal pain, constipation and diarrhea.  Genitourinary: Negative for difficulty urinating and dysuria.  Musculoskeletal: Positive for arthralgias and gait problem. Negative for back pain, joint swelling and myalgias.  Neurological: Positive for tremors. Negative for dizziness, seizures, syncope, facial asymmetry, speech difficulty, weakness, light-headedness, numbness and headaches.  Psychiatric/Behavioral: Positive for confusion. Negative for agitation, behavioral problems, dysphoric mood, self-injury and sleep disturbance.       Delusions    Immunization History  Administered Date(s) Administered  . Influenza Inj Mdck Quad Pf 06/16/2016  . Influenza,inj,Quad PF,6+ Mos 06/19/2018  . Influenza-Unspecified 06/18/2015, 06/22/2017  . Pneumococcal Conjugate-13 09/27/2014   Pertinent  Health Maintenance Due  Topic Date Due  . PNA  vac Low Risk Adult (2 of 2 - PPSV23) 08/29/2018 (Originally 09/28/2015)  . INFLUENZA VACCINE  Completed  . DEXA SCAN  Completed   Fall Risk  04/20/2018 02/20/2018 01/18/2018 12/07/2017 10/26/2017  Falls in the past year? No Yes No Yes No  Comment - - - - -  Number falls in past yr: - 1 - 2 or more -  Injury with Fall? - No - - -  Comment - - - - -  Risk Factor Category  - - - - -  Risk for fall due to : - - - - -   Functional Status Survey:    Vitals:   07/09/18 1123  Weight: 106 lb 3.2 oz (48.2 kg)   Body mass index is 20.07 kg/m.  Wt Readings from Last 3 Encounters:  07/09/18 106 lb 3.2 oz (48.2 kg)  06/20/18 102 lb (46.3 kg)  06/20/18 102 lb 9.6 oz (46.5 kg)   Physical Exam  Constitutional: She is oriented to person, place, and time. No distress.  HENT:  Head:  Normocephalic and atraumatic.  Neck: No JVD present.  Cardiovascular: Normal rate and regular rhythm.  No murmur heard. Pulmonary/Chest: Effort normal and breath sounds normal. No respiratory distress. She has no wheezes.  Abdominal: Soft. Bowel sounds are normal.  Musculoskeletal: She exhibits no edema, tenderness or deformity.  Decreased ROM of the neck but no pain or tenderness illicited  Neurological: She is alert and oriented to person, place, and time.  Skin: Skin is warm and dry. She is not diaphoretic.  Psychiatric: She has a normal mood and affect.  Nursing note and vitals reviewed.   Labs reviewed: Recent Labs    10/17/17 0600 04/16/18 0900 05/21/18  NA 140 139 138  K 4.4 4.7 5.8*  BUN 19 27* 20  CREATININE 1.0 1.3* 1.0   Recent Labs    09/25/17  AST 19  ALT 8  ALKPHOS 61   Recent Labs    09/25/17  WBC 7.8  HGB 13.0  HCT 40  PLT 201   Lab Results  Component Value Date   TSH 2.19 03/04/2016   No results found for: HGBA1C Lab Results  Component Value Date   CHOL 204 (A) 09/25/2017   HDL 51 09/25/2017   LDLCALC 134 09/25/2017   TRIG 91 09/25/2017    Significant Diagnostic Results in last 30 days:  No results found.  Assessment/Plan  1. Chronic constipation Continue senokot s 2 tabs bid Encourage fluids and fiber intake  2. Benign essential tremor Controlled Continue inderal 5 mg tid  3. Late onset Alzheimer's disease without behavioral disturbance (HCC) Continue Namenda 14 mg XR qd  4. Torticollis Continue outpt care with botox injections and scheduled tylenol  5. CKD (chronic kidney disease), stage IV (HCC) Continue to periodically monitor BMP and avoid nephrotoxic agents  6. Recurrent UTI Continue Keflex 250 mg qd   7. Major depressive disorder with single episode, in partial remission (HCC) Continue celexa 20 mg qd for this and OCD behaviors. Would not taper at this point as it may be de stabilizing for her  Family/ staff  Communication: staff/resident  Labs/tests ordered:  NA

## 2018-07-12 DIAGNOSIS — F333 Major depressive disorder, recurrent, severe with psychotic symptoms: Secondary | ICD-10-CM | POA: Diagnosis not present

## 2018-08-01 ENCOUNTER — Encounter: Payer: Self-pay | Admitting: Internal Medicine

## 2018-08-01 ENCOUNTER — Encounter: Payer: Medicare Other | Attending: Physical Medicine & Rehabilitation | Admitting: Physical Medicine & Rehabilitation

## 2018-08-01 ENCOUNTER — Encounter: Payer: Self-pay | Admitting: Physical Medicine & Rehabilitation

## 2018-08-01 VITALS — BP 90/55 | HR 68 | Resp 14 | Ht 61.0 in | Wt 102.0 lb

## 2018-08-01 DIAGNOSIS — R251 Tremor, unspecified: Secondary | ICD-10-CM | POA: Diagnosis not present

## 2018-08-01 DIAGNOSIS — G309 Alzheimer's disease, unspecified: Secondary | ICD-10-CM | POA: Insufficient documentation

## 2018-08-01 DIAGNOSIS — Z87891 Personal history of nicotine dependence: Secondary | ICD-10-CM | POA: Insufficient documentation

## 2018-08-01 DIAGNOSIS — M791 Myalgia, unspecified site: Secondary | ICD-10-CM | POA: Insufficient documentation

## 2018-08-01 DIAGNOSIS — M4722 Other spondylosis with radiculopathy, cervical region: Secondary | ICD-10-CM | POA: Diagnosis not present

## 2018-08-01 DIAGNOSIS — M531 Cervicobrachial syndrome: Secondary | ICD-10-CM

## 2018-08-01 DIAGNOSIS — F329 Major depressive disorder, single episode, unspecified: Secondary | ICD-10-CM | POA: Insufficient documentation

## 2018-08-01 DIAGNOSIS — F0280 Dementia in other diseases classified elsewhere without behavioral disturbance: Secondary | ICD-10-CM | POA: Insufficient documentation

## 2018-08-01 DIAGNOSIS — M8580 Other specified disorders of bone density and structure, unspecified site: Secondary | ICD-10-CM | POA: Insufficient documentation

## 2018-08-01 DIAGNOSIS — G243 Spasmodic torticollis: Secondary | ICD-10-CM | POA: Insufficient documentation

## 2018-08-01 DIAGNOSIS — M542 Cervicalgia: Secondary | ICD-10-CM | POA: Diagnosis not present

## 2018-08-01 DIAGNOSIS — R269 Unspecified abnormalities of gait and mobility: Secondary | ICD-10-CM | POA: Insufficient documentation

## 2018-08-01 DIAGNOSIS — G479 Sleep disorder, unspecified: Secondary | ICD-10-CM | POA: Diagnosis not present

## 2018-08-01 DIAGNOSIS — M25551 Pain in right hip: Secondary | ICD-10-CM

## 2018-08-01 DIAGNOSIS — M810 Age-related osteoporosis without current pathological fracture: Secondary | ICD-10-CM | POA: Diagnosis not present

## 2018-08-01 DIAGNOSIS — G8929 Other chronic pain: Secondary | ICD-10-CM | POA: Diagnosis not present

## 2018-08-01 DIAGNOSIS — F0390 Unspecified dementia without behavioral disturbance: Secondary | ICD-10-CM | POA: Diagnosis not present

## 2018-08-01 NOTE — Progress Notes (Signed)
Subjective:    Patient ID: Melanie Freeman, female    DOB: [birth year], 84 y.o.   MRN: [id number]  HPI 84 y/o female with pmh of dementia, cervical spondylosis with radiculopathy, alzheimer, depression/anxiety, OA presents for follow up for neck pain.   Initially stated: Daughter present, who provides some of history.  Started ~2017.  Denies inciting event.  Getting worse. Denies alleviating factors.  ROM exacerbates symptoms.  Non-radiating.  Constant for the most part.  Heat helps temporarily. PT/OT with no benefit.  Associated headaches and tingling.  Several falls due to balance.  She recently started using wheelchair.  Pain makes life uncomfortable.  Currently resides in ALF.    Last clinic visit 06/20/18.  Pt poor historian. Daughter supplements history. She had Botox at that time. More lethargic than usual today. No improvement with PT. Records from SNF reviewed. Exchanged communication with SNF between visits.  Unclear if TENS used.  Lidoderm patch with unknown benefit.    Pain Inventory Average Pain n/a Pain Right Now n/a My pain is constant and exacerbated with activity  In the last 24 hours, has pain interfered with the following? General activity n/a Relation with others n/a Enjoyment of life n/a What TIME of day is your pain at its worst? varies Sleep (in general) Fair  Pain is worse with: inactivity and unsure Pain improves with: heat/ice Relief from Meds: 0  Mobility ability to climb steps?  no do you drive?  no use a wheelchair needs help with transfers transfers alone Do you have any goals in this area?  no  Function retired I need assistance with the following:  dressing, bathing, toileting, meal prep, household duties and shopping  Neuro/Psych bowel control problems tremor trouble walking confusion  Prior Studies Any changes since last visit?  no  Physicians involved in your care Any changes since last visit?  no   Family History  Problem  Relation Age of Onset  . Dementia Mother   . Cancer Father        pancreatic  . Hepatitis Brother    Social History   Socioeconomic History  . Marital status: Widowed    Spouse name: Not on file  . Number of children: 2  . Years of education: 108  . Highest education level: Not on file  Occupational History  . Occupation: stay at home mom  Social Needs  . Financial resource strain: Not hard at all  . Food insecurity:    Worry: Never true    Inability: Never true  . Transportation needs:    Medical: No    Non-medical: No  Tobacco Use  . Smoking status: Former Smoker    Years: 5.00  . Smokeless tobacco: Never Used  Substance and Sexual Activity  . Alcohol use: No  . Drug use: No  . Sexual activity: Never  Lifestyle  . Physical activity:    Days per week: 0 days    Minutes per session: 0 min  . Stress: Only a little  Relationships  . Social connections:    Talks on phone: Twice a week    Gets together: Twice a week    Attends religious service: Never    Active member of club or organization: No    Attends meetings of clubs or organizations: Never    Relationship status: Widowed  Other Topics Concern  . Not on file  Social History Narrative   Lives at [facility] moved in 04/02/15   Widow   Never  smoked   Alcohol none   Exercise walking, leg exercise in bed twice daily    Walks w/ cane/walker   POA, Living Will   Left-handed   No caffeine   Past Surgical History:  Procedure Laterality Date  . APPENDECTOMY    . DILATATION & CURETTAGE/HYSTEROSCOPY WITH MYOSURE N/A 06/26/2015   Procedure: DILATATION & CURETTAGE/HYSTEROSCOPY WITH MYOSURE;  Surgeon: Arvella Nigh, MD;  Location: [facility];  Service: Gynecology;  Laterality: N/A;  . FRACTURE SURGERY  2015   left hip fracture  . HIP ORIF W/ CAPSULOTOMY Left 10/22/2013   [name]   . RIGHT hip percutaneous pinning of impacted femoral neck fracture  01/26/16   [facility]--Dr. Jaynee Eagles orthopedics  . TONSILLECTOMY      Past Medical History:  Diagnosis Date  . Allergic contact dermatitis   . Anxiety   . Arthritis   . Closed fracture of neck of femur ([place]) 01/25/2016  . Cystitis, interstitial   . Depression   . Femur fracture, right ([place]) 01/26/16  . Fracture of right inferior pubic ramus ([place]) 01/26/16  . Hepatitis    over 50 years ago  . Long-term use of high-risk medication   . Osteopenia   . Tremors of nervous system   . Vertigo    BP (!) 90/55   Pulse 68   Resp 14   Ht 5\' 1"  (1.549 m) Comment: previous  Wt 102 lb (46.3 kg) Comment: previous  SpO2 (!) 89%   BMI 19.27 kg/m   Opioid Risk Score:   Fall Risk Score:  `1  Depression screen PHQ 2/9  Depression screen [facility] 2/9 04/20/2018 02/20/2018 12/07/2017 08/24/2017 02/14/2017 04/29/2015  Decreased Interest 0 0 0 3 0 0  Down, Depressed, Hopeless 0 0 0 1 0 0  PHQ - 2 Score 0 0 0 4 0 0  Altered sleeping - - 0 2 - -  Tired, decreased energy - - 0 3 - -  Change in appetite - - 0 3 - -  Feeling bad or failure about yourself  - - 0 0 - -  Trouble concentrating - - 0 0 - -  Moving slowly or fidgety/restless - - 0 0 - -  Suicidal thoughts - - 0 0 - -  PHQ-9 Score - - 0 12 - -    Review of Systems  Constitutional: Negative.        Poor appetite  HENT: Negative.   Eyes: Negative.   Respiratory: Negative.   Cardiovascular: Negative.   Gastrointestinal: Positive for diarrhea.  Endocrine: Negative.   Genitourinary: Negative.   Musculoskeletal: Positive for arthralgias, gait problem, myalgias, neck pain and neck stiffness.  Skin: Positive for rash.  Allergic/Immunologic: Negative.   Neurological: Positive for tremors.  Hematological: Negative.   Psychiatric/Behavioral: Positive for confusion.  All other systems reviewed and are negative.     Objective:   Physical Exam Gen: NAD. Vital signs reviewed HENT: Normocephalic, Atraumatic Eyes: EOMI. No discharge.  Cardio: RRR. No JVD. Pulm: B/l cleat to auscultation.  Effort normal Abd:  Nondistended, BS+ MSK:     HOH  No TTP.    No edema.   Limitted ROM in neck L>R  Pain with right hip, knee ROM Neuro: Alert  Head/hand tremor Skin: Warm and Dry. Intact    Assessment & Plan:  84 y/o female with pmh of dementia, cervical spondylosis with radiculopathy, alzheimer, depression/anxiety, OA presents for follow up for neck pain.  1. Chronic neck pain  Multifactorial due  to spinal stensosis/DDD, and spasmodic torticollis - predominantly laterocollis   MRI from 09/13/2016 reviewed, showing C2-3 disc protrusion with spinal cord Impingement and disc degeneration C3-6.  Cont Heat   Baclofen causes change in mental status  Vit D WNL  Cont Voltaren gel  Cont Tizanidine 2mg  BID PRN  Will consider Gabapentin if necessary  ?Cont TENS  Cont PT  Previously injected:     right SCM injected 60U    right Trapezius 40U   Will hold off on injection- difficult to determine maximal benefit as pt cannot recall previous ROM or benefits, but per discussion and daughter and patient, appear to be improvements in ROM and pain, however, will hold off and evaluate  Will order Robaxin 250 BID PRN   2. Gait abnormality  Cont wheelchair for safety  Cont PT  Vitamin B12 WNL  3. Sleep disturbance  See #1  Improving  4. Myalgia   Will consider tigger point injections, however, I do not believe this is predominant etiology of pain, see #1  5. Tremors  In head and hands  Improvement with Propranolol 5 TID, will not increase due to resting heart rate  6. Joint pain in right hip and knee  S/p right hip nailing with necrosis in hip per daughter - hip arthroplasty recommended by Surg, but not recommended by PCP.  Follow up with Ortho for potential intervention.  Cont Voltaren gel  Cont Lidoderm patches  Trial TENS unit             Xray of knee, arthritic changes per report  7. Delayed processing  ?Didn't sleep well overnight  Encouraged monitoring at [facility]

## 2018-08-02 ENCOUNTER — Non-Acute Institutional Stay (SKILLED_NURSING_FACILITY): Payer: Medicare Other | Admitting: Adult Health

## 2018-08-02 ENCOUNTER — Encounter: Payer: Self-pay | Admitting: Adult Health

## 2018-08-02 DIAGNOSIS — Z79899 Other long term (current) drug therapy: Secondary | ICD-10-CM | POA: Diagnosis not present

## 2018-08-02 DIAGNOSIS — R531 Weakness: Secondary | ICD-10-CM | POA: Diagnosis not present

## 2018-08-02 DIAGNOSIS — R319 Hematuria, unspecified: Secondary | ICD-10-CM | POA: Diagnosis not present

## 2018-08-02 DIAGNOSIS — N390 Urinary tract infection, site not specified: Secondary | ICD-10-CM | POA: Diagnosis not present

## 2018-08-02 DIAGNOSIS — D649 Anemia, unspecified: Secondary | ICD-10-CM | POA: Diagnosis not present

## 2018-08-02 DIAGNOSIS — I951 Orthostatic hypotension: Secondary | ICD-10-CM

## 2018-08-02 DIAGNOSIS — R05 Cough: Secondary | ICD-10-CM | POA: Diagnosis not present

## 2018-08-02 NOTE — Progress Notes (Signed)
Location:  Occupational psychologist of Service:  SNF (31) Provider:   Cindi Carbon, ANP [place] [phone]   Gayland Curry, DO  Patient Care Team: Gayland Curry, DO as PCP - General (Geriatric Medicine) Bjorn Loser, MD as Consulting Physician (Urology)  Extended Emergency Contact Information Primary Emergency Contact: [facility] Address: [address]          [place], [place] [address] Johnnette Litter of [place] Work Phone: [phone] Mobile Phone: [phone] Relation: Daughter Secondary Emergency Contact: [facility] Address: [address]          [place], [place] [address] Johnnette Litter of [place] Mobile Phone: [phone] Relation: Other  Code Status:  DNR Goals of care: Advanced Directive information Advanced Directives 05/15/2018  Does Patient Have a Medical Advance Directive? Yes  Type of Paramedic of [place];Living will;Out of facility DNR (pink MOST or yellow form)  Does patient want to make changes to medical advance directive? No - Patient declined  Copy of [place] in Chart? Yes  Would patient like information on creating a medical advance directive? -  Pre-existing out of facility DNR order (yellow form or pink MOST form) Yellow form placed in chart (order not valid for inpatient use)     Chief Complaint  Patient presents with  . Acute Visit    weakness and lethargy    HPI:  Pt is a 84 y.o. female seen today for an acute visit for lethargy and weakness. She resides in skilled care and has underlying dementia and chronic pain to the right hip and knee. The staff report she was weaker starting on Monday 07/30/18 and needed more assistance. She became gradually weaker and required a lift for transfers and was incontinent. She went out to see the PMR doctor for botox injection related to torticollis as well as eval for leg weakness. Prn robaxin was recommended and  botox not given due to lack of benefit. In the parking lot at [facility] after her apt the nursing staff report that she was weak and had difficulty standing and was pale. VS were stable but were checked after she was in the supine position. This morning she did not eat breakfast and remained weak. BP was 70/48 but improved to 102/60 after being placed in bed. She remained weak, minimally talkative and pale. No cough, fever, decreased 02 sats, focal deficit or other findings reported.   Past Medical History:  Diagnosis Date  . Allergic contact dermatitis   . Anxiety   . Arthritis   . Closed fracture of neck of femur ([place]) 01/25/2016  . Cystitis, interstitial   . Depression   . Femur fracture, right ([place]) 01/26/16  . Fracture of right inferior pubic ramus ([place]) 01/26/16  . Hepatitis    over 50 years ago  . Long-term use of high-risk medication   . Osteopenia   . Tremors of nervous system   . Vertigo    Past Surgical History:  Procedure Laterality Date  . APPENDECTOMY    . DILATATION & CURETTAGE/HYSTEROSCOPY WITH MYOSURE N/A 06/26/2015   Procedure: DILATATION & CURETTAGE/HYSTEROSCOPY WITH MYOSURE;  Surgeon: Arvella Nigh, MD;  Location: [facility];  Service: Gynecology;  Laterality: N/A;  . FRACTURE SURGERY  2015   left hip fracture  . HIP ORIF W/ CAPSULOTOMY Left 10/22/2013   [name]   . RIGHT hip percutaneous pinning of impacted femoral neck fracture  01/26/16   [facility]--Dr. Jaynee Eagles orthopedics  .  TONSILLECTOMY      Allergies  Allergen Reactions  . Baclofen Other (See Comments)    Made her extremely confused, and falls  . Latex Rash  . Other Other (See Comments)    Pt reports was seen by neurologist for tremors and was allergic to all medications prescribed for tremors, but does not know names or reaction. Patient reports being allergic to polyester. Does not know what reaction would be. States she was seen by a doctor who tested her for multiple allergies and she had positive  results.  . Doxycycline Other (See Comments)    unknown  . Hydrocortisone   . Lopid [Gemfibrozil]   . Sulfa Antibiotics Other (See Comments)    unknown    Outpatient Encounter Medications as of 08/02/2018  Medication Sig  . acetaminophen (TYLENOL) 325 MG tablet Take 650 mg by mouth 2 (two) times daily.  . ARIPiprazole (ABILIFY) 2 MG tablet Take 2 mg by mouth daily.  . cephALEXin (KEFLEX) 250 MG capsule Take 250 mg by mouth daily before lunch.  . Cholecalciferol (D3 HIGH POTENCY) 1000 UNITS capsule Take 1,000 Units by mouth daily.   . citalopram (CELEXA) 20 MG tablet Take 20 mg by mouth daily.  . diclofenac sodium (VOLTAREN) 1 % GEL Apply 2 g topically 4 (four) times daily. Apply to right knee and hip  . fluocinolone (VANOS) 0.01 % cream Apply 1 application topically 2 (two) times daily as needed (skin rash).  . Liniments (SALONPAS PAIN RELIEF PATCH EX) Apply topically 2 (two) times daily.  . meloxicam (MOBIC) 7.5 MG tablet Take 7.5 mg by mouth every other day.   . memantine (NAMENDA XR) 14 MG CP24 24 hr capsule Take 14 mg by mouth daily.  . mirabegron ER (MYRBETRIQ) 50 MG TB24 tablet Take 50 mg by mouth daily.  [name] Glycol-Propyl Glycol (SYSTANE OP) Place 1 drop into both eyes 4 (four) times daily as needed.   . propranolol (INDERAL) 10 MG tablet Take 0.5 tablets (5 mg total) by mouth 3 (three) times daily.  . raloxifene (EVISTA) 60 MG tablet Take 60 mg by mouth daily.  Marland Kitchen senna-docusate (SENOKOT-S) 8.6-50 MG tablet Take 2 tablets by mouth 2 (two) times daily.  Marland Kitchen tiZANidine (ZANAFLEX) 4 MG tablet Take 0.5 tablets (2 mg total) by mouth 2 (two) times daily as needed for muscle spasms.  . vitamin B-12 (CYANOCOBALAMIN) 1000 MCG tablet Take 1,000 mcg by mouth every other day.   No facility-administered encounter medications on file as of 08/02/2018.     Review of Systems  Constitutional: Positive for activity change, appetite change and fatigue. Negative for chills, diaphoresis, fever  and unexpected weight change.  HENT: Negative for congestion, sore throat, trouble swallowing and voice change.   Eyes: Negative for photophobia and visual disturbance.  Respiratory: Negative for cough, shortness of breath and wheezing.   Cardiovascular: Negative for chest pain, palpitations and leg swelling.  Gastrointestinal: Negative for abdominal distention, abdominal pain, constipation and diarrhea.  Genitourinary: Negative for difficulty urinating and dysuria.       Incontinent  Musculoskeletal: Positive for arthralgias and gait problem. Negative for back pain, joint swelling and myalgias.  Neurological: Positive for tremors and weakness. Negative for dizziness, seizures, syncope, facial asymmetry, speech difficulty, light-headedness, numbness and headaches.  Psychiatric/Behavioral: Positive for confusion. Negative for agitation and behavioral problems.    Immunization History  Administered Date(s) Administered  . Influenza Inj Mdck Quad Pf 06/16/2016  . Influenza,inj,Quad PF,6+ Mos 06/19/2018  . Influenza-Unspecified 06/18/2015,  06/22/2017  . Pneumococcal Conjugate-13 09/27/2014   Pertinent  Health Maintenance Due  Topic Date Due  . PNA vac Low Risk Adult (2 of 2 - PPSV23) 08/29/2018 (Originally 09/28/2015)  . INFLUENZA VACCINE  Completed  . DEXA SCAN  Completed   Fall Risk  08/01/2018 04/20/2018 02/20/2018 01/18/2018 12/07/2017  Falls in the past year? 0 No Yes No Yes  Comment - - - - -  Number falls in past yr: - - 1 - 2 or more  Injury with Fall? - - No - -  Comment - - - - -  Risk Factor Category  - - - - -  Risk for fall due to : - - - - -   Functional Status Survey:    Vitals:   08/02/18 1232  BP: 102/60  Pulse: 63  Resp: 20  Temp: 98.6 F (37 C)  SpO2: 94%  Weight: 103 lb 14.4 oz (47.1 kg)   Body mass index is 19.63 kg/m. Physical Exam  Constitutional: No distress.  HENT:  Head: Normocephalic and atraumatic.  Right Ear: External ear normal.  Left Ear:  External ear normal.  Nose: Nose normal.  Mouth/Throat: Oropharynx is clear and moist. No oropharyngeal exudate.  Eyes: Pupils are equal, round, and reactive to light. Conjunctivae and EOM are normal. Right eye exhibits no discharge. Left eye exhibits no discharge.  Neck: No JVD present. No thyromegaly present.  Torticollis   Cardiovascular: Normal rate and regular rhythm.  No murmur heard. Pulmonary/Chest: Effort normal and breath sounds normal. No respiratory distress. She has no wheezes.  Abdominal: Soft. Bowel sounds are normal. She exhibits no distension. There is no tenderness.  Musculoskeletal: She exhibits tenderness (right knee and hip with chronic tenderness and pain with ROM. NO swelling, redness, or warmth). She exhibits no edema.  Lymphadenopathy:    She has no cervical adenopathy.  Neurological: No cranial nerve deficit.  Sleepy but arouses and follows commands to verbal stim. Generally weak but MAE equally.   Skin: Skin is warm and dry. She is not diaphoretic. There is pallor.  Psychiatric: She has a normal mood and affect.  Nursing note and vitals reviewed.   Labs reviewed: Recent Labs    10/17/17 0600 04/16/18 0900 05/21/18  NA 140 139 138  K 4.4 4.7 5.8*  BUN 19 27* 20  CREATININE 1.0 1.3* 1.0   Recent Labs    09/25/17  AST 19  ALT 8  ALKPHOS 61   Recent Labs    09/25/17  WBC 7.8  HGB 13.0  HCT 40  PLT 201   Lab Results  Component Value Date   TSH 2.19 03/04/2016   No results found for: HGBA1C Lab Results  Component Value Date   CHOL 204 (A) 09/25/2017   HDL 51 09/25/2017   LDLCALC 134 09/25/2017   TRIG 91 09/25/2017    Significant Diagnostic Results in last 30 days:  No results found.  Assessment/Plan  1. Orthostatic hypotension NS 100cc/hr x 1 liter.  Keep feet elevated and in bed.  Hold propranolol ?dehydration or underlying infection  Will check labs, UA and CXR   2. Weakness  Due to #1 No focal deficit or focal weakness   to suggest CVA. Symptoms started over 24 hrs ago and so she would be out of the window for acute treatment anyway.   I called and spoke with her daughter regarding her decline. She did not want to send her to the hospital due to her underlying  dementia and concern that the change in environment would exacerbate her mental status. I let her know that we are limited in scope with what we could do at [facility] but given Ms. Rabe dementia and chronic pain, aggressive care would not be appropriate. She verbalized understanding. We agreed to treat her for reversible conditions here at [facility].  She is a DNR but will need a most form filled out at the daughter's next visit.   Family/ staff Communication: staff and resident's daughter Eustaquio Maize  Labs/tests ordered:  CBC BMP CXR UA C AND S

## 2018-08-02 NOTE — ACP (Advance Care Planning) (Signed)
I called and spoke with her daughter regarding her decline. She did not want to send her to the hospital due to her underlying dementia and concern that the change in environment would exacerbate her mental status. I let her know that we are limited in scope with what we could do at [facility] but given Ms. Iannello dementia and chronic pain, aggressive care would not be appropriate. She verbalized understanding. We agreed to treat her for reversible conditions here at [facility].  She is a DNR but will need a most form filled out at the daughter's next visit.

## 2018-08-06 DIAGNOSIS — D649 Anemia, unspecified: Secondary | ICD-10-CM | POA: Diagnosis not present

## 2018-08-06 DIAGNOSIS — Z79899 Other long term (current) drug therapy: Secondary | ICD-10-CM | POA: Diagnosis not present

## 2018-08-07 ENCOUNTER — Encounter: Payer: Self-pay | Admitting: Internal Medicine

## 2018-08-07 ENCOUNTER — Non-Acute Institutional Stay (SKILLED_NURSING_FACILITY): Payer: Medicare Other | Admitting: Internal Medicine

## 2018-08-07 DIAGNOSIS — G250 Essential tremor: Secondary | ICD-10-CM | POA: Diagnosis not present

## 2018-08-07 DIAGNOSIS — N184 Chronic kidney disease, stage 4 (severe): Secondary | ICD-10-CM

## 2018-08-07 DIAGNOSIS — E43 Unspecified severe protein-calorie malnutrition: Secondary | ICD-10-CM

## 2018-08-07 DIAGNOSIS — N179 Acute kidney failure, unspecified: Secondary | ICD-10-CM | POA: Diagnosis not present

## 2018-08-07 DIAGNOSIS — F419 Anxiety disorder, unspecified: Secondary | ICD-10-CM

## 2018-08-07 DIAGNOSIS — F0280 Dementia in other diseases classified elsewhere without behavioral disturbance: Secondary | ICD-10-CM

## 2018-08-07 DIAGNOSIS — G8929 Other chronic pain: Secondary | ICD-10-CM

## 2018-08-07 DIAGNOSIS — M24561 Contracture, right knee: Secondary | ICD-10-CM | POA: Diagnosis not present

## 2018-08-07 DIAGNOSIS — J189 Pneumonia, unspecified organism: Secondary | ICD-10-CM

## 2018-08-07 DIAGNOSIS — M25551 Pain in right hip: Secondary | ICD-10-CM

## 2018-08-07 DIAGNOSIS — G301 Alzheimer's disease with late onset: Secondary | ICD-10-CM

## 2018-08-07 DIAGNOSIS — R627 Adult failure to thrive: Secondary | ICD-10-CM | POA: Diagnosis not present

## 2018-08-07 DIAGNOSIS — Z7189 Other specified counseling: Secondary | ICD-10-CM | POA: Diagnosis not present

## 2018-08-07 MED ORDER — LORAZEPAM 2 MG/ML PO CONC: 0.5000 mg | Freq: Four times a day (QID) | ORAL | 0 refills | Status: AC | PRN

## 2018-08-07 MED ORDER — PROPRANOLOL HCL 10 MG PO TABS: 5.0000 mg | ORAL_TABLET | Freq: Three times a day (TID) | ORAL | 1 refills | Status: AC

## 2018-08-07 MED ORDER — MORPHINE SULFATE (CONCENTRATE) 20 MG/ML PO SOLN: 5.0000 mg | Freq: Four times a day (QID) | ORAL | 0 refills | Status: AC

## 2018-08-07 NOTE — ACP (Advance Care Planning) (Signed)
Met with pt's daughter, Melanie Freeman and her Freeman today after I saw Melanie Freeman.  NP had already spoken with [facility] about her medical condition and they had established comfort care and not to send pt out to the hospital for care. She has had a DNR in place since at least her snf admission.  We reviewed again Melanie Freeman current state of health:  She has baseline dementia and has not been in good spirits overall since her move from [facility] to [facility].  She has had chronic pain in the right hip/knee that have been difficult to control without causing adverse effects on her kidney function.  She has recently developed an incidental pneumonia with weakness and failure to thrive (no classic symptoms).  Her intake has been quite poor at baseline and particularly abysmal since last week--this morning she's had less than 500cc of fluid and no food.  She'd had no significant output.  Yesterday she got up for 2 meals, but ate nothing.  She is lethargic and not herself.  She's being treated with abx of levaquin for a 7 day course starting 12/5 and initially got a 1 liter fluid bolus but IV was removed after resident was pulling on dressings.  She then did not drink much even when fluids were pushed orally over the weekend.  The renal failure noted pre-bolus did not improve much.    Melanie Freeman acknowledged that her mom is not doing well and was aware of the various individual problems she was having.  We reviewed that her prognosis is poor in her current condition and that I'm concerned she will not turn around despite the antibiotics b/c her intake remains so poor and, therefore, renal function will not get better.  Therefore, we needed to discuss goals of care and next steps for her.  We discussed and reviewed Melanie Freeman previously completed living will done in [facility] which was quite helpful for them.  It indicated that she would not want life-prolonging abx, fluids or feedings.  She did want pain relief which was her one concern today for  me.  With this in mind, the MOST form was completed as follows:   DNR Comfort measures No new antibiotics (but complete last two days of levaquin if remains able to take po) No more IV fluids, but po fluids and oral intake may be offered so she may choose to accept or decline as long as she's safely alert enough No tube feedings  Form was copied for scanning and original placed in her chart after [facility] signed and completed her sections. Nursing staff were updated.    Also, we went on to discuss her comfort which is most crucial:  We opted to try her on roxanol '5mg'$  every 6 hours scheduled for pain, stop the mobic due to the renal failure worsening with its use and likely better relief with morphine.  I also added ativan for anxiety and stopped the meds that no longer offer her benefit at this point:  namenda Xr, raloxifene, florastor, myrbetriq, keflex, b12 and vitamin D.  They wanted to restart her propranolol b/c the tremors were bothersome and we will avoid checking routine vitals to avoid alarm with hypotension.   We then discussed hospice care.  Melanie Freeman, Melanie Freeman, said hospice is a great program and was all in favor.  I explained the various services that hospice has the support they could provide for Melanie Freeman and the family overall.  They were agreeable.  Melanie Freeman asked about prognosis:  I estimated days to weeks, but explained this depends on her intake and will at this stage.  They have a cruise planned for the holiday and may or may not pursue this now.  Melanie Freeman was also going to call Melanie Freeman, Melanie Freeman son, and ask him to come visit. Melanie Freeman, pt's granddaughter, just visited over thanksgiving on break.    Time spent on ACP:  1 hour.

## 2018-08-07 NOTE — Progress Notes (Signed)
Location:  [facility] Room Number: 116 Place of Service:  SNF (31) Provider:  Kinzlie Harney L. Mariea Clonts, D.O., C.M.D.  Gayland Curry, DO  Patient Care Team: Gayland Curry, DO as PCP - General (Geriatric Medicine) Bjorn Loser, MD as Consulting Physician (Urology)  Extended Emergency Contact Information Primary Emergency Contact: [facility] Address: [address]          [place], [place] [address] Johnnette Litter of [place] Work Phone: [phone] Mobile Phone: [phone] Relation: Daughter Secondary Emergency Contact: [facility] Address: [address]          [place], [place] [address] Johnnette Litter of [place] Mobile Phone: [phone] Relation: Other  Code Status:  DNR Goals of care: Advanced Directive information Advanced Directives 08/07/2018  Does Patient Have a Medical Advance Directive? Yes  Type of Paramedic of [place];Living will;Out of facility DNR (pink MOST or yellow form)  Does patient want to make changes to medical advance directive? No - Patient declined  Copy of [place] in Chart? Yes - validated most recent copy scanned in chart (See row information)  Would patient like information on creating a medical advance directive? -  Pre-existing out of facility DNR order (yellow form or pink MOST form) Yellow form placed in chart (order not valid for inpatient use)   Chief Complaint  Patient presents with  . Acute Visit    failure to thrive  . Advance Care Planning    MOST form completion    HPI:  Pt is a 84 y.o. female with dementia with paranoid psychotic delusions, depression, chronic right hip pain, peripheral neuropathy, chronic constipation, and overactive bladder seen today for an acute visit for weakness with failure to thrive with recent pneumonia.  She's required a lift for transfers and now has contracture of her right knee.  She was hypotensive, minimally  conversive and pale when seen by NP 12/5.  It was thought that she'd not voided all day but turned out she had but it had not been recorded.  She was found to have acute renal failure with cr up to 1.67 from 1. She was treated for pneumonia with levaquin beginning 12/5 and completes her 7 day course after two more days.  She was given a liter fluid bolus, but IV was then d/cd prior to further investigations.  Fluids were pushed over the weekend, but this was not successful and cr was 1.5 yesterday, 12/9.  Pt's intake remains quite poor and she's not her interactive self.  I asked the supervisor to contact her daughter so we could discuss her MOST form and update her goals of care with these latest events.  Pt had been going out for PMR visits for pain mgt and has been on mobic qod and robaxin for her torticollis and right knee and hip pain.  She continues to have significant pain.     When seen today, she was lethargic, weak and tremulous, appeared pale and almost gray in color.  She was turned on her left side in bed facing the wall, grabbing her rail.  She just c/o right hip/thigh and knee pain.  Staff again mentioned that she no longer even tries to straighten the right leg and c/o pain even at rest when it is not touched.  She also did not turn her head or even open her eyes to see me today.  She is not coughing and sats are normal.    Past Medical History:  Diagnosis  Date  . Allergic contact dermatitis   . Anxiety   . Arthritis   . Closed fracture of neck of femur ([place]) 01/25/2016  . Cystitis, interstitial   . Depression   . Femur fracture, right ([place]) 01/26/16  . Fracture of right inferior pubic ramus ([place]) 01/26/16  . Hepatitis    over 50 years ago  . Long-term use of high-risk medication   . Osteopenia   . Tremors of nervous system   . Vertigo    Past Surgical History:  Procedure Laterality Date  . APPENDECTOMY    . DILATATION & CURETTAGE/HYSTEROSCOPY WITH MYOSURE N/A 06/26/2015    Procedure: DILATATION & CURETTAGE/HYSTEROSCOPY WITH MYOSURE;  Surgeon: Arvella Nigh, MD;  Location: [facility];  Service: Gynecology;  Laterality: N/A;  . FRACTURE SURGERY  2015   left hip fracture  . HIP ORIF W/ CAPSULOTOMY Left 10/22/2013   [name]   . RIGHT hip percutaneous pinning of impacted femoral neck fracture  01/26/16   [facility]--Dr. Jaynee Eagles orthopedics  . TONSILLECTOMY      Allergies  Allergen Reactions  . Baclofen Other (See Comments)    Made her extremely confused, and falls  . Latex Rash  . Other Other (See Comments)    Pt reports was seen by neurologist for tremors and was allergic to all medications prescribed for tremors, but does not know names or reaction. Patient reports being allergic to polyester. Does not know what reaction would be. States she was seen by a doctor who tested her for multiple allergies and she had positive results.  . Doxycycline Other (See Comments)    unknown  . Hydrocortisone   . Lopid [Gemfibrozil]   . Sulfa Antibiotics Other (See Comments)    unknown    Outpatient Encounter Medications as of 08/07/2018  Medication Sig  . acetaminophen (TYLENOL) 325 MG tablet Take 650 mg by mouth 2 (two) times daily.  . ARIPiprazole (ABILIFY) 2 MG tablet Take 2 mg by mouth daily.  . cephALEXin (KEFLEX) 250 MG capsule Take 250 mg by mouth daily before lunch.  . Cholecalciferol (D3 HIGH POTENCY) 1000 UNITS capsule Take 1,000 Units by mouth daily.   . citalopram (CELEXA) 20 MG tablet Take 20 mg by mouth daily.  . diclofenac sodium (VOLTAREN) 1 % GEL Apply 2 g topically 4 (four) times daily. Apply to right knee and hip  . fluocinolone (VANOS) 0.01 % cream Apply 1 application topically 2 (two) times daily as needed (skin rash).  Marland Kitchen levofloxacin (LEVAQUIN) 500 MG tablet Take 500 mg by mouth daily.  . Liniments (SALONPAS PAIN RELIEF PATCH EX) Apply topically 2 (two) times daily.  . meloxicam (MOBIC) 7.5 MG tablet Take 7.5 mg by mouth every other day.   .  memantine (NAMENDA XR) 14 MG CP24 24 hr capsule Take 14 mg by mouth daily.  . methocarbamol (ROBAXIN) 500 MG tablet Take 250 mg by mouth 2 (two) times daily as needed for muscle spasms.  . miconazole (MONISTAT 7 SIMPLY CURE) 2 % vaginal cream Place 1 Applicatorful vaginally at bedtime.  . mirabegron ER (MYRBETRIQ) 50 MG TB24 tablet Take 50 mg by mouth daily.  [name] Glycol-Propyl Glycol (SYSTANE OP) Place 1 drop into both eyes 4 (four) times daily as needed.   . raloxifene (EVISTA) 60 MG tablet Take 60 mg by mouth daily.  Marland Kitchen saccharomyces boulardii (FLORASTOR) 250 MG capsule Take 250 mg by mouth 2 (two) times daily.  Marland Kitchen senna-docusate (SENOKOT-S) 8.6-50 MG tablet Take 2 tablets by  mouth 2 (two) times daily.  Marland Kitchen tiZANidine (ZANAFLEX) 4 MG tablet Take 0.5 tablets (2 mg total) by mouth 2 (two) times daily as needed for muscle spasms.  . vitamin B-12 (CYANOCOBALAMIN) 1000 MCG tablet Take 1,000 mcg by mouth every other day.  . [DISCONTINUED] propranolol (INDERAL) 10 MG tablet Take 0.5 tablets (5 mg total) by mouth 3 (three) times daily.   No facility-administered encounter medications on file as of 08/07/2018.     Review of Systems  Unable to perform ROS: Dementia (decreased responsiveness, pt ill; see hpi for more info)    Immunization History  Administered Date(s) Administered  . Influenza Inj Mdck Quad Pf 06/16/2016  . Influenza,inj,Quad PF,6+ Mos 06/19/2018  . Influenza-Unspecified 06/18/2015, 06/22/2017  . Pneumococcal Conjugate-13 09/27/2014   Pertinent  Health Maintenance Due  Topic Date Due  . PNA vac Low Risk Adult (2 of 2 - PPSV23) 08/29/2018 (Originally 09/28/2015)  . INFLUENZA VACCINE  Completed  . DEXA SCAN  Completed   Fall Risk  08/01/2018 04/20/2018 02/20/2018 01/18/2018 12/07/2017  Falls in the past year? 0 No Yes No Yes  Comment - - - - -  Number falls in past yr: - - 1 - 2 or more  Injury with Fall? - - No - -  Comment - - - - -  Risk Factor Category  - - - - -  Risk  for fall due to : - - - - -   Functional Status Survey:    Vitals:   08/07/18 1117  BP: 117/65  Pulse: 78  Resp: 16  Temp: 98.7 F (37.1 C)  TempSrc: Oral  SpO2: 96%  Weight: 103 lb (46.7 kg)  Height: _0  (1.549 m)   Body mass index is 19.46 kg/m. Physical Exam  Constitutional: She appears distressed.  Frail, cachectic, ashy appearing female in fetal position in her bed grasping siderail and tremulous  HENT:  Head: Normocephalic and atraumatic.  Cardiovascular: Normal rate, regular rhythm, normal heart sounds and intact distal pulses.  Pulmonary/Chest: Effort normal and breath sounds normal. No respiratory distress. She has no wheezes. She has no rales.  Musculoskeletal:  Unable to straighten right leg and yells in pain with light touch of hip/thigh/right knee; also has torticollis   Neurological:  Lethargic, did not even open eyes for me; resting tremor of head and hands  Skin: Skin is warm and dry. There is pallor.  Gray appearance  Psychiatric:  Very down affect today    Labs reviewed: Recent Labs    10/17/17 0600 04/16/18 0900 05/21/18  NA 140 139 138  K 4.4 4.7 5.8*  BUN 19 27* 20  CREATININE 1.0 1.3* 1.0   Recent Labs    09/25/17  AST 19  ALT 8  ALKPHOS 61   Recent Labs    09/25/17  WBC 7.8  HGB 13.0  HCT 40  PLT 201   Lab Results  Component Value Date   TSH 2.19 03/04/2016   No results found for: HGBA1C Lab Results  Component Value Date   CHOL 204 (A) 09/25/2017   HDL 51 09/25/2017   LDLCALC 134 09/25/2017   TRIG 91 09/25/2017    Assessment/Plan 1. HCAP (healthcare-associated pneumonia) -will finish levaquin as planned -offer beverages and food as she desires, ok if she refuses  2. Failure to thrive in adult -has been ongoing, but much worse in past week with this infection -hospice referral placed  3. Severe protein-calorie malnutrition ([place]) -from poor intake  4.  Late onset Alzheimer's disease without behavioral  disturbance ([place]) -has been moderate and has had paranoid delusions and hallucinations at times -will stop namenda due to lack of benefit at this point  5. Acute renal failure superimposed on stage 4 chronic kidney disease, unspecified acute renal failure type ([place]) -due to combination of poor intake and nsaid use -kidneys seem to be shutting down  6. Chronic right hip pain -severe and has not responded adequately to mobic, tylenol, topicals, muscle relaxers; has had some pain since a prior fx and surgery before she moved here  7. Contracture of right knee -new more recently, is on muscle relaxers from PMR, continued these  8. Benign essential tremor -restart propranolol for comfort purposes  9. ACP (advance care planning) Last ACP Note 05/09/2018 to 08/07/2018       ACP (Advance Care Planning) by Gayland Curry, DO at 08/07/2018 11:17 AM    Date of Service   Author Author Type Status Note Type File Time  08/07/2018 Addend Gayland Curry, DO Physician Signed ACP (Advance Care Planning) 08/07/2018             Met with pt's daughter, Ursula Beath and her husband today after I saw Mrs. Meras.  NP had already spoken with [facility] about her medical condition and they had established comfort care and not to send pt out to the hospital for care. She has had a DNR in place since at least her snf admission.  We reviewed again Mrs. Guadarrama current state of health:  She has baseline dementia and has not been in good spirits overall since her move from [facility] to [facility].  She has had chronic pain in the right hip/knee that have been difficult to control without causing adverse effects on her kidney function.  She has recently developed an incidental pneumonia with weakness and failure to thrive (no classic symptoms).  Her intake has been quite poor at baseline and particularly abysmal since last week--this morning she's had less than 500cc of fluid and no food.  She'd had no significant output.  Yesterday she got  up for 2 meals, but ate nothing.  She is lethargic and not herself.  She's being treated with abx of levaquin for a 7 day course starting 12/5 and initially got a 1 liter fluid bolus but IV was removed after resident was pulling on dressings.  She then did not drink much even when fluids were pushed orally over the weekend.  The renal failure noted pre-bolus did not improve much.    Beth acknowledged that her mom is not doing well and was aware of the various individual problems she was having.  We reviewed that her prognosis is poor in her current condition and that I'm concerned she will not turn around despite the antibiotics b/c her intake remains so poor and, therefore, renal function will not get better.  Therefore, we needed to discuss goals of care and next steps for her.  We discussed and reviewed Mrs. Winiarski previously completed living will done in [facility] which was quite helpful for them.  It indicated that she would not want life-prolonging abx, fluids or feedings.  She did want pain relief which was her one concern today for me.  With this in mind, the MOST form was completed as follows:   DNR Comfort measures No new antibiotics (but complete last two days of levaquin if remains able to take po) No more IV fluids, but po fluids and oral intake may  be offered so she may choose to accept or decline as long as she's safely alert enough No tube feedings  Form was copied for scanning and original placed in her chart after [facility] signed and completed her sections. Nursing staff were updated.    Also, we went on to discuss her comfort which is most crucial:  We opted to try her on roxanol 60m every 6 hours scheduled for pain, stop the mobic due to the renal failure worsening with its use and likely better relief with morphine.  I also added ativan for anxiety and stopped the meds that no longer offer her benefit at this point:  namenda Xr, raloxifene, florastor, myrbetriq, keflex, b12 and vitamin D.  They  wanted to restart her propranolol b/c the tremors were bothersome and we will avoid checking routine vitals to avoid alarm with hypotension.   We then discussed hospice care.  [place] Dy Beth's husband, said hospice is a great program and was all in favor.  I explained the various services that hospice has the support they could provide for Beth and the family overall.  They were agreeable.  Beth asked about prognosis:  I estimated days to weeks, but explained this depends on her intake and will at this stage.  They have a cruise planned for the holiday and may or may not pursue this now.  KL[place]ada Dywas also going to call TSherren Mocha Mrs. KBlizzardson, and ask him to come visit. ERaquel Sarna pt's granddaughter, just visited over thanksgiving on break.    Time spent on ACP:  1 hour.              More notes were found than are currently displayed.       Family/ staff Communication: see ACP note above  Labs/tests ordered:  No further labs  Lorna Strother L. Seraj Dunnam, D.O. [facility] [address] [place] [id number] Phone (Mon-Fri 8am-5pm):  [phone] Call:  [phone]& follow prompts after 5pm & weekends Office Phone:  [phone] Fax:  [phone]

## 2018-08-08 DIAGNOSIS — F22 Delusional disorders: Secondary | ICD-10-CM | POA: Diagnosis not present

## 2018-08-08 DIAGNOSIS — R000 Tachycardia, unspecified: Secondary | ICD-10-CM | POA: Diagnosis not present

## 2018-08-08 DIAGNOSIS — L239 Allergic contact dermatitis, unspecified cause: Secondary | ICD-10-CM | POA: Diagnosis not present

## 2018-08-08 DIAGNOSIS — M436 Torticollis: Secondary | ICD-10-CM | POA: Diagnosis not present

## 2018-08-08 DIAGNOSIS — J189 Pneumonia, unspecified organism: Secondary | ICD-10-CM | POA: Diagnosis not present

## 2018-08-08 DIAGNOSIS — N184 Chronic kidney disease, stage 4 (severe): Secondary | ICD-10-CM | POA: Diagnosis not present

## 2018-08-08 DIAGNOSIS — H04129 Dry eye syndrome of unspecified lacrimal gland: Secondary | ICD-10-CM | POA: Diagnosis not present

## 2018-08-08 DIAGNOSIS — R251 Tremor, unspecified: Secondary | ICD-10-CM | POA: Diagnosis not present

## 2018-08-08 DIAGNOSIS — F0281 Dementia in other diseases classified elsewhere with behavioral disturbance: Secondary | ICD-10-CM | POA: Diagnosis not present

## 2018-08-08 DIAGNOSIS — E46 Unspecified protein-calorie malnutrition: Secondary | ICD-10-CM | POA: Diagnosis not present

## 2018-08-08 DIAGNOSIS — M1990 Unspecified osteoarthritis, unspecified site: Secondary | ICD-10-CM | POA: Diagnosis not present

## 2018-08-08 DIAGNOSIS — F329 Major depressive disorder, single episode, unspecified: Secondary | ICD-10-CM | POA: Diagnosis not present

## 2018-08-09 DIAGNOSIS — F0281 Dementia in other diseases classified elsewhere with behavioral disturbance: Secondary | ICD-10-CM | POA: Diagnosis not present

## 2018-08-09 DIAGNOSIS — E46 Unspecified protein-calorie malnutrition: Secondary | ICD-10-CM | POA: Diagnosis not present

## 2018-08-09 DIAGNOSIS — J189 Pneumonia, unspecified organism: Secondary | ICD-10-CM | POA: Diagnosis not present

## 2018-08-09 DIAGNOSIS — R000 Tachycardia, unspecified: Secondary | ICD-10-CM | POA: Diagnosis not present

## 2018-08-09 DIAGNOSIS — N184 Chronic kidney disease, stage 4 (severe): Secondary | ICD-10-CM | POA: Diagnosis not present

## 2018-08-09 DIAGNOSIS — F22 Delusional disorders: Secondary | ICD-10-CM | POA: Diagnosis not present

## 2018-08-29 DEATH — deceased

## 2018-09-12 ENCOUNTER — Ambulatory Visit: Payer: Medicare Other | Admitting: Physical Medicine & Rehabilitation
